# Patient Record
Sex: Female | Born: 1945 | Race: Black or African American | Hispanic: No | Marital: Married | Smoking: Never smoker
Health system: Southern US, Community
[De-identification: ages and names within clinical notes are randomized; demographics above are authoritative.]

## PROBLEM LIST (undated history)

## (undated) DIAGNOSIS — I10 Essential (primary) hypertension: Secondary | ICD-10-CM

## (undated) DIAGNOSIS — D75839 Thrombocytosis, unspecified: Secondary | ICD-10-CM

## (undated) DIAGNOSIS — I4819 Other persistent atrial fibrillation: Secondary | ICD-10-CM

## (undated) DIAGNOSIS — D473 Essential (hemorrhagic) thrombocythemia: Secondary | ICD-10-CM

## (undated) DIAGNOSIS — E785 Hyperlipidemia, unspecified: Secondary | ICD-10-CM

## (undated) DIAGNOSIS — I639 Cerebral infarction, unspecified: Secondary | ICD-10-CM

## (undated) HISTORY — DX: Essential (primary) hypertension: I10

## (undated) HISTORY — DX: Hyperlipidemia, unspecified: E78.5

## (undated) HISTORY — DX: Thrombocytosis, unspecified: D75.839

## (undated) HISTORY — DX: Other persistent atrial fibrillation: I48.19

## (undated) HISTORY — DX: Cerebral infarction, unspecified: I63.9

---

## 2005-10-04 ENCOUNTER — Emergency Department: Payer: Self-pay | Admitting: Emergency Medicine

## 2019-09-30 ENCOUNTER — Observation Stay (HOSPITAL_BASED_OUTPATIENT_CLINIC_OR_DEPARTMENT_OTHER)
Admit: 2019-09-30 | Discharge: 2019-09-30 | Disposition: A | Discharge status: Home / Self Care | Payer: Medicare Other | Attending: Nurse Practitioner | Admitting: Nurse Practitioner

## 2019-09-30 ENCOUNTER — Observation Stay: Payer: Medicare Other

## 2019-09-30 ENCOUNTER — Observation Stay
Admission: EM | Admit: 2019-09-30 | Discharge: 2019-10-01 | Disposition: A | Discharge status: Home / Self Care | Payer: Medicare Other | Attending: Internal Medicine | Admitting: Internal Medicine

## 2019-09-30 ENCOUNTER — Emergency Department: Payer: Medicare Other

## 2019-09-30 DIAGNOSIS — R55 Syncope and collapse: Secondary | ICD-10-CM

## 2019-09-30 DIAGNOSIS — D696 Thrombocytopenia, unspecified: Secondary | ICD-10-CM | POA: Insufficient documentation

## 2019-09-30 DIAGNOSIS — R778 Other specified abnormalities of plasma proteins: Secondary | ICD-10-CM

## 2019-09-30 DIAGNOSIS — D473 Essential (hemorrhagic) thrombocythemia: Secondary | ICD-10-CM | POA: Diagnosis not present

## 2019-09-30 DIAGNOSIS — R748 Abnormal levels of other serum enzymes: Secondary | ICD-10-CM | POA: Diagnosis not present

## 2019-09-30 DIAGNOSIS — G459 Transient cerebral ischemic attack, unspecified: Secondary | ICD-10-CM

## 2019-09-30 DIAGNOSIS — R531 Weakness: Secondary | ICD-10-CM | POA: Diagnosis not present

## 2019-09-30 DIAGNOSIS — I4891 Unspecified atrial fibrillation: Secondary | ICD-10-CM | POA: Insufficient documentation

## 2019-09-30 DIAGNOSIS — I10 Essential (primary) hypertension: Secondary | ICD-10-CM | POA: Diagnosis not present

## 2019-09-30 DIAGNOSIS — Z79899 Other long term (current) drug therapy: Secondary | ICD-10-CM | POA: Diagnosis not present

## 2019-09-30 DIAGNOSIS — I639 Cerebral infarction, unspecified: Principal | ICD-10-CM | POA: Insufficient documentation

## 2019-09-30 DIAGNOSIS — I340 Nonrheumatic mitral (valve) insufficiency: Secondary | ICD-10-CM

## 2019-09-30 DIAGNOSIS — R42 Dizziness and giddiness: Secondary | ICD-10-CM | POA: Diagnosis present

## 2019-09-30 DIAGNOSIS — Z20822 Contact with and (suspected) exposure to covid-19: Secondary | ICD-10-CM | POA: Insufficient documentation

## 2019-09-30 DIAGNOSIS — D75839 Thrombocytosis, unspecified: Secondary | ICD-10-CM

## 2019-09-30 LAB — ECHOCARDIOGRAM COMPLETE
AR max vel: 2.7 cm2
AV Area VTI: 2.6 cm2
AV Area mean vel: 2.48 cm2
AV Mean grad: 2 mmHg
AV Peak grad: 3.9 mmHg
Ao pk vel: 0.98 m/s
Area-P 1/2: 4.8 cm2
Height: 63 in
S' Lateral: 2.87 cm
Weight: 2672 oz

## 2019-09-30 LAB — CBC WITH DIFFERENTIAL/PLATELET
Abs Immature Granulocytes: 0.05 10*3/uL (ref 0.00–0.07)
Basophils Absolute: 0 10*3/uL (ref 0.0–0.1)
Basophils Relative: 0 %
Eosinophils Absolute: 0.2 10*3/uL (ref 0.0–0.5)
Eosinophils Relative: 3 %
HCT: 40.9 % (ref 36.0–46.0)
Hemoglobin: 13.6 g/dL (ref 12.0–15.0)
Immature Granulocytes: 1 %
Lymphocytes Relative: 15 %
Lymphs Abs: 1.3 10*3/uL (ref 0.7–4.0)
MCH: 29.8 pg (ref 26.0–34.0)
MCHC: 33.3 g/dL (ref 30.0–36.0)
MCV: 89.7 fL (ref 80.0–100.0)
Monocytes Absolute: 0.7 10*3/uL (ref 0.1–1.0)
Monocytes Relative: 8 %
Neutro Abs: 6.7 10*3/uL (ref 1.7–7.7)
Neutrophils Relative %: 73 %
Platelets: 1057 10*3/uL (ref 150–400)
RBC: 4.56 MIL/uL (ref 3.87–5.11)
RDW: 15.6 % — ABNORMAL HIGH (ref 11.5–15.5)
Smear Review: INCREASED
WBC: 9.1 10*3/uL (ref 4.0–10.5)
nRBC: 0 % (ref 0.0–0.2)

## 2019-09-30 LAB — COMPREHENSIVE METABOLIC PANEL
ALT: 24 U/L (ref 0–44)
AST: 28 U/L (ref 15–41)
Albumin: 4 g/dL (ref 3.5–5.0)
Alkaline Phosphatase: 80 U/L (ref 38–126)
Anion gap: 8 (ref 5–15)
BUN: 17 mg/dL (ref 8–23)
CO2: 26 mmol/L (ref 22–32)
Calcium: 9.5 mg/dL (ref 8.9–10.3)
Chloride: 106 mmol/L (ref 98–111)
Creatinine, Ser: 0.87 mg/dL (ref 0.44–1.00)
GFR calc Af Amer: >60 mL/min (ref >60)
GFR calc non Af Amer: >60 mL/min (ref >60)
Glucose, Bld: 117 mg/dL — ABNORMAL HIGH (ref 70–99)
Potassium: 4.2 mmol/L (ref 3.5–5.1)
Sodium: 140 mmol/L (ref 135–145)
Total Bilirubin: 1.4 mg/dL — ABNORMAL HIGH (ref 0.3–1.2)
Total Protein: 7.1 g/dL (ref 6.5–8.1)

## 2019-09-30 LAB — LACTATE DEHYDROGENASE: LDH: 375 U/L — ABNORMAL HIGH (ref 98–192)

## 2019-09-30 LAB — TROPONIN I (HIGH SENSITIVITY)
Troponin I (High Sensitivity): 19 ng/L — ABNORMAL HIGH (ref <18)
Troponin I (High Sensitivity): 21 ng/L — ABNORMAL HIGH (ref <18)
Troponin I (High Sensitivity): 27 ng/L — ABNORMAL HIGH (ref <18)
Troponin I (High Sensitivity): 31 ng/L — ABNORMAL HIGH (ref <18)

## 2019-09-30 LAB — SARS CORONAVIRUS 2 BY RT PCR (HOSPITAL ORDER, PERFORMED IN ~~LOC~~ HOSPITAL LAB): SARS Coronavirus 2: NEGATIVE

## 2019-09-30 LAB — MAGNESIUM: Magnesium: 2 mg/dL (ref 1.7–2.4)

## 2019-09-30 LAB — PATHOLOGIST SMEAR REVIEW

## 2019-09-30 LAB — TSH: TSH: 2.712 u[IU]/mL (ref 0.350–4.500)

## 2019-09-30 IMAGING — CT CT HEAD W/O CM
3 series · 16 of 46 positions shown, 19 images · non-contrast
Comparison: None.

CLINICAL DATA: TIA.  Syncopal episode today.

EXAM:
CT HEAD WITHOUT CONTRAST
TECHNIQUE: Contiguous axial images were obtained from the base of the skull
through the vertex without intravenous contrast.

[Series 3: head wo · axial · 0.43mm/px · z∈[-108,+12]mm · 10 of 29 slices shown, 13 images]
[im 3/29  brain]
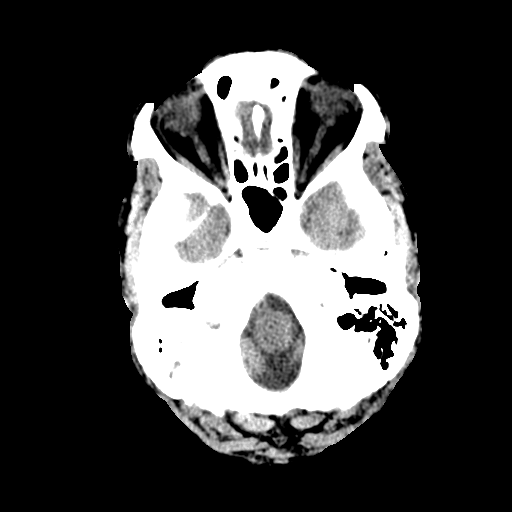
[im 3/29  bone]
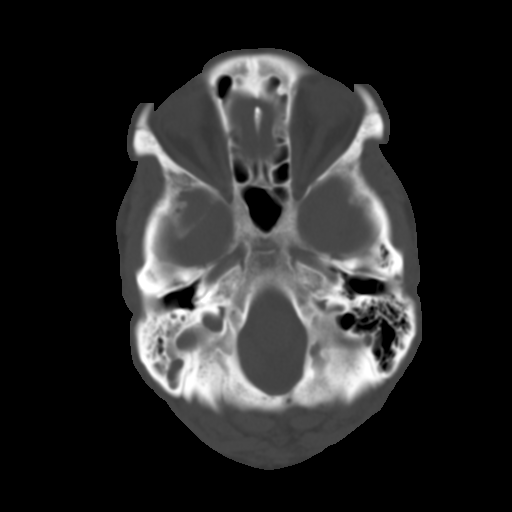
[im 6/29  brain]
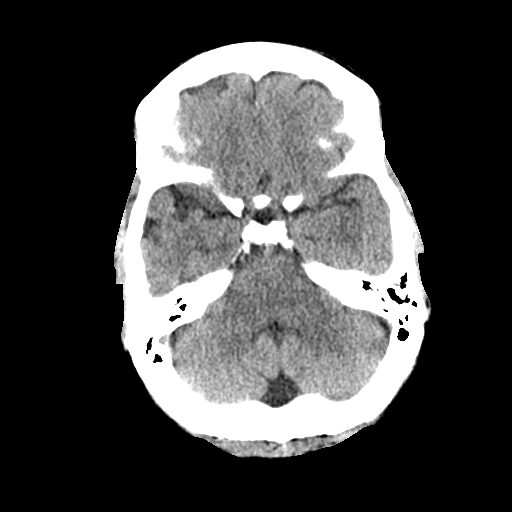
[im 8/29  brain]
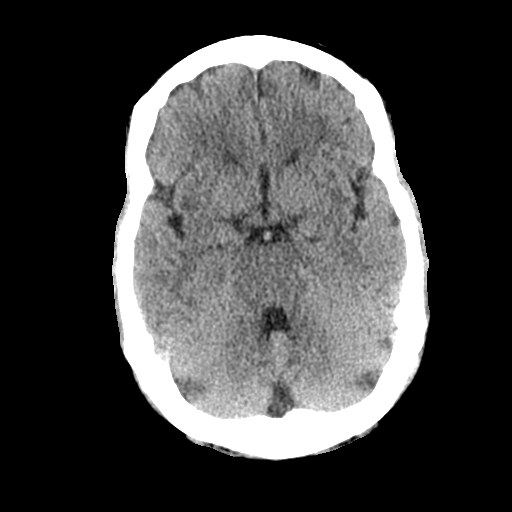
[im 11/29  brain]
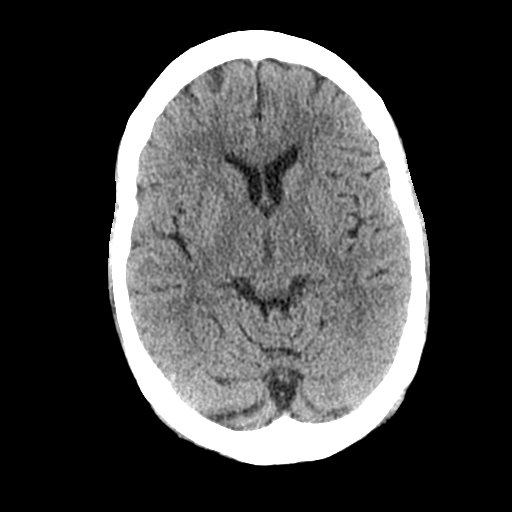
[im 14/29  brain]
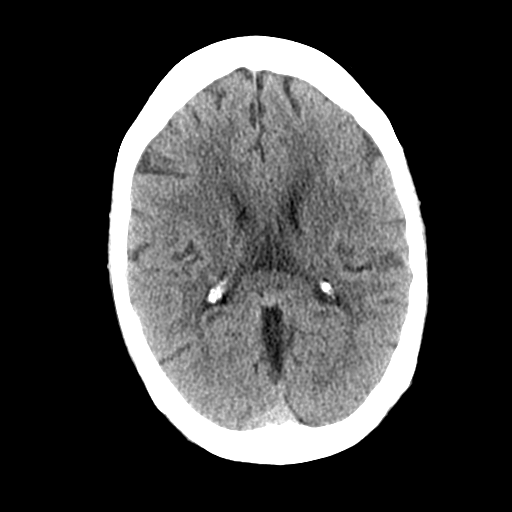
[im 14/29  bone]
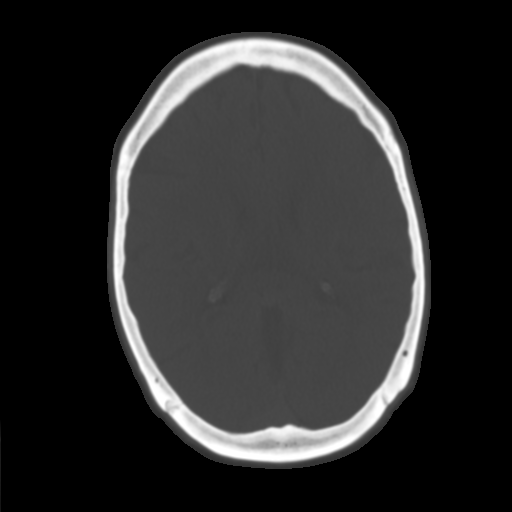
[im 16/29  brain]
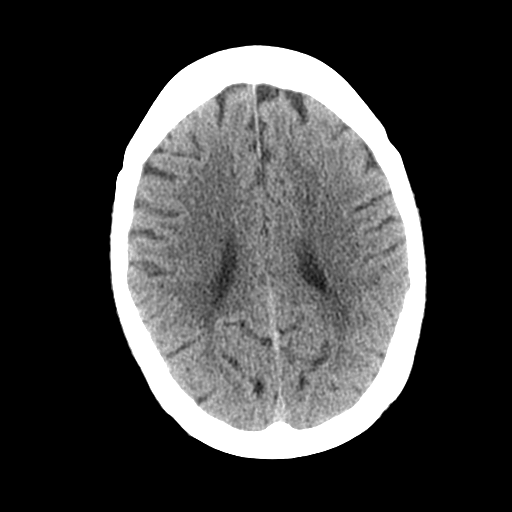
[im 19/29  brain]
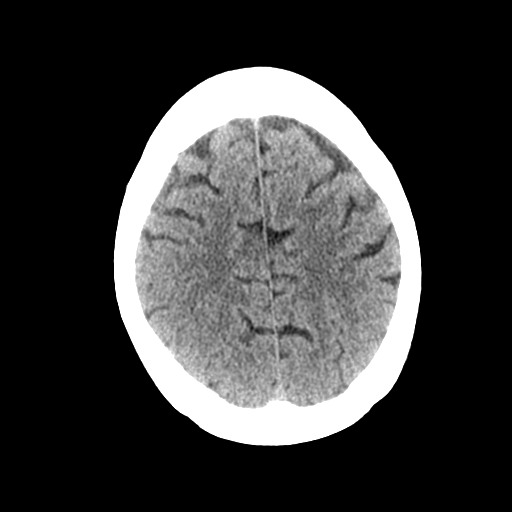
[im 22/29  brain]
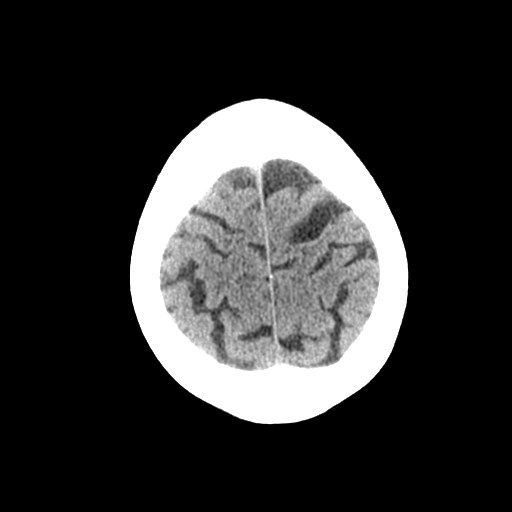
[im 24/29  brain]
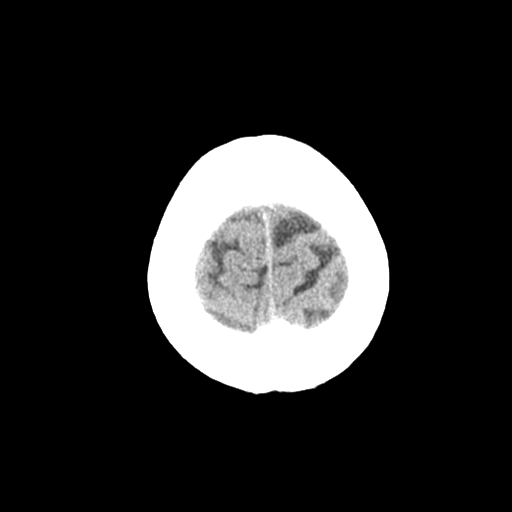
[im 24/29  bone]
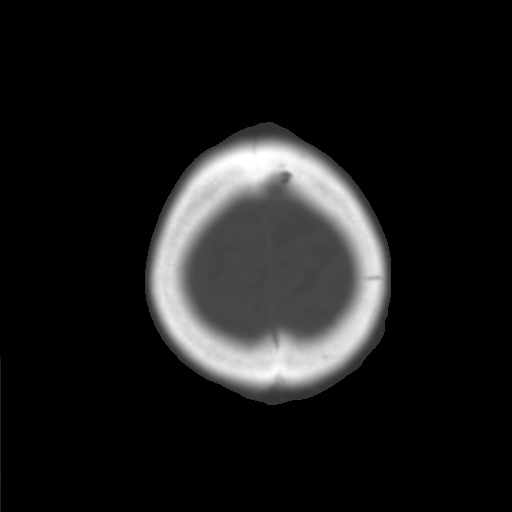
[im 27/29  brain]
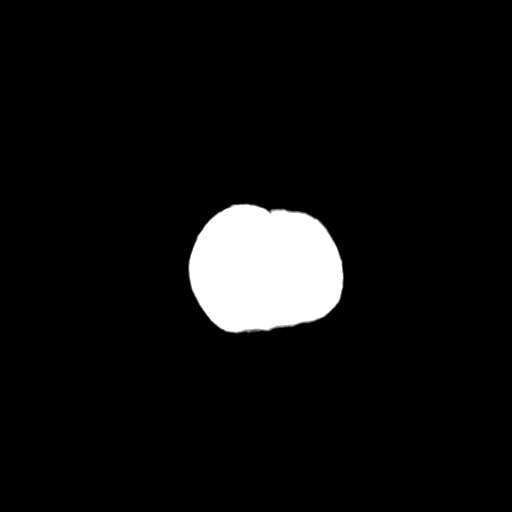

[Series 4: coronal soft tissue · coronal · 0.30mm/px · 3 of 65 slices shown]
[im 22/65  brain]
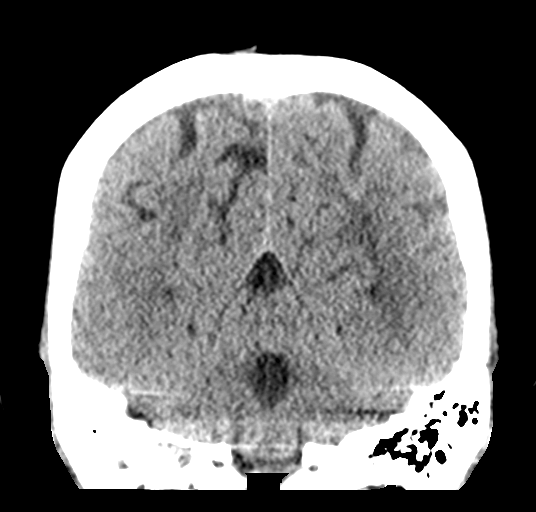
[im 29/65  brain]
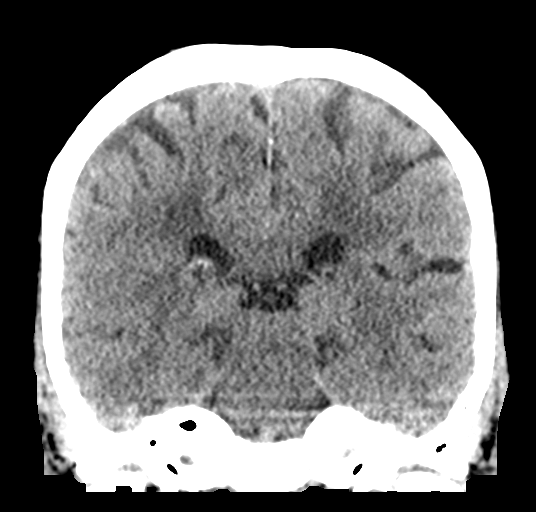
[im 36/65  brain]
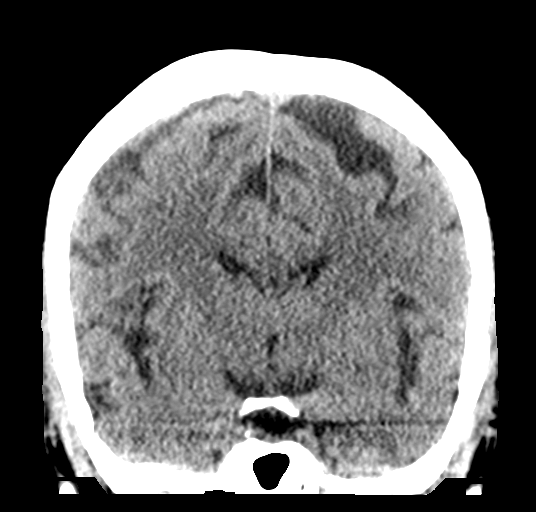

[Series 5: sagittal soft tissue · sagittal · 0.30mm/px · 3 of 51 slices shown]
[im 17/51  brain]
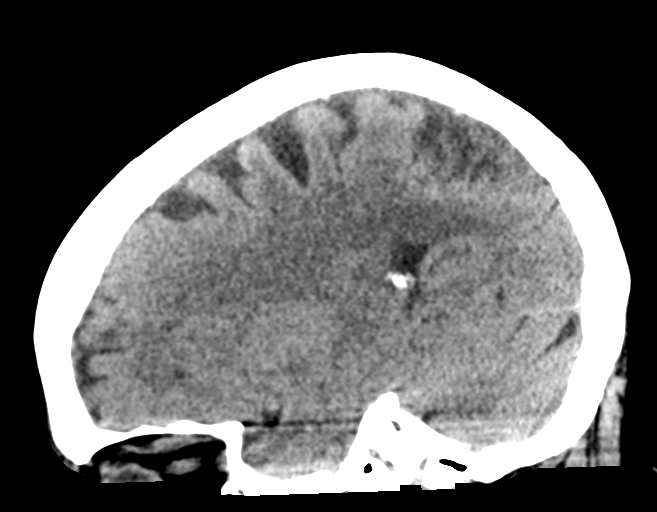
[im 26/51  brain]
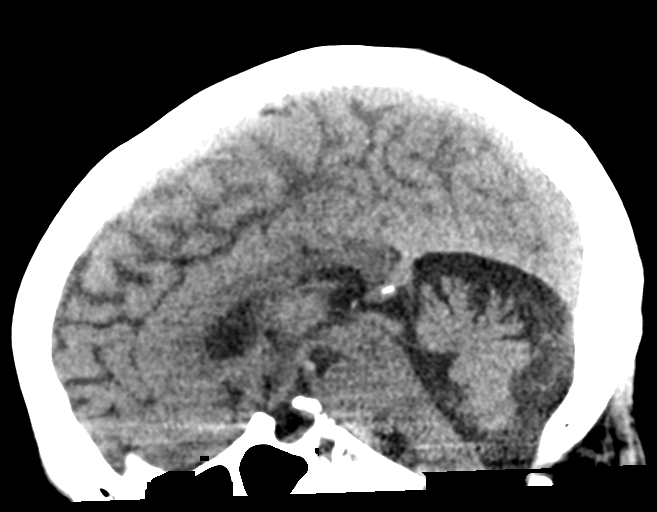
[im 34/51  brain]
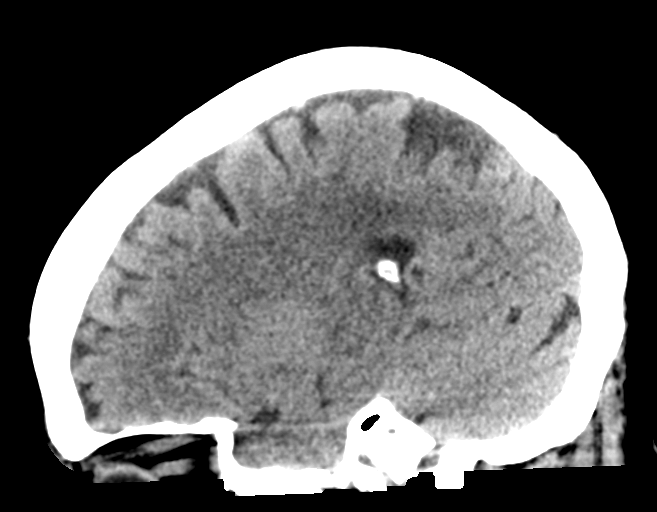

[16 of 46 positions shown; findings below may reference images not displayed]

FINDINGS: Brain: Mild generalized white matter hypoattenuation is likely
within normal limits for age. No acute infarct, hemorrhage, or mass
lesion is present. The ventricles are of normal size. No significant
extraaxial fluid collection is present. The brainstem and cerebellum
are within normal limits.

Vascular: No hyperdense vessel or unexpected calcification.

Skull: Calvarium is intact. No focal lytic or blastic lesions are
present. No significant extracranial soft tissue lesion is present.

Sinuses/Orbits: The paranasal sinuses and mastoid air cells are
clear. The globes and orbits are within normal limits.
IMPRESSION: Normal CT appearance of the brain for age.

## 2019-09-30 MED ORDER — ENOXAPARIN SODIUM 40 MG/0.4ML ~~LOC~~ SOLN: 40.0000 mg | SUBCUTANEOUS | Status: DC

## 2019-09-30 MED ORDER — ASPIRIN 81 MG PO CHEW
324.0000 mg | CHEWABLE_TABLET | Freq: Once | ORAL | Status: AC
  Administered 2019-09-30: 324 mg via ORAL
  Filled 2019-09-30: qty 4

## 2019-09-30 MED ORDER — ONDANSETRON HCL 4 MG/2ML IJ SOLN: 4.0000 mg | Freq: Three times a day (TID) | INTRAMUSCULAR | Status: DC | PRN

## 2019-09-30 MED ORDER — METOPROLOL TARTRATE 25 MG PO TABS
25.0000 mg | ORAL_TABLET | Freq: Two times a day (BID) | ORAL | Status: DC
  Administered 2019-09-30 (×2): 25 mg via ORAL
  Filled 2019-09-30: qty 1

## 2019-09-30 MED ORDER — ACETAMINOPHEN 325 MG PO TABS: 650.0000 mg | ORAL_TABLET | Freq: Four times a day (QID) | ORAL | Status: DC | PRN

## 2019-09-30 MED ORDER — HYDROXYUREA 500 MG PO CAPS
500.0000 mg | ORAL_CAPSULE | Freq: Two times a day (BID) | ORAL | Status: DC
  Administered 2019-10-01 (×2): 500 mg via ORAL
  Filled 2019-09-30 (×3): qty 1

## 2019-09-30 MED ORDER — STROKE: EARLY STAGES OF RECOVERY BOOK: Freq: Once | Status: AC

## 2019-09-30 MED ORDER — SODIUM CHLORIDE 0.9 % IV SOLN: INTRAVENOUS | Status: DC

## 2019-09-30 MED ORDER — APIXABAN 5 MG PO TABS
5.0000 mg | ORAL_TABLET | Freq: Two times a day (BID) | ORAL | Status: DC
  Administered 2019-09-30 – 2019-10-01 (×2): 5 mg via ORAL
  Filled 2019-09-30 (×2): qty 1

## 2019-09-30 MED ORDER — ATORVASTATIN CALCIUM 20 MG PO TABS
40.0000 mg | ORAL_TABLET | Freq: Every day | ORAL | Status: DC
  Administered 2019-09-30 – 2019-10-01 (×2): 40 mg via ORAL
  Filled 2019-09-30 (×2): qty 2

## 2019-09-30 MED ORDER — ASPIRIN EC 325 MG PO TBEC: 325.0000 mg | DELAYED_RELEASE_TABLET | Freq: Every day | ORAL | Status: DC

## 2019-09-30 MED ORDER — SENNOSIDES-DOCUSATE SODIUM 8.6-50 MG PO TABS: 1.0000 | ORAL_TABLET | Freq: Every evening | ORAL | Status: DC | PRN

## 2019-09-30 NOTE — ED Provider Notes (Signed)
[facility] Emergency Department Provider Note  ____________________________________________   First MD Initiated Contact with Patient 09/30/19 [phone]     (approximate)  I have reviewed the triage vital signs and the nursing notes.   HISTORY  Chief Complaint Near Syncope    HPI Kristy Sullivan is a 74 y.o. female  Here with transient left sided weakness and near syncope. Pt states she woke up in her usual staet of health this AM. She went walking with a friend, as she does on most days. She had walked approx 1 mile and was returning home. She states when walking back inside, she experienced acute onset of left-sided leg and arm weakness, lightheadedness, and sensation she was going to fall. She fell down to the ground but did not hit her head and does not believe she hit her head. EMS was called and found pt in new onset AFib (no known history). She was given lopressor IV w/ improvement in rate. No chest pain. No other complaints. She now has no focal numbness or weakness. No vision changes.        History reviewed. No pertinent past medical history.  There are no problems to display for this patient.   History reviewed. No pertinent surgical history.  Prior to Admission medications   Not on File    Allergies Patient has no known allergies.  Family History  Family history unknown: Yes    Social History Social History   Tobacco Use  . Smoking status: Never Smoker  . Smokeless tobacco: Never Used  Substance Use Topics  . Alcohol use: Never  . Drug use: Never    Review of Systems  Review of Systems  Constitutional: Positive for fatigue. Negative for chills and fever.  HENT: Negative for sore throat.   Respiratory: Negative for shortness of breath.   Cardiovascular: Negative for chest pain.  Gastrointestinal: Negative for abdominal pain.  Genitourinary: Negative for flank pain.  Musculoskeletal: Negative for neck pain.  Skin: Negative  for rash and wound.  Allergic/Immunologic: Negative for immunocompromised state.  Neurological: Positive for syncope, weakness and light-headedness. Negative for numbness.  Hematological: Does not bruise/bleed easily.  All other systems reviewed and are negative.    ____________________________________________  PHYSICAL EXAM:      VITAL SIGNS: ED Triage Vitals  Enc Vitals Group     BP      Pulse      Resp      Temp      Temp src      SpO2      Weight      Height      Head Circumference      Peak Flow      Pain Score      Pain Loc      Pain Edu?      Excl. in [place]?      Physical Exam Vitals and nursing note reviewed.  Constitutional:      General: She is not in acute distress.    Appearance: She is well-developed.  HENT:     Head: Normocephalic and atraumatic.  Eyes:     Conjunctiva/sclera: Conjunctivae normal.  Cardiovascular:     Rate and Rhythm: Normal rate and regular rhythm.     Heart sounds: Normal heart sounds. No murmur heard.  No friction rub.  Pulmonary:     Effort: Pulmonary effort is normal. No respiratory distress.     Breath sounds: Normal breath sounds. No wheezing or rales.  Abdominal:     General: There is no distension.     Palpations: Abdomen is soft.     Tenderness: There is no abdominal tenderness.  Musculoskeletal:     Cervical back: Neck supple.  Skin:    General: Skin is warm.     Capillary Refill: Capillary refill takes less than 2 seconds.  Neurological:     Mental Status: She is alert and oriented to person, place, and time.     GCS: GCS eye subscore is 4. GCS verbal subscore is 5. GCS motor subscore is 6.     Cranial Nerves: Cranial nerves are intact.     Sensory: Sensation is intact.     Motor: Motor function is intact. No abnormal muscle tone.     Gait: Gait is intact.       ____________________________________________   LABS (all labs ordered are listed, but only abnormal results are displayed)  Labs Reviewed    COMPREHENSIVE METABOLIC PANEL - Abnormal; Notable for the following components:      Result Value   Glucose, Bld 117 (*)    Total Bilirubin 1.4 (*)    All other components within normal limits  TROPONIN I (HIGH SENSITIVITY) - Abnormal; Notable for the following components:   Troponin I (High Sensitivity) 19 (*)    All other components within normal limits  MAGNESIUM  TSH  CBC WITH DIFFERENTIAL/PLATELET    ____________________________________________  EKG: Atrial fibrillation, VR 98. QRS narrow, 85. QTc 430. Rate controlled, non-specific TWC but no ST elevations. ________________________________________  RADIOLOGY All imaging, including plain films, CT scans, and ultrasounds, independently reviewed by me, and interpretations confirmed via formal radiology reads.  ED MD interpretation:   CT Head: [place]  Official radiology report(s): CT Head Wo Contrast  Result Date: 09/30/2019 CLINICAL DATA:  TIA.  Syncopal episode today. EXAM: CT HEAD WITHOUT CONTRAST TECHNIQUE: Contiguous axial images were obtained from the base of the skull through the vertex without intravenous contrast. COMPARISON:  None. FINDINGS: Brain: Mild generalized white matter hypoattenuation is likely within normal limits for age. No acute infarct, hemorrhage, or mass lesion is present. The ventricles are of normal size. No significant extraaxial fluid collection is present. The brainstem and cerebellum are within normal limits. Vascular: No hyperdense vessel or unexpected calcification. Skull: Calvarium is intact. No focal lytic or blastic lesions are present. No significant extracranial soft tissue lesion is present. Sinuses/Orbits: The paranasal sinuses and mastoid air cells are clear. The globes and orbits are within normal limits. IMPRESSION: Normal CT appearance of the brain for age. Electronically Signed   By: San Morelle M.D.   On: 09/30/2019 09:11     ____________________________________________  PROCEDURES   Procedure(s) performed (including Critical Care):  Procedures  ____________________________________________  INITIAL IMPRESSION / MDM / [place] / ED COURSE  As part of my medical decision making, I reviewed the following data within the [place] notes reviewed and incorporated, Old chart reviewed, Notes from prior ED visits, and [place] Controlled Substance Database       *Kristy Sullivan was evaluated in Emergency Department on 09/30/2019 for the symptoms described in the history of present illness. She was evaluated in the context of the global COVID-19 pandemic, which necessitated consideration that the patient might be at risk for infection with the SARS-CoV-2 virus that causes COVID-19. Institutional protocols and algorithms that pertain to the evaluation of patients at risk for COVID-19 are in a state of rapid change based on information  released by regulatory bodies including the CDC and federal and state organizations. These policies and algorithms were followed during the patient's care in the ED.  Some ED evaluations and interventions may be delayed as a result of limited staffing during the pandemic.*     Medical Decision Making:  74 yo F here with near syncope and transient L sided weakness. Pt in new-onset AFib on arrival, unclear how long she has been in this as she is asymptomatic. DDx includes: transient AFib RVR causing near syncope, TIA, small CVA. CT Head shows NAICA. Labs overall unremarkable. Trop minimally elevated at 19 - suspect this is 2/2 her afib, no CP currently. No ST elevations on EKG. Will admit for syncope/TIA evaluation.  ____________________________________________  FINAL CLINICAL IMPRESSION(S) / ED DIAGNOSES  Final diagnoses:  Near syncope  TIA (transient ischemic attack)     MEDICATIONS GIVEN DURING THIS VISIT:  Medications - No data to  display   ED Discharge Orders    None       Note:  This document was prepared using Dragon voice recognition software and may include unintentional dictation errors.   Duffy Bruce, MD 09/30/19 [phone]

## 2019-09-30 NOTE — ED Triage Notes (Signed)
Patient to ED via [facility] c/o near syncope. Per EMS patient was walking this AM for exercise, and after about a mile reports feeling "weak and dizzy" and almost fell, but made it to her car to sit down. EMS reports patient had a new onset of afib (no previous history) with rates jumping between 110-201 bpm. EMS administered Metoprolol 2.5mg  IV. Rate converted to 80-90s. Patient arrived denying pain, denying feeling weak or dizzy. No acute distress at this time.

## 2019-09-30 NOTE — Consult Note (Signed)
Reason for Consult: syncope  Requesting Physician: Dr. Blaine Hamper   CC: Syncope   HPI: Kristy Sullivan is an 74 y.o. female with no past medical hx and not on any medications presents with with transient left sided weakness and near syncope. Pt states she woke up in her usual stateof health this AM. She went walking with a friend, as she does on most days. She had walked approx 1 mile and was returning home. She states when walking back inside, she experienced acute onset of left-sided leg and arm weakness, lightheadedness, and sensation she was going to fall. She fell down to the ground but did not hit her head and does not believe she hit her head. No reported LOC. She was found to be in A-fib with RVR.  Now back to baseline.   History reviewed. No pertinent past medical history.  History reviewed. No pertinent surgical history.  Family History  Family history unknown: Yes    Social History:  reports that she has never smoked. She has never used smokeless tobacco. She reports that she does not drink alcohol and does not use drugs.  No Known Allergies  Medications: I have reviewed the patient's current medications.  ROS: History obtained from the patient  General ROS: negative for - chills, fatigue, fever, night sweats, weight gain or weight loss Psychological ROS: negative for - behavioral disorder, hallucinations, memory difficulties, mood swings or suicidal ideation Ophthalmic ROS: negative for - blurry vision, double vision, eye pain or loss of vision ENT ROS: negative for - epistaxis, nasal discharge, oral lesions, sore throat, tinnitus or vertigo Allergy and Immunology ROS: negative for - hives or itchy/watery eyes Hematological and Lymphatic ROS: negative for - bleeding problems, bruising or swollen lymph nodes Endocrine ROS: negative for - galactorrhea, hair pattern changes, polydipsia/polyuria or temperature intolerance Respiratory ROS: negative for - cough, hemoptysis, shortness  of breath or wheezing Cardiovascular ROS: negative for - chest pain, dyspnea on exertion, edema or irregular heartbeat Gastrointestinal ROS: negative for - abdominal pain, diarrhea, hematemesis, nausea/vomiting or stool incontinence Genito-Urinary ROS: negative for - dysuria, hematuria, incontinence or urinary frequency/urgency Musculoskeletal ROS: negative for - joint swelling or muscular weakness Neurological ROS: as noted in HPI Dermatological ROS: negative for rash and skin lesion changes  Physical Examination: Blood pressure (!) 168/111, pulse 92, temperature 98.7 F (37.1 C), temperature source Oral, resp. rate 19, height 5\' 3"  (1.6 m), weight 75.8 kg, SpO2 99 %.  Neurological Examination   Mental Status: Alert, oriented, thought content appropriate.  Speech fluent without evidence of aphasia.  Able to follow 3 step commands without difficulty. Cranial Nerves: II: Discs flat bilaterally; Visual fields grossly normal, pupils equal, round, reactive to light and accommodation III,IV, VI: ptosis not present, extra-ocular motions intact bilaterally V,VII: smile symmetric, facial light touch sensation normal bilaterally VIII: hearing normal bilaterally IX,X: gag reflex present XI: bilateral shoulder shrug XII: midline tongue extension Motor: Right : Upper extremity   5/5    Left:     Upper extremity   5/5  Lower extremity   5/5     Lower extremity   5/5 Tone and bulk:normal tone throughout; no atrophy noted Sensory: Pinprick and light touch intact throughout, bilaterally Deep Tendon Reflexes: 1+ and symmetric throughout Plantars: Right: downgoing   Left: downgoing Cerebellar: normal finger-to-nose      Laboratory Studies:   Basic Metabolic Panel: Recent Labs  Lab 09/30/19 0922  NA 140  K 4.2  CL 106  CO2 26  GLUCOSE  117*  BUN 17  CREATININE 0.87  CALCIUM 9.5  MG 2.0    Liver Function Tests: Recent Labs  Lab 09/30/19 0922  AST 28  ALT 24  ALKPHOS 80  BILITOT  1.4*  PROT 7.1  ALBUMIN 4.0   No results for input(s): LIPASE, AMYLASE in the last 168 hours. No results for input(s): AMMONIA in the last 168 hours.  CBC: Recent Labs  Lab 09/30/19 0922  WBC 9.1  NEUTROABS 6.7  HGB 13.6  HCT 40.9  MCV 89.7  PLT 1,057*    Cardiac Enzymes: No results for input(s): CKTOTAL, CKMB, CKMBINDEX, TROPONINI in the last 168 hours.  BNP: Invalid input(s): POCBNP  CBG: No results for input(s): GLUCAP in the last 168 hours.  Microbiology: No results found for this or any previous visit.  Coagulation Studies: No results for input(s): LABPROT, INR in the last 72 hours.  Urinalysis: No results for input(s): COLORURINE, LABSPEC, PHURINE, GLUCOSEU, HGBUR, BILIRUBINUR, KETONESUR, PROTEINUR, UROBILINOGEN, NITRITE, LEUKOCYTESUR in the last 168 hours.  Invalid input(s): APPERANCEUR  Lipid Panel:  No results found for: CHOL, TRIG, HDL, CHOLHDL, VLDL, LDLCALC  HgbA1C: No results found for: HGBA1C  Urine Drug Screen:  No results found for: LABOPIA, COCAINSCRNUR, LABBENZ, AMPHETMU, THCU, LABBARB  Alcohol Level: No results for input(s): ETH in the last 168 hours.  Other results: EKG: a-fib with RVR.  Imaging: CT Head Wo Contrast  Result Date: 09/30/2019 CLINICAL DATA:  TIA.  Syncopal episode today. EXAM: CT HEAD WITHOUT CONTRAST TECHNIQUE: Contiguous axial images were obtained from the base of the skull through the vertex without intravenous contrast. COMPARISON:  None. FINDINGS: Brain: Mild generalized white matter hypoattenuation is likely within normal limits for age. No acute infarct, hemorrhage, or mass lesion is present. The ventricles are of normal size. No significant extraaxial fluid collection is present. The brainstem and cerebellum are within normal limits. Vascular: No hyperdense vessel or unexpected calcification. Skull: Calvarium is intact. No focal lytic or blastic lesions are present. No significant extracranial soft tissue lesion is  present. Sinuses/Orbits: The paranasal sinuses and mastoid air cells are clear. The globes and orbits are within normal limits. IMPRESSION: Normal CT appearance of the brain for age. Electronically Signed   By: San Morelle M.D.   On: 09/30/2019 09:11     Assessment/Plan:   74 y.o. female with no past medical hx and not on any medications presents with with transient left sided weakness and near syncope. Pt states she woke up in her usual stateof health this AM. She went walking with a friend, as she does on most days. She had walked approx 1 mile and was returning home. She states when walking back inside, she experienced acute onset of left-sided leg and arm weakness, lightheadedness, and sensation she was going to fall. She fell down to the ground but did not hit her head and does not believe she hit her head. No reported LOC. She was found to be in A-fib with RVR.  Now back to baseline.    - A-fib rate control as per primary team along with thrombocytosis for 1,057 - transient L sided weakness that resolved. Possibly TIA or small stroke in setting of thrombocytosis and A-fib - MRI brain.  Potentially starting anticoagulation after MRI is done - pt/ot - CTH no acute abnormalities - hydration    @LRSIGN @ 09/30/2019, 11:47 AM

## 2019-09-30 NOTE — Progress Notes (Signed)
*  PRELIMINARY RESULTS* Echocardiogram 2D Echocardiogram has been performed.  Sherrie Sport 09/30/2019, 2:13 PM

## 2019-09-30 NOTE — ED Notes (Signed)
PT to MRI

## 2019-09-30 NOTE — Consult Note (Signed)
[facility] CONSULT NOTE  Patient Care Team: Patient, No Pcp Per as PCP - General ([place]) Wellington Hampshire, MD as PCP - Cardiology (Cardiology)  CHIEF COMPLAINTS/PURPOSE OF CONSULTATION: Elevated platelets  HISTORY OF PRESENTING ILLNESS:  Kristy Sullivan 74 y.o.  female no significant past medical history [does not follow-up with doctors] is currently admitted to hospital for possible TIA.   Patient states that she woke up in usual state of health; and went for a walk.  Returning for walks noted acute onset of left-sided weakness and upper extremity weakness and a presyncopal episode.  She slouched to the ground but did not hit her head.  No loss of consciousness or seizures.  In the emergency room she is noted to be in A. fib with RVR;  and blood work shows elevated platelets of 1057; hemoglobin white count normal.  CT scan brain noncontrast negative oncology is been consulted for further evaluation recommendations.   Review of Systems  Constitutional: Positive for weight loss (intentional). Negative for chills, diaphoresis, fever and malaise/fatigue.  HENT: Negative for nosebleeds and sore throat.   Eyes: Negative for double vision.  Respiratory: Negative for cough, hemoptysis, sputum production, shortness of breath and wheezing.   Cardiovascular: Negative for chest pain, palpitations, orthopnea and leg swelling.  Gastrointestinal: Negative for abdominal pain, blood in stool, constipation, diarrhea, heartburn, melena, nausea and vomiting.  Genitourinary: Negative for dysuria, frequency and urgency.  Musculoskeletal: Negative for back pain and joint pain.  Skin: Negative.  Negative for itching and rash.  Neurological: Positive for focal weakness. Negative for dizziness, tingling, weakness and headaches.  Endo/Heme/Allergies: Does not bruise/bleed easily.  Psychiatric/Behavioral: Negative for depression. The patient is not nervous/anxious and does not have  insomnia.      MEDICAL HISTORY:  History reviewed. No pertinent past medical history.  SURGICAL HISTORY: History reviewed. No pertinent surgical history.  SOCIAL HISTORY: Social History   Socioeconomic History  . Marital status: Married    Spouse name: Not on file  . Number of children: Not on file  . Years of education: Not on file  . Highest education level: Not on file  Occupational History  . Not on file  Tobacco Use  . Smoking status: Never Smoker  . Smokeless tobacco: Never Used  Substance and Sexual Activity  . Alcohol use: Never  . Drug use: Never  . Sexual activity: Never  Other Topics Concern  . 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    Social Determinants of Health   Financial Resource Strain:   . Difficulty of Paying Living Expenses:   Food Insecurity:   . Worried About Charity fundraiser in the Last Year:   . Arboriculturist in the Last Year:   Transportation Needs:   . Film/video editor (Medical):   Marland Kitchen Lack of Transportation (Non-Medical):   Physical Activity:   . Days of Exercise per Week:   . Minutes of Exercise per Session:   Stress:   . Feeling of Stress :   Social Connections:   . Frequency of Communication with Friends and Family:   . Frequency of Social Gatherings with Friends and Family:   . Attends Religious Services:   . Active Member of Clubs or Organizations:   . Attends Archivist Meetings:   Marland Kitchen Marital Status:   Intimate Partner Violence:   . Fear of  Current or Ex-Partner:   . Emotionally Abused:   Marland Kitchen Physically Abused:   . Sexually Abused:     FAMILY HISTORY: Family History  Problem Relation Age of Onset  . High blood pressure Mother     ALLERGIES:  has No Known Allergies.  MEDICATIONS:  Current Facility-Administered Medications  Medication Dose Route Frequency Provider Last Rate Last Admin  .  stroke: mapping our early  stages of recovery book   Does not apply Once Ivor Costa, MD      . 0.9 %  sodium chloride infusion   Intravenous Continuous Ivor Costa, MD 75 mL/hr at 09/30/19 1206 New Bag at 09/30/19 1206  . acetaminophen (TYLENOL) tablet 650 mg  650 mg Oral Q6H PRN Ivor Costa, MD      . Derrill Memo ON 10/01/2019] aspirin EC tablet 325 mg  325 mg Oral Daily Ivor Costa, MD      . enoxaparin (LOVENOX) injection 40 mg  40 mg Subcutaneous Q24H Ivor Costa, MD      . hydroxyurea (HYDREA) capsule 500 mg  500 mg Oral BID Charlaine Dalton R, MD      . metoprolol tartrate (LOPRESSOR) tablet 25 mg  25 mg Oral BID Ivor Costa, MD   25 mg at 09/30/19 1325  . ondansetron (ZOFRAN) injection 4 mg  4 mg Intravenous Q8H PRN Ivor Costa, MD      . senna-docusate (Senokot-S) tablet 1 tablet  1 tablet Oral QHS PRN Ivor Costa, MD       No current outpatient medications on file.      Marland Kitchen  PHYSICAL EXAMINATION:  Vitals:   09/30/19 1030 09/30/19 1311  BP: (!) 168/111 (!) 162/81  Pulse: 92 91  Resp:  21  Temp:    SpO2: 99% 100%   Filed Weights   09/30/19 0853  Weight: 167 lb (75.8 kg)    Physical Exam Constitutional:      Comments: Patient appears in no acute distress.  Accompanied by husband.  HENT:     Head: Normocephalic and atraumatic.     Mouth/Throat:     Pharynx: No oropharyngeal exudate.  Eyes:     Pupils: Pupils are equal, round, and reactive to light.  Cardiovascular:     Rate and Rhythm: Normal rate and regular rhythm.  Pulmonary:     Effort: No respiratory distress.     Breath sounds: No wheezing.  Abdominal:     General: Bowel sounds are normal. There is no distension.     Palpations: Abdomen is soft. There is no mass.     Tenderness: There is no abdominal tenderness. There is no guarding or rebound.  Musculoskeletal:        General: No tenderness. Normal range of motion.     Cervical back: Normal range of motion and neck supple.  Skin:    General: Skin is warm.  Neurological:     General: No  focal deficit present.     Mental Status: She is alert and oriented to person, place, and time.  Psychiatric:        Mood and Affect: Affect normal.      LABORATORY DATA:  I have reviewed the data as listed Lab Results  Component Value Date   WBC 9.1 09/30/2019   HGB 13.6 09/30/2019   HCT 40.9 09/30/2019   MCV 89.7 09/30/2019   PLT 1,057 (HH) 09/30/2019   Recent Labs    09/30/19 0922  NA 140  K 4.2  CL 106  CO2 26  GLUCOSE 117*  BUN 17  CREATININE 0.87  CALCIUM 9.5  GFRNONAA >60  GFRAA >60  PROT 7.1  ALBUMIN 4.0  AST 28  ALT 24  ALKPHOS 80  BILITOT 1.4*    RADIOGRAPHIC STUDIES: I have personally reviewed the radiological images as listed and agreed with the findings in the report. CT Head Wo Contrast  Result Date: 09/30/2019 CLINICAL DATA:  TIA.  Syncopal episode today. EXAM: CT HEAD WITHOUT CONTRAST TECHNIQUE: Contiguous axial images were obtained from the base of the skull through the vertex without intravenous contrast. COMPARISON:  None. FINDINGS: Brain: Mild generalized white matter hypoattenuation is likely within normal limits for age. No acute infarct, hemorrhage, or mass lesion is present. The ventricles are of normal size. No significant extraaxial fluid collection is present. The brainstem and cerebellum are within normal limits. Vascular: No hyperdense vessel or unexpected calcification. Skull: Calvarium is intact. No focal lytic or blastic lesions are present. No significant extracranial soft tissue lesion is present. Sinuses/Orbits: The paranasal sinuses and mastoid air cells are clear. The globes and orbits are within normal limits. IMPRESSION: Normal CT appearance of the brain for age. Electronically Signed   By: San Morelle M.D.   On: 09/30/2019 09:11   MR BRAIN WO CONTRAST  Result Date: 09/30/2019 CLINICAL DATA:  Left-sided weakness and near syncope. EXAM: MRI HEAD WITHOUT CONTRAST TECHNIQUE: Multiplanar, multiecho pulse sequences of the brain  and surrounding structures were obtained without intravenous contrast. 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 No confluent or large vessel territory infarction. No mass lesion, hemorrhage, hydrocephalus or extra-axial collection. Background pattern of moderate chronic small-vessel ischemic changes throughout both cerebral hemispheres. Vascular: Major vessels at the base of the brain show flow. Skull and upper cervical spine: Negative Sinuses/Orbits: Clear/normal Other: Right mastoid effusion. 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 No confluent infarction. No swelling or hemorrhage. Moderate chronic small-vessel ischemic changes elsewhere throughout the cerebral hemispheric white matter. Electronically Signed   By: Nelson Chimes M.D.   On: 09/30/2019 16:02   ECHOCARDIOGRAM COMPLETE  Result Date: 09/30/2019    ECHOCARDIOGRAM REPORT   Patient Name:   CRETA DORAME Date of Exam: 09/30/2019 Medical Rec #:  [id number]        Height:       63.0 in Accession #:    [id number]       Weight:       167.0 lb Date of Birth:  [birth year]        BSA:          1.791 m Patient Age:    40 years         BP:           102/81 mmHg Patient Gender: F                HR:           91 bpm. Exam Location:  [facility] Procedure: 2D Echo, Color Doppler and Cardiac Doppler Indications:     Atrial fibrillation 427.31  History:         Patient has no prior history of Echocardiogram examinations. No  medical history on file.  Sonographer:     Sherrie Sport RDCS (AE) Referring Phys:  [id number] [place] Diagnosing Phys: Kathlyn Sacramento MD   Sonographer Comments: Suboptimal apical window. IMPRESSIONS  1. Left ventricular ejection fraction, by estimation, is 50 to 55%. The left ventricle has low normal function. The left ventricle has no regional wall motion abnormalities. There is mild left ventricular hypertrophy. Left ventricular diastolic parameters are indeterminate.  2. Right ventricular systolic function is normal. The right ventricular size is normal. There is mildly elevated pulmonary artery systolic pressure.  3. Left atrial size was mildly dilated.  4. Right atrial size was moderately dilated.  5. The mitral valve is normal in structure. Mild to moderate mitral valve regurgitation. No evidence of mitral stenosis.  6. The aortic valve is normal in structure. Aortic valve regurgitation is not visualized. No aortic stenosis is present. FINDINGS  Left Ventricle: Left ventricular ejection fraction, by estimation, is 50 to 55%. The left ventricle has low normal function. The left ventricle has no regional wall motion abnormalities. The left ventricular internal cavity size was normal in size. There is mild left ventricular hypertrophy. Left ventricular diastolic parameters are indeterminate. Right Ventricle: The right ventricular size is normal. No increase in right ventricular wall thickness. Right ventricular systolic function is normal. There is mildly elevated pulmonary artery systolic pressure. The tricuspid regurgitant velocity is 2.86  m/s, and with an assumed right atrial pressure of 10 mmHg, the estimated right ventricular systolic pressure is 68.3 mmHg. Left Atrium: Left atrial size was mildly dilated. Right Atrium: Right atrial size was moderately dilated. Pericardium: Trivial pericardial effusion is present. The pericardial effusion is posterior to the left ventricle. Mitral Valve: The mitral valve is normal in structure. Normal mobility of the mitral valve leaflets. Mild to moderate mitral valve regurgitation. No evidence of mitral  valve stenosis. Tricuspid Valve: The tricuspid valve is normal in structure. Tricuspid valve regurgitation is trivial. No evidence of tricuspid stenosis. Aortic Valve: The aortic valve is normal in structure. Aortic valve regurgitation is not visualized. No aortic stenosis is present. Aortic valve mean gradient measures 2.0 mmHg. Aortic valve peak gradient measures 3.9 mmHg. Aortic valve area, by VTI measures 2.60 cm. Pulmonic Valve: The pulmonic valve was normal in structure. Pulmonic valve regurgitation is mild. No evidence of pulmonic stenosis. Aorta: The aortic root is normal in size and structure. Venous: The inferior vena cava was not well visualized. IAS/Shunts: No atrial level shunt detected by color flow Doppler.  LEFT VENTRICLE PLAX 2D LVIDd:         3.96 cm LVIDs:         2.87 cm LV PW:         1.11 cm LV IVS:        1.18 cm LVOT diam:     2.00 cm LV SV:         45 LV SV Index:   25 LVOT Area:     3.14 cm  RIGHT VENTRICLE RV Basal diam:  4.48 cm RV S prime:     8.38 cm/s TAPSE (M-mode): 3.7 cm LEFT ATRIUM           Index       RIGHT ATRIUM           Index LA diam:      4.50 cm 2.51 cm/m  RA Area:     48.80 cm LA Vol (A2C): 36.2 ml 20.21 ml/m RA Volume:   226.00 ml 126.19  ml/m LA Vol (A4C): 75.1 ml 41.93 ml/m  AORTIC VALVE                   PULMONIC VALVE AV Area (Vmax):    2.70 cm    PV Vmax:        0.61 m/s AV Area (Vmean):   2.48 cm    PV Peak grad:   1.5 mmHg AV Area (VTI):     2.60 cm    RVOT Peak grad: 2 mmHg AV Vmax:           98.15 cm/s AV Vmean:          69.800 cm/s AV VTI:            0.174 m AV Peak Grad:      3.9 mmHg AV Mean Grad:      2.0 mmHg LVOT Vmax:         84.20 cm/s LVOT Vmean:        55.000 cm/s LVOT VTI:          0.144 m LVOT/AV VTI ratio: 0.83  AORTA Ao Root diam: 2.80 cm MITRAL VALVE                TRICUSPID VALVE MV Area (PHT): 4.80 cm     TR Peak grad:   32.7 mmHg MV Decel Time: 158 msec     TR Vmax:        286.00 cm/s MV E velocity: 108.00 cm/s                              SHUNTS                             Systemic VTI:  0.14 m                             Systemic Diam: 2.00 cm Kathlyn Sacramento MD Electronically signed by Kathlyn Sacramento MD Signature Date/Time: 09/30/2019/2:32:00 PM    Final     Thrombocytosis [facility]) #74 year old female patient with no significant past medical history is currently admitted to hospital for TIA; noted to have A. 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  Check JAK2; reflex.   #A. fib with RVR-rate currently controlled; follow cardiology.  #TIA-question embolic phenomenon-repeat MRI brain; neurology following  Recommendation:   #Given the risk of micro-thrombosis from elevated platelets/essential thrombocytosis [ given the absence of any other causes like iron deficiency]-I think it is reasonable to start the patient on Hydrea while awaiting above work-up.  Discussed the potential side effects including but not limited to nausea vomiting diarrhea; skin rash; drop in blood counts.  Overall patient be well-tolerated.  Start at 500 milligrams twice a day.  Discussed with the patient/husband detail.   Thank you Dr.Niu for allowing me to participate in the care of your pleasant patient. Please do not hesitate to contact me with questions or concerns in the interim.  # I reviewed the blood work- with the patient in detail; also reviewed the imaging independently [as summarized above]; and with the patient in detail.    All questions were answered. The patient knows to call the clinic with any problems, questions or concerns.    Cammie Sickle, MD 09/30/2019 5:08 PM

## 2019-09-30 NOTE — Consult Note (Signed)
Cardiology Consult    Patient ID: Kristy Sullivan MRN: [id number], DOB/AGE: [birth year]   Admit date: 09/30/2019 Date of Consult: 09/30/2019  Primary Physician: Patient, No Pcp Per Primary Cardiologist: Kathlyn Sacramento, MD Requesting Provider: Mora Bellman, MD  Patient Profile    Kristy Sullivan is a 74 y.o. female without significant PMH, who is being seen today for the evaluation of afib of uncertain duration at the request of Dr. Blaine Hamper.  Past Medical History   History reviewed. No pertinent past medical history.  History reviewed. No pertinent surgical history.   Allergies  No Known Allergies  History of Present Illness    74 y/o ? w/o significant prior past medical history.  She lives locally with her husband and walks just over a mile daily either outside or at the [facility].  She previously worked for [employer] and for the past few years has been working at [employer], doing odd jobs such as Education administrator, Medical sales representative, and assisting patients.  She has never smoked and denies alcohol or drug usage.  She notes that over the past several years, she has intermittently experienced palpitations, typically occurring after eating sugary foods, lasting several hours, and resolving spontaneously.  When palpitations and fluttering occur, she sometimes experiences lightheadedness but says that she does not think much of it and just "keeps on going."  She does not experience chest pain or dyspnea.  Today, she had just completed her mile plus walk with a friend, which in itself was uneventful.  Upon returning to the car and standing alongside it, she had sudden sensation of lightheadedness with a weakness of the left side of her body.  She felt she might pass out.  She fell straight to the ground but did not suffer any injury and did not lose consciousness.  Her friend came over to assist her.  She denies experiencing nausea, vomiting, diaphoresis, chest pain, palpitations, or dyspnea at the  time of this event.  She continued to feel somewhat lightheaded and weak and her friend called EMS.  Upon arrival, she was found to be in atrial fibrillation with a rate in the 90s.  She was given intravenous metoprolol with improvement in rate.  She was taken to the [facility] ED where she is hypertensive at 168/111.  Rates have been stable in the 80s to 90s.  She continues to report mild lightheadedness.  Left-sided weakness has resolved.  Lab work notable for thrombocytosis with platelets of 1057.  High-sensitivity troponin is mildly elevated at 19  21.  CT of the head is without acute findings.  She has been seen by neurology and MRI of the brain is pending.  Inpatient Medications    No current orders.  Home meds include: MVI Cod Liver Oil  Family History    Family History  Problem Relation Age of Onset  . High blood pressure Mother    She indicated that the status of her mother is unknown.   Social History    Social History   Socioeconomic History  . Marital status: Married    Spouse name: Not on file  . Number of children: Not on file  . Years of education: Not on file  . Highest education level: Not on file  Occupational History  . Not on file  Tobacco Use  . Smoking status: Never Smoker  . Smokeless tobacco: Never Used  Substance and Sexual Activity  . Alcohol use: Never  . Drug use: Never  . Sexual activity: Never  Other Topics Concern  . 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    Social Determinants of Health   Financial Resource Strain:   . Difficulty of Paying Living Expenses:   Food Insecurity:   . Worried About Charity fundraiser in the Last Year:   . Arboriculturist in the Last Year:   Transportation Needs:   . Film/video editor (Medical):   Marland Kitchen Lack of Transportation (Non-Medical):   Physical Activity:   . Days of Exercise per Week:   . Minutes of Exercise per Session:    Stress:   . Feeling of Stress :   Social Connections:   . Frequency of Communication with Friends and Family:   . Frequency of Social Gatherings with Friends and Family:   . Attends Religious Services:   . Active Member of Clubs or Organizations:   . Attends Archivist Meetings:   Marland Kitchen Marital Status:   Intimate Partner Violence:   . Fear of Current or Ex-Partner:   . Emotionally Abused:   Marland Kitchen Physically Abused:   . Sexually Abused:      Review of Systems    General:  No chills, fever, night sweats or weight changes.  Cardiovascular:  No chest pain, dyspnea on exertion, edema, orthopnea, +++ occasional fluttering-like palpitations but none today, +++ presyncope, no paroxysmal nocturnal dyspnea. Dermatological: No rash, lesions/masses Respiratory: No cough, dyspnea Urologic: No hematuria, dysuria Abdominal:   No nausea, vomiting, diarrhea, bright red blood per rectum, melena, or hematemesis Neurologic:  No visual changes, +++ presyncope and L sided wkns today.  No changes in mental status. All other systems reviewed and are otherwise negative except as noted above.  Physical Exam    Blood pressure (!) 168/111, pulse 92, temperature 98.7 F (37.1 C), temperature source Oral, resp. rate 19, height 5\' 3"  (1.6 m), weight 75.8 kg, SpO2 99 %.  General: Pleasant, NAD Psych: Normal affect. Neuro: Alert and oriented X 3. Moves all extremities spontaneously. HEENT: Normal  Neck: Supple without bruits or JVD. Lungs:  Resp regular and unlabored, CTA. Heart: RRR no s3, s4, or murmurs. Abdomen: Soft, non-tender, non-distended, BS + x 4.  Extremities: No clubbing, cyanosis or edema. DP/PT/Radials 2+ and equal bilaterally.  Labs    Cardiac Enzymes Recent Labs  Lab 09/30/19 0922 09/30/19 1108  TROPONINIHS 19* 21*      Lab Results  Component Value Date   WBC 9.1 09/30/2019   HGB 13.6 09/30/2019   HCT 40.9 09/30/2019   MCV 89.7 09/30/2019   PLT 1,057 (HH) 09/30/2019      Recent Labs  Lab 09/30/19 0922  NA 140  K 4.2  CL 106  CO2 26  BUN 17  CREATININE 0.87  CALCIUM 9.5  PROT 7.1  BILITOT 1.4*  ALKPHOS 80  ALT 24  AST 28  GLUCOSE 117*    Radiology Studies    CT Head Wo Contrast  Result Date: 09/30/2019 CLINICAL DATA:  TIA.  Syncopal episode today. EXAM: CT HEAD WITHOUT CONTRAST TECHNIQUE: Contiguous axial images were obtained from the base of the skull through the vertex without intravenous contrast. COMPARISON:  None. FINDINGS: Brain: Mild generalized white matter hypoattenuation is likely within normal limits for age. No acute infarct, hemorrhage, or mass lesion is present. The ventricles are of normal size. No significant extraaxial fluid collection is present. The brainstem and cerebellum are within  normal limits. Vascular: No hyperdense vessel or unexpected calcification. Skull: Calvarium is intact. No focal lytic or blastic lesions are present. No significant extracranial soft tissue lesion is present. Sinuses/Orbits: The paranasal sinuses and mastoid air cells are clear. The globes and orbits are within normal limits. IMPRESSION: Normal CT appearance of the brain for age. Electronically Signed   By: San Morelle M.D.   On: 09/30/2019 09:11    ECG & Cardiac Imaging    Atrial fibrillation, 98, left axis deviation, left anterior fascicular block, question old anteroseptal infarct - personally reviewed.  Assessment & Plan    1.  Persistent atrial fibrillation: Patient presented to the emergency department today after experiencing lightheadedness and left-sided weakness after walking with a friend.  Upon EMS arrival, she was found to be in atrial fibrillation and she was taken to the [facility] ED.  Here, she remains in atrial fibrillation with rates in the 80s to 90s.  She notes that she continues to feel little bit lightheaded but is not particularly symptomatic at this time.  She does have a history of fluttering-like palpitations dating back  several years, occurring frequently after eating sugary desserts, and lasting up to hours at a time.  Interestingly, she denies any palpitations today/currently.  She is hypertensive in the emergency department.  CHA2DS2-VASc likely 4 in the setting of age of 78, probable untreated hypertension, and female gender.  She is currently undergoing stroke work-up in the setting of now resolved left-sided weakness and MRI of the brain is pending.  She has been seen by neurology and provided that MRI is without acute findings, plan will be for initiation of oral anticoagulation (Eliquis 5 mg twice daily if okay with hematology).  Would add oral metoprolol therapy at 25 mg twice daily.  Follow-up TSH and echocardiogram.  Provided that echo is normal, she is able to be rate controlled, there are no significant events on telemetry, and she remains relatively asymptomatic, she could likely be discharged home over the weekend on oral beta-blocker and Eliquis with a plan for outpatient follow-up with cardioversion in 3 to 4 weeks.  2.  Left-sided weakness: Now resolved.  Head CT without acute findings.  MRI pending.  Neurology following.  3.  Hypertension: No prior history of hypertension blood pressures have been elevated in emergency department.  Adding oral beta-blocker therapy in the setting of atrial fibrillation.  4.  Thrombocytosis: Platelets of 1057.  Hematology has been consulted.  5.  Elevated troponin: Mild high-sensitivity troponin elevation with flat trend of 19  21.  She denies chest pain or dyspnea.  She is very active and walks at least a mile every day either outside or at the [facility].  Follow-up echocardiogram as above.  Will consider ischemic testing as an outpatient for risk stratification.  Signed, Murray Hodgkins, NP 09/30/2019, 12:53 PM  For questions or updates, please contact   Please consult [email or web address] for contact info under Cardiology/STEMI.

## 2019-09-30 NOTE — Progress Notes (Signed)
OT Cancellation Note  Patient Details Name: Jatia Musa MRN: [id number] DOB: [birth year]   Cancelled Treatment:    Reason Eval/Treat Not Completed: Patient at procedure or test/ unavailable. Order received, chart reviewed. Pt out of room for testing. Will re-attempt OT evaluation at later date/time as pt is available and medically appropriate.  Jeni Salles, MPH, MS, OTR/L ascom [phone] 09/30/19, 3:32 PM

## 2019-09-30 NOTE — Assessment & Plan Note (Addendum)
#  74 year old female patient with no significant past medical history is currently admitted to hospital for TIA; noted to have A. 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  Awaiting on JAK2; reflex.  Continue Hydrea 500 milligrams twice a day platelets slightly improved 954 today.  #A. fib with RVR-rate currently controlled; on Eliquis- follow cardiology.  #Stroke noted on MRI no residual deficits- on anticoagulation; consider adding Asprin 81 mg/day;  noted at this time-management as per neurology/primary service.

## 2019-09-30 NOTE — H&P (Signed)
History and Physical    Kristy Sullivan JKK:[id number] DOB: 1945-11-14 DOA: 09/30/2019  Referring MD/NP/PA:   PCP: Patient, No Pcp Per   Patient coming from:  The patient is coming from home.  At baseline, pt is independent for most of ADL.        Chief Complaint: Left-sided weakness and dizziness  HPI: Kristy Sullivan is a 74 y.o. female without a significant past medical history, who presents with left-sided weakness and dizziness.  Pt states she woke up in her usual staet of health this AM. She went walking for approx 1 mile and was returning home. Then she started feeling dizzy and lightheaded.  She also had left-sided weakness, which has resolved at arrival to the ED.  No visual change or hearing loss.  No facial droop or slurred speech.  Patient denies chest pain, cough, shortness of breath.  No fever or chills.  No nausea vomiting, diarrhea, abdominal pain, symptoms of UTI.  Pt was found to have new onset atrial fibrillation with heart rate 110-1 10-200 initially, currently heart rate is at 100s.   ED Course: pt was found to have negative Covid19 PCR, troponin 19, 21, WBC 9.0, thrombocytosis with a platelet pain 57, electrolytes renal function okay, temperature normal, blood pressure 168/111, heart rate 101, RR 19, oxygen saturation 94% on room air.  CT head negative.  Patient is placed on MedSurg bed for observation. Neurology, Dr. Irish Elders and oncology, Dr. Rogue Bussing were consulted.  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 No confluent infarction. No swelling or hemorrhage.  Moderate chronic small-vessel ischemic changes elsewhere throughout the cerebral hemispheric white matter.  Review of Systems:   General: no fevers, chills, no body weight gain, has fatigue HEENT: no blurry vision, hearing changes or  sore throat Respiratory: no dyspnea, coughing, wheezing CV: no chest pain, no palpitations GI: no nausea, vomiting, abdominal pain, diarrhea, constipation GU: no dysuria, burning on urination, increased urinary frequency, hematuria  Ext: no leg edema Neuro:  no vision change or hearing loss. Has left-sided weakness and dizziness Skin: no rash, no skin tear. MSK: No muscle spasm, no deformity, no limitation of range of movement in spin Heme: No easy bruising.  Travel history: No recent long distant travel.  Allergy: No Known Allergies  History reviewed. No pertinent past medical history.  History reviewed. No pertinent surgical history.  Social History:  reports that she has never smoked. She has never used smokeless tobacco. She reports that she does not drink alcohol and does not use drugs.  Family History:  Family History  Problem Relation Age of Onset  . High blood pressure Mother   . Hypertension Brother      Prior to Admission medications   Not on File    Physical Exam: Vitals:   09/30/19 0930 09/30/19 1000 09/30/19 1030 09/30/19 1311  BP: (!) 152/95 (!) 150/99 (!) 168/111 (!) 162/81  Pulse: 76 101 92 91  Resp:    21  Temp:      TempSrc:      SpO2: 96% 94% 99% 100%  Weight:      Height:       General: Not in acute distress HEENT:       Eyes: PERRL, EOMI, no scleral icterus.       ENT: No discharge from the ears and nose, no pharynx injection, no tonsillar  enlargement.        Neck: No JVD, no bruit, no mass felt. Heme: No neck lymph node enlargement. Cardiac: S1/S2, RRR, No murmurs, No gallops or rubs. Respiratory: No rales, wheezing, rhonchi or rubs. GI: Soft, nondistended, nontender, no rebound pain, no organomegaly, BS present. GU: No hematuria Ext: No pitting leg edema bilaterally. 2+DP/PT pulse bilaterally. Musculoskeletal: No joint deformities, No joint redness or warmth, no limitation of ROM in spin. Skin: No rashes.  Neuro: Alert, oriented X3,  cranial nerves II-XII grossly intact, moves all extremities normally. Muscle strength 5/5 in all extremities, sensation to light touch intact. Brachial reflex 2+ bilaterally. Psych: Patient is not psychotic, no suicidal or hemocidal ideation.  Labs on Admission: I have personally reviewed following labs and imaging studies  CBC: Recent Labs  Lab 09/30/19 0922  WBC 9.1  NEUTROABS 6.7  HGB 13.6  HCT 40.9  MCV 89.7  PLT 0,539*   Basic Metabolic Panel: Recent Labs  Lab 09/30/19 0922  NA 140  K 4.2  CL 106  CO2 26  GLUCOSE 117*  BUN 17  CREATININE 0.87  CALCIUM 9.5  MG 2.0   GFR: Estimated Creatinine Clearance: 53.7 mL/min (by C-G formula based on SCr of 0.87 mg/dL). Liver Function Tests: Recent Labs  Lab 09/30/19 0922  AST 28  ALT 24  ALKPHOS 80  BILITOT 1.4*  PROT 7.1  ALBUMIN 4.0   No results for input(s): LIPASE, AMYLASE in the last 168 hours. No results for input(s): AMMONIA in the last 168 hours. Coagulation Profile: No results for input(s): INR, PROTIME in the last 168 hours. Cardiac Enzymes: No results for input(s): CKTOTAL, CKMB, CKMBINDEX, TROPONINI in the last 168 hours. BNP (last 3 results) No results for input(s): PROBNP in the last 8760 hours. HbA1C: No results for input(s): HGBA1C in the last 72 hours. CBG: No results for input(s): GLUCAP in the last 168 hours. Lipid Profile: No results for input(s): CHOL, HDL, LDLCALC, TRIG, CHOLHDL, LDLDIRECT in the last 72 hours. Thyroid Function Tests: Recent Labs    09/30/19 0922  TSH 2.712   Anemia Panel: No results for input(s): VITAMINB12, FOLATE, FERRITIN, TIBC, IRON, RETICCTPCT in the last 72 hours. Urine analysis: No results found for: COLORURINE, APPEARANCEUR, LABSPEC, PHURINE, GLUCOSEU, HGBUR, BILIRUBINUR, KETONESUR, PROTEINUR, UROBILINOGEN, NITRITE, LEUKOCYTESUR Sepsis Labs: @LABRCNTIP (procalcitonin:4,lacticidven:4) ) Recent Results (from the past 240 hour(s))  SARS Coronavirus 2 by RT PCR  (hospital order, performed in [facility] hospital lab) Nasopharyngeal Nasopharyngeal Swab     Status: None   Collection Time: 09/30/19 10:10 AM   Specimen: Nasopharyngeal Swab  Result Value Ref Range Status   SARS Coronavirus 2 NEGATIVE NEGATIVE Final    Comment: (NOTE) SARS-CoV-2 target nucleic acids are NOT DETECTED.  The SARS-CoV-2 RNA is generally detectable in upper and lower respiratory specimens during the acute phase of infection. The lowest concentration of SARS-CoV-2 viral copies this assay can detect is 250 copies / mL. A negative result does not preclude SARS-CoV-2 infection and should not be used as the sole basis for treatment or other patient management decisions.  A negative result may occur with improper specimen collection / handling, submission of specimen other than nasopharyngeal swab, presence of viral mutation(s) within the areas targeted by this assay, and inadequate number of viral copies (<250 copies / mL). A negative result must be combined with clinical observations, patient history, and epidemiological information.  Fact Sheet for Patients:   StrictlyIdeas.no  Fact Sheet for Healthcare Providers: BankingDealers.co.za  This test is  not yet approved or  cleared by the [place] and has been authorized for detection and/or diagnosis of SARS-CoV-2 by FDA under an Emergency Use Authorization (EUA).  This EUA will remain in effect (meaning this test can be used) for the duration of the COVID-19 declaration under Section 564(b)(1) of the Act, 21 U.S.C. section 360bbb-3(b)(1), unless the authorization is terminated or revoked sooner.  Performed at [facility], [address], [place], [place] [address]      Radiological Exams on Admission: CT Head Wo Contrast  Result Date: 09/30/2019 CLINICAL DATA:  TIA.  Syncopal episode today. EXAM: CT HEAD WITHOUT CONTRAST TECHNIQUE: Contiguous axial  images were obtained from the base of the skull through the vertex without intravenous contrast. COMPARISON:  None. FINDINGS: Brain: Mild generalized white matter hypoattenuation is likely within normal limits for age. No acute infarct, hemorrhage, or mass lesion is present. The ventricles are of normal size. No significant extraaxial fluid collection is present. The brainstem and cerebellum are within normal limits. Vascular: No hyperdense vessel or unexpected calcification. Skull: Calvarium is intact. No focal lytic or blastic lesions are present. No significant extracranial soft tissue lesion is present. Sinuses/Orbits: The paranasal sinuses and mastoid air cells are clear. The globes and orbits are within normal limits. IMPRESSION: Normal CT appearance of the brain for age. Electronically Signed   By: San Morelle M.D.   On: 09/30/2019 09:11   MR BRAIN WO CONTRAST  Result Date: 09/30/2019 CLINICAL DATA:  Left-sided weakness and near syncope. EXAM: MRI HEAD WITHOUT CONTRAST TECHNIQUE: Multiplanar, multiecho pulse sequences of the brain and surrounding structures were obtained without intravenous contrast. 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 No confluent or large vessel territory infarction. No mass lesion, hemorrhage, hydrocephalus or extra-axial collection. Background pattern of moderate chronic small-vessel ischemic changes throughout both cerebral hemispheres. Vascular: Major vessels at the base of the brain show flow. Skull and upper cervical spine: Negative Sinuses/Orbits: Clear/normal Other: Right mastoid effusion. IMPRESSION: Several punctate acute infarctions in the right parietal lobe affecting the cortical and subcortical brain consistent with micro embolic  infarctions in a right MCA branch vessel distribution. No confluent infarction. No swelling or hemorrhage. Moderate chronic small-vessel ischemic changes elsewhere throughout the cerebral hemispheric white matter. Electronically Signed   By: Nelson Chimes M.D.   On: 09/30/2019 16:02   ECHOCARDIOGRAM COMPLETE  Result Date: 09/30/2019    ECHOCARDIOGRAM REPORT   Patient Name:   ALIZAH SILLS Date of Exam: 09/30/2019 Medical Rec #:  [id number]        Height:       63.0 in Accession #:    [id number]       Weight:       167.0 lb Date of Birth:  [birth year]        BSA:          1.791 m Patient Age:    75 years         BP:           102/81 mmHg Patient Gender: F                HR:           91 bpm. Exam Location:  [facility] Procedure: 2D Echo, Color Doppler and Cardiac Doppler Indications:  Atrial fibrillation 427.31  History:         Patient has no prior history of Echocardiogram examinations. No                  medical history on file.  Sonographer:     Sherrie Sport RDCS (AE) Referring Phys:  [id number] [place] Diagnosing Phys: Kathlyn Sacramento MD  Sonographer Comments: Suboptimal apical window. IMPRESSIONS  1. Left ventricular ejection fraction, by estimation, is 50 to 55%. The left ventricle has low normal function. The left ventricle has no regional wall motion abnormalities. There is mild left ventricular hypertrophy. Left ventricular diastolic parameters are indeterminate.  2. Right ventricular systolic function is normal. The right ventricular size is normal. There is mildly elevated pulmonary artery systolic pressure.  3. Left atrial size was mildly dilated.  4. Right atrial size was moderately dilated.  5. The mitral valve is normal in structure. Mild to moderate mitral valve regurgitation. No evidence of mitral stenosis.  6. The aortic valve is normal in structure. Aortic valve regurgitation is not visualized. No aortic stenosis is present. FINDINGS  Left Ventricle: Left ventricular ejection fraction, by  estimation, is 50 to 55%. The left ventricle has low normal function. The left ventricle has no regional wall motion abnormalities. The left ventricular internal cavity size was normal in size. There is mild left ventricular hypertrophy. Left ventricular diastolic parameters are indeterminate. Right Ventricle: The right ventricular size is normal. No increase in right ventricular wall thickness. Right ventricular systolic function is normal. There is mildly elevated pulmonary artery systolic pressure. The tricuspid regurgitant velocity is 2.86  m/s, and with an assumed right atrial pressure of 10 mmHg, the estimated right ventricular systolic pressure is 62.7 mmHg. Left Atrium: Left atrial size was mildly dilated. Right Atrium: Right atrial size was moderately dilated. Pericardium: Trivial pericardial effusion is present. The pericardial effusion is posterior to the left ventricle. Mitral Valve: The mitral valve is normal in structure. Normal mobility of the mitral valve leaflets. Mild to moderate mitral valve regurgitation. No evidence of mitral valve stenosis. Tricuspid Valve: The tricuspid valve is normal in structure. Tricuspid valve regurgitation is trivial. No evidence of tricuspid stenosis. Aortic Valve: The aortic valve is normal in structure. Aortic valve regurgitation is not visualized. No aortic stenosis is present. Aortic valve mean gradient measures 2.0 mmHg. Aortic valve peak gradient measures 3.9 mmHg. Aortic valve area, by VTI measures 2.60 cm. Pulmonic Valve: The pulmonic valve was normal in structure. Pulmonic valve regurgitation is mild. No evidence of pulmonic stenosis. Aorta: The aortic root is normal in size and structure. Venous: The inferior vena cava was not well visualized. IAS/Shunts: No atrial level shunt detected by color flow Doppler.  LEFT VENTRICLE PLAX 2D LVIDd:         3.96 cm LVIDs:         2.87 cm LV PW:         1.11 cm LV IVS:        1.18 cm LVOT diam:     2.00 cm LV SV:          45 LV SV Index:   25 LVOT Area:     3.14 cm  RIGHT VENTRICLE RV Basal diam:  4.48 cm RV S prime:     8.38 cm/s TAPSE (M-mode): 3.7 cm LEFT ATRIUM           Index       RIGHT ATRIUM  Index LA diam:      4.50 cm 2.51 cm/m  RA Area:     48.80 cm LA Vol (A2C): 36.2 ml 20.21 ml/m RA Volume:   226.00 ml 126.19 ml/m LA Vol (A4C): 75.1 ml 41.93 ml/m  AORTIC VALVE                   PULMONIC VALVE AV Area (Vmax):    2.70 cm    PV Vmax:        0.61 m/s AV Area (Vmean):   2.48 cm    PV Peak grad:   1.5 mmHg AV Area (VTI):     2.60 cm    RVOT Peak grad: 2 mmHg AV Vmax:           98.15 cm/s AV Vmean:          69.800 cm/s AV VTI:            0.174 m AV Peak Grad:      3.9 mmHg AV Mean Grad:      2.0 mmHg LVOT Vmax:         84.20 cm/s LVOT Vmean:        55.000 cm/s LVOT VTI:          0.144 m LVOT/AV VTI ratio: 0.83  AORTA Ao Root diam: 2.80 cm MITRAL VALVE                TRICUSPID VALVE MV Area (PHT): 4.80 cm     TR Peak grad:   32.7 mmHg MV Decel Time: 158 msec     TR Vmax:        286.00 cm/s MV E velocity: 108.00 cm/s                             SHUNTS                             Systemic VTI:  0.14 m                             Systemic Diam: 2.00 cm Kathlyn Sacramento MD Electronically signed by Kathlyn Sacramento MD Signature Date/Time: 09/30/2019/2:32:00 PM    Final      EKG: Independently reviewed.  Atrial fibrillation, LAD, poor R wave progression  Assessment/Plan Principal Problem:   Stroke [facility]) Active Problems:   Thrombocytosis (HCC)   Elevated troponin   HTN (hypertension)   Atrial fibrillation (HCC)   Stroke [facility]): MRI of brain showed embolic stroke.  Possibly related to thrombocytosis and atrial fibrillation.  Neurology was consulted, Dr. Irish Elders recommended MRI of brain which was done.  Patient received 32 4 mg of aspirin today  -place on med-surg bed for obs - will follow up Neurology's Recs.  - start Eliquis.  I explained the benefit and risk to the patient.  Patient agreed to take  Eliquis. - start lipitor 40 mg daily - fasting lipid panel and HbA1c  - 2D transthoracic echocardiography  - swallowing screen. If fails, will get SLP - Check UDS  - PT/OT consult  Thrombocytosis ([place]): Oncology is consulted -Check peripheral smear -Follow-up Dr. Rudean Haskell recommendation  Elevated troponin and new onset atrial fibrillation: Cardiology, Dr. Fletcher Anon is consulted.  Patient does not have chest pain. Elevated troponin is likely due to demand ischemia. -Metoprolol 25 mg twice daily per Dr.  Arida -Eliquis 5 mg twice daily -Trend troponin -Check A1c, FLP, UDS -Follow-up 2D echo -lipitor   HTN (hypertension) -Metoprolol which is for atrial fibrillation           DVT ppx: on Eliquis Code Status: Full code Family Communication:  Yes, patient's husband  at bed side Disposition Plan:  Anticipate discharge back to previous environment Consults called: Dr. Irish Elders for neurology, Dr. Rogue Bussing of oncology Admission status: Med-surg bed for obs    Status is: Observation  The patient remains OBS appropriate and will d/c before 2 midnights.  Dispo: The patient is from: Home              Anticipated d/c is to: Home              Anticipated d/c date is: 1 day              Patient currently is not medically stable to d/c.           Date of Service 09/30/2019    Ivor Costa [facility]   If 7PM-7AM, please contact night-coverage [email or web address] 09/30/2019, 7:01 PM

## 2019-09-30 NOTE — ED Notes (Addendum)
Report finished att and Caitlyn, NT called again to transport

## 2019-09-30 NOTE — ED Notes (Signed)
Caitlyn, NT called for transport

## 2019-09-30 NOTE — ED Notes (Signed)
Provider at bedside

## 2019-09-30 NOTE — ED Notes (Signed)
Pt given meal tray with extra food and fluids

## 2019-10-01 ENCOUNTER — Other Ambulatory Visit: Payer: Self-pay

## 2019-10-01 ENCOUNTER — Observation Stay: Payer: Medicare Other

## 2019-10-01 DIAGNOSIS — I10 Essential (primary) hypertension: Secondary | ICD-10-CM | POA: Diagnosis not present

## 2019-10-01 DIAGNOSIS — E785 Hyperlipidemia, unspecified: Secondary | ICD-10-CM | POA: Diagnosis not present

## 2019-10-01 DIAGNOSIS — D473 Essential (hemorrhagic) thrombocythemia: Secondary | ICD-10-CM | POA: Diagnosis not present

## 2019-10-01 DIAGNOSIS — I4819 Other persistent atrial fibrillation: Secondary | ICD-10-CM

## 2019-10-01 DIAGNOSIS — I63419 Cerebral infarction due to embolism of unspecified middle cerebral artery: Secondary | ICD-10-CM | POA: Diagnosis not present

## 2019-10-01 DIAGNOSIS — R778 Other specified abnormalities of plasma proteins: Secondary | ICD-10-CM | POA: Diagnosis not present

## 2019-10-01 LAB — CBC
HCT: 39.4 % (ref 36.0–46.0)
Hemoglobin: 12.8 g/dL (ref 12.0–15.0)
MCH: 30.1 pg (ref 26.0–34.0)
MCHC: 32.5 g/dL (ref 30.0–36.0)
MCV: 92.7 fL (ref 80.0–100.0)
Platelets: 954 10*3/uL (ref 150–400)
RBC: 4.25 MIL/uL (ref 3.87–5.11)
RDW: 15.9 % — ABNORMAL HIGH (ref 11.5–15.5)
WBC: 8.3 10*3/uL (ref 4.0–10.5)
nRBC: 0 % (ref 0.0–0.2)

## 2019-10-01 LAB — LIPID PANEL
Cholesterol: 223 mg/dL — ABNORMAL HIGH (ref 0–200)
HDL: 60 mg/dL (ref >40)
LDL Cholesterol: 146 mg/dL — ABNORMAL HIGH (ref 0–99)
Total CHOL/HDL Ratio: 3.7 RATIO
Triglycerides: 83 mg/dL (ref <150)
VLDL: 17 mg/dL (ref 0–40)

## 2019-10-01 LAB — BASIC METABOLIC PANEL
Anion gap: 10 (ref 5–15)
BUN: 15 mg/dL (ref 8–23)
CO2: 23 mmol/L (ref 22–32)
Calcium: 9.2 mg/dL (ref 8.9–10.3)
Chloride: 107 mmol/L (ref 98–111)
Creatinine, Ser: 0.84 mg/dL (ref 0.44–1.00)
GFR calc Af Amer: >60 mL/min (ref >60)
GFR calc non Af Amer: >60 mL/min (ref >60)
Glucose, Bld: 95 mg/dL (ref 70–99)
Potassium: 3.6 mmol/L (ref 3.5–5.1)
Sodium: 140 mmol/L (ref 135–145)

## 2019-10-01 LAB — HEMOGLOBIN A1C
Hgb A1c MFr Bld: 6.1 % — ABNORMAL HIGH (ref 4.8–5.6)
Mean Plasma Glucose: 128.37 mg/dL

## 2019-10-01 MED ORDER — HYDROXYUREA 500 MG PO CAPS: 500.0000 mg | ORAL_CAPSULE | Freq: Two times a day (BID) | ORAL | 0 refills | Status: DC

## 2019-10-01 MED ORDER — METOPROLOL TARTRATE 25 MG PO TABS: 12.5000 mg | ORAL_TABLET | Freq: Two times a day (BID) | ORAL | 0 refills | Status: DC

## 2019-10-01 MED ORDER — ATORVASTATIN CALCIUM 40 MG PO TABS: 40.0000 mg | ORAL_TABLET | Freq: Every day | ORAL | 0 refills | Status: DC

## 2019-10-01 MED ORDER — IOHEXOL 350 MG/ML SOLN
75.0000 mL | Freq: Once | INTRAVENOUS | Status: AC | PRN
  Administered 2019-10-01: 75 mL via INTRAVENOUS

## 2019-10-01 MED ORDER — METOPROLOL TARTRATE 25 MG PO TABS: 12.5000 mg | ORAL_TABLET | Freq: Two times a day (BID) | ORAL | Status: DC

## 2019-10-01 MED ORDER — APIXABAN 5 MG PO TABS: 5.0000 mg | ORAL_TABLET | Freq: Two times a day (BID) | ORAL | 0 refills | Status: DC

## 2019-10-01 NOTE — Plan of Care (Signed)
Patient given stroke education pamphlet.

## 2019-10-01 NOTE — Discharge Summary (Signed)
Physician Discharge Summary  Kristy Sullivan DPO:[id number] DOB: 01-09-1946 DOA: 09/30/2019  PCP: Patient, No Pcp Per  Admit date: 09/30/2019 Discharge date: 10/01/2019  Admitted From: home Disposition:  Home   Recommendations for Outpatient Follow-up:  1. Follow up with PCP in 1-2 weeks 2. F/u heme/onco, Dr. Rogue Bussing, in 3-4 days 3. F/u cardio, Dr. Fletcher Anon, in 1-2 weeks 4. F/u neuro in 1-2 weeks  Home Health: no Equipment/Devices:  Discharge Condition: stable CODE STATUS: full  Diet recommendation: Heart Healthy   Brief/Interim Summary: HPI was taken from Dr. Blaine Hamper: Kristy Sullivan is a 74 y.o. female without a significant past medical history, who presents with left-sided weakness and dizziness.  Pt states she woke up in her usual staet of health this AM. She went walking for approx 1 mile and was returning home. Then she started feeling dizzy and lightheaded.  She also had left-sided weakness, which has resolved at arrival to the ED.  No visual change or hearing loss.  No facial droop or slurred speech.  Patient denies chest pain, cough, shortness of breath.  No fever or chills.  No nausea vomiting, diarrhea, abdominal pain, symptoms of UTI.  Pt was found to have new onset atrial fibrillation with heart rate 110-1 10-200 initially, currently heart rate is at 100s.   ED Course: pt was found to have negative Covid19 PCR, troponin 19, 21, WBC 9.0, thrombocytosis with a platelet pain 57, electrolytes renal function okay, temperature normal, blood pressure 168/111, heart rate 101, RR 19, oxygen saturation 94% on room air.  CT head negative.  Patient is placed on MedSurg bed for observation. Neurology, Dr. Irish Elders and oncology, Dr. Rogue Bussing were consulted.  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 No confluent  infarction. No swelling or hemorrhage.  Moderate chronic small-vessel ischemic changes elsewhere throughout the cerebral hemispheric white matter.  Hospital Course from Dr. 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 fib. The a. fib is new onset and pt was started on metoprolol. Pt will start eliquis tomorrow as per neuro. 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 Pt will f/u w/ heme/onco in 3-4 days after d/c. Pt verbalized her understanding. Pt will also have to f/u w/ cardio for new onset a. fib, as well as neuro for recent CVA. Pt verbalized her understanding. Pt was also started on statin but no aspirin as pt was started on eliquis and hydroxyurea both of which can increase the pt's risk of bleeding. Please see previous progress notes for more information.  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  Possibly related to thrombocytosis and atrial fibrillation. Continue on aspirin, eliquis, statin. Echo shows EF 43-15%, diastolic function indeterminate, no wall motion abnormalities. PT/OT consulted. 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 JAK2 pending. Continue on hydroxyurea as per onco   A. fib: new onset. Elevated troponin is likely due to demand ischemia. Continue on metoprolol & eliquis. Echo shows EF 40-08%, diastolic function indeterminate, no wall motion abnormalities.   HTN: continue on metoprolol   Discharge  Instructions  Discharge Instructions    Diet - low sodium heart healthy   Complete by: As directed    Discharge instructions   Complete by: As directed     F/u onco/heme, Dr. Rogue Bussing, in 3-4 days. F/u w/ cardio, Dr. Fletcher Anon, in 1-2 weeks. F/u PCP in 1 week. F/u neuro in 1-2 weeks   Increase activity slowly   Complete by: As directed      Allergies as of 10/01/2019   No Known Allergies     Medication List    TAKE these medications   apixaban 5 MG Tabs tablet Commonly known as: ELIQUIS Take 1 tablet (5 mg total) by mouth 2 (two) times daily. Start taking on: October 02, 2019   atorvastatin 40 MG tablet Commonly known as: LIPITOR Take 1 tablet (40 mg total) by mouth daily. Start taking on: October 02, 2019   hydroxyurea 500 MG capsule Commonly known as: HYDREA Take 1 capsule (500 mg total) by mouth 2 (two) times daily. May take with food to minimize GI side effects.   metoprolol tartrate 25 MG tablet Commonly known as: LOPRESSOR Take 0.5 tablets (12.5 mg total) by mouth 2 (two) times daily.       No Known Allergies  Consultations:  Neuro  Cardio   Heme/onco   Procedures/Studies: CT ANGIO HEAD W OR WO CONTRAST  Result Date: 10/01/2019 CLINICAL DATA:  Left-sided weakness and near syncope. Punctate acute infarctions in the right parietal lobe. EXAM: CT ANGIOGRAPHY HEAD AND NECK TECHNIQUE: Multidetector CT imaging of the head and neck was performed using the standard protocol during bolus administration of intravenous contrast. Multiplanar CT image reconstructions and MIPs were obtained to evaluate the vascular anatomy. Carotid stenosis measurements (when applicable) are obtained utilizing NASCET criteria, using the distal internal carotid diameter as the denominator. CONTRAST:  3mL OMNIPAQUE IOHEXOL 350 MG/ML SOLN COMPARISON:  CT and MRI done yesterday. FINDINGS: CT HEAD FINDINGS Brain: Continued normal appearance for age without evidence of atrophy, old or acute infarction, mass lesion, hemorrhage, hydrocephalus or extra-axial collection. Tiny acute infarctions in the right parietal region shown by MRI are not appreciable by  CT. Vascular: There is atherosclerotic calcification of the major vessels at the base of the brain. Skull: Negative Sinuses: Clear Orbits: Normal Review of the MIP images confirms the above findings CTA NECK FINDINGS Aortic arch: Aortic atherosclerosis. No aneurysm or dissection. Branching pattern is normal without origin stenosis. Right carotid system: Common carotid artery widely patent to the bifurcation. Carotid bifurcation is normal without soft or calcified plaque. Cervical ICA is widely patent. Left carotid system: Common carotid artery widely patent to the bifurcation. Minimal calcified plaque at the proximal ICA bulb but no stenosis. Cervical ICA widely patent beyond that. Vertebral arteries: Both vertebral artery origins widely patent. Both vertebral arteries normal through the cervical region to the foramen magnum. Skeleton: Ordinary cervical spondylosis. Other neck: No mass or adenopathy. Upper chest: Normal Review of the MIP images confirms the above findings CTA HEAD FINDINGS Anterior circulation: Both internal carotid arteries widely patent through the skull base and siphon regions. No stenosis. The anterior and middle cerebral vessels are patent without proximal stenosis, aneurysm or vascular malformation. No identifiable missing large or medium size vessel. Posterior circulation: Both vertebral arteries are patent to the basilar. No basilar stenosis. Posterior circulation branch vessels are normal. Venous sinuses: Patent and normal. Anatomic variants: None significant. Review of the MIP images confirms the above findings IMPRESSION: No large or medium vessel occlusion. Aortic atherosclerosis.  No brachiocephalic vessel origin stenosis. No stenosis at either carotid bifurcation. Small infarctions in the right parietal region shown by MRI consistent with micro embolic infarctions. Electronically Signed   By: Nelson Chimes M.D.   On: 10/01/2019 13:41   CT Head Wo Contrast  Result Date:  09/30/2019 CLINICAL DATA:  TIA.  Syncopal episode today. EXAM: CT HEAD WITHOUT CONTRAST TECHNIQUE: Contiguous axial images were obtained from the base of the skull through the vertex without intravenous contrast. COMPARISON:  None. FINDINGS: Brain: Mild generalized white matter hypoattenuation is likely within normal limits for age. No acute infarct, hemorrhage, or mass lesion is present. The ventricles are of normal size. No significant extraaxial fluid collection is present. The brainstem and cerebellum are within normal limits. Vascular: No hyperdense vessel or unexpected calcification. Skull: Calvarium is intact. No focal lytic or blastic lesions are present. No significant extracranial soft tissue lesion is present. Sinuses/Orbits: The paranasal sinuses and mastoid air cells are clear. The globes and orbits are within normal limits. IMPRESSION: Normal CT appearance of the brain for age. Electronically Signed   By: San Morelle M.D.   On: 09/30/2019 09:11   CT ANGIO NECK W OR WO CONTRAST  Result Date: 10/01/2019 CLINICAL DATA:  Left-sided weakness and near syncope. Punctate acute infarctions in the right parietal lobe. EXAM: CT ANGIOGRAPHY HEAD AND NECK TECHNIQUE: Multidetector CT imaging of the head and neck was performed using the standard protocol during bolus administration of intravenous contrast. Multiplanar CT image reconstructions and MIPs were obtained to evaluate the vascular anatomy. Carotid stenosis measurements (when applicable) are obtained utilizing NASCET criteria, using the distal internal carotid diameter as the denominator. CONTRAST:  83mL OMNIPAQUE IOHEXOL 350 MG/ML SOLN COMPARISON:  CT and MRI done yesterday. FINDINGS: CT HEAD FINDINGS Brain: Continued normal appearance for age without evidence of atrophy, old or acute infarction, mass lesion, hemorrhage, hydrocephalus or extra-axial collection. Tiny acute infarctions in the right parietal region shown by MRI are not appreciable  by CT. Vascular: There is atherosclerotic calcification of the major vessels at the base of the brain. Skull: Negative Sinuses: Clear Orbits: Normal Review of the MIP images confirms the above findings CTA NECK FINDINGS Aortic arch: Aortic atherosclerosis. No aneurysm or dissection. Branching pattern is normal without origin stenosis. Right carotid system: Common carotid artery widely patent to the bifurcation. Carotid bifurcation is normal without soft or calcified plaque. Cervical ICA is widely patent. Left carotid system: Common carotid artery widely patent to the bifurcation. Minimal calcified plaque at the proximal ICA bulb but no stenosis. Cervical ICA widely patent beyond that. Vertebral arteries: Both vertebral artery origins widely patent. Both vertebral arteries normal through the cervical region to the foramen magnum. Skeleton: Ordinary cervical spondylosis. Other neck: No mass or adenopathy. Upper chest: Normal Review of the MIP images confirms the above findings CTA HEAD FINDINGS Anterior circulation: Both internal carotid arteries widely patent through the skull base and siphon regions. No stenosis. The anterior and middle cerebral vessels are patent without proximal stenosis, aneurysm or vascular malformation. No identifiable missing large or medium size vessel. Posterior circulation: Both vertebral arteries are patent to the basilar. No basilar stenosis. Posterior circulation branch vessels are normal. Venous sinuses: Patent and normal. Anatomic variants: None significant. Review of the MIP images confirms the above findings IMPRESSION: No large or medium vessel occlusion. Aortic atherosclerosis.  No brachiocephalic vessel origin stenosis. No stenosis at either carotid bifurcation. Small infarctions in the right parietal region shown by MRI consistent with micro embolic  infarctions. Electronically Signed   By: Nelson Chimes M.D.   On: 10/01/2019 13:41   MR BRAIN WO CONTRAST  Result Date:  09/30/2019 CLINICAL DATA:  Left-sided weakness and near syncope. EXAM: MRI HEAD WITHOUT CONTRAST TECHNIQUE: Multiplanar, multiecho pulse sequences of the brain and surrounding structures were obtained without intravenous contrast. 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 No confluent or large vessel territory infarction. No mass lesion, hemorrhage, hydrocephalus or extra-axial collection. Background pattern of moderate chronic small-vessel ischemic changes throughout both cerebral hemispheres. Vascular: Major vessels at the base of the brain show flow. Skull and upper cervical spine: Negative Sinuses/Orbits: Clear/normal Other: Right mastoid effusion. 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 No confluent infarction. No swelling or hemorrhage. Moderate chronic small-vessel ischemic changes elsewhere throughout the cerebral hemispheric white matter. Electronically Signed   By: Nelson Chimes M.D.   On: 09/30/2019 16:02   ECHOCARDIOGRAM COMPLETE  Result Date: 09/30/2019    ECHOCARDIOGRAM REPORT   Patient Name:   KARELI HOSSAIN Date of Exam: 09/30/2019 Medical Rec #:  [id number]        Height:       63.0 in Accession #:    [id number]       Weight:       167.0 lb Date of Birth:  Feb 26, 1946        BSA:          1.791 m Patient Age:    20 years         BP:           102/81 mmHg Patient Gender: F                HR:           91 bpm. Exam Location:  [facility] Procedure: 2D Echo, Color Doppler and Cardiac Doppler Indications:     Atrial fibrillation 427.31  History:         Patient has no prior history of Echocardiogram examinations. No                   medical history on file.  Sonographer:     Sherrie Sport RDCS (AE) Referring Phys:  [id number] [place] Diagnosing Phys: Kathlyn Sacramento MD  Sonographer Comments: Suboptimal apical window. IMPRESSIONS  1. Left ventricular ejection fraction, by estimation, is 50 to 55%. The left ventricle has low normal function. The left ventricle has no regional wall motion abnormalities. There is mild left ventricular hypertrophy. Left ventricular diastolic parameters are indeterminate.  2. Right ventricular systolic function is normal. The right ventricular size is normal. There is mildly elevated pulmonary artery systolic pressure.  3. Left atrial size was mildly dilated.  4. Right atrial size was moderately dilated.  5. The mitral valve is normal in structure. Mild to moderate mitral valve regurgitation. No evidence of mitral stenosis.  6. The aortic valve is normal in structure. Aortic valve regurgitation is not visualized. No aortic stenosis is present. FINDINGS  Left Ventricle: Left ventricular ejection fraction, by estimation, is 50 to 55%. The left ventricle has low normal function. The left ventricle has no regional wall motion abnormalities. The left ventricular internal cavity size  was normal in size. There is mild left ventricular hypertrophy. Left ventricular diastolic parameters are indeterminate. Right Ventricle: The right ventricular size is normal. No increase in right ventricular wall thickness. Right ventricular systolic function is normal. There is mildly elevated pulmonary artery systolic pressure. The tricuspid regurgitant velocity is 2.86  m/s, and with an assumed right atrial pressure of 10 mmHg, the estimated right ventricular systolic pressure is 83.4 mmHg. Left Atrium: Left atrial size was mildly dilated. Right Atrium: Right atrial size was moderately dilated. Pericardium: Trivial pericardial effusion is present. The pericardial effusion is posterior to the left ventricle. Mitral Valve: The  mitral valve is normal in structure. Normal mobility of the mitral valve leaflets. Mild to moderate mitral valve regurgitation. No evidence of mitral valve stenosis. Tricuspid Valve: The tricuspid valve is normal in structure. Tricuspid valve regurgitation is trivial. No evidence of tricuspid stenosis. Aortic Valve: The aortic valve is normal in structure. Aortic valve regurgitation is not visualized. No aortic stenosis is present. Aortic valve mean gradient measures 2.0 mmHg. Aortic valve peak gradient measures 3.9 mmHg. Aortic valve area, by VTI measures 2.60 cm. Pulmonic Valve: The pulmonic valve was normal in structure. Pulmonic valve regurgitation is mild. No evidence of pulmonic stenosis. Aorta: The aortic root is normal in size and structure. Venous: The inferior vena cava was not well visualized. IAS/Shunts: No atrial level shunt detected by color flow Doppler.  LEFT VENTRICLE PLAX 2D LVIDd:         3.96 cm LVIDs:         2.87 cm LV PW:         1.11 cm LV IVS:        1.18 cm LVOT diam:     2.00 cm LV SV:         45 LV SV Index:   25 LVOT Area:     3.14 cm  RIGHT VENTRICLE RV Basal diam:  4.48 cm RV S prime:     8.38 cm/s TAPSE (M-mode): 3.7 cm LEFT ATRIUM           Index       RIGHT ATRIUM           Index LA diam:      4.50 cm 2.51 cm/m  RA Area:     48.80 cm LA Vol (A2C): 36.2 ml 20.21 ml/m RA Volume:   226.00 ml 126.19 ml/m LA Vol (A4C): 75.1 ml 41.93 ml/m  AORTIC VALVE                   PULMONIC VALVE AV Area (Vmax):    2.70 cm    PV Vmax:        0.61 m/s AV Area (Vmean):   2.48 cm    PV Peak grad:   1.5 mmHg AV Area (VTI):     2.60 cm    RVOT Peak grad: 2 mmHg AV Vmax:           98.15 cm/s AV Vmean:          69.800 cm/s AV VTI:            0.174 m AV Peak Grad:      3.9 mmHg AV Mean Grad:      2.0 mmHg LVOT Vmax:         84.20 cm/s LVOT Vmean:        55.000 cm/s LVOT VTI:          0.144 m LVOT/AV VTI ratio:  0.83  AORTA Ao Root diam: 2.80 cm MITRAL VALVE                TRICUSPID VALVE MV Area  (PHT): 4.80 cm     TR Peak grad:   32.7 mmHg MV Decel Time: 158 msec     TR Vmax:        286.00 cm/s MV E velocity: 108.00 cm/s                             SHUNTS                             Systemic VTI:  0.14 m                             Systemic Diam: 2.00 cm Kathlyn Sacramento MD Electronically signed by Kathlyn Sacramento MD Signature Date/Time: 09/30/2019/2:32:00 PM    Final        Subjective: Pt c/o malaise    Discharge Exam: Vitals:   10/01/19 1214 10/01/19 1249  BP: (!) 176/85   Pulse: 64 85  Resp: 18   Temp: 97.7 F (36.5 C)   SpO2: 100% 98%   Vitals:   10/01/19 0750 10/01/19 1020 10/01/19 1214 10/01/19 1249  BP: (!) 165/104  (!) 176/85   Pulse: 59 76 64 85  Resp: 17  18   Temp: 98 F (36.7 C)  97.7 F (36.5 C)   TempSrc: Oral  Oral   SpO2: 100% 100% 100% 98%  Weight:      Height:        General: Pt is alert, awake, not in acute distress Cardiovascular:  S1/S2 +, no rubs, no gallops Respiratory: CTA bilaterally, no wheezing, no rhonchi Abdominal: Soft, NT, ND, bowel sounds + Extremities: no edema, no cyanosis    The results of significant diagnostics from this hospitalization (including imaging, microbiology, ancillary and laboratory) are listed below for reference.     Microbiology: Recent Results (from the past 240 hour(s))  SARS Coronavirus 2 by RT PCR (hospital order, performed in [facility] hospital lab) Nasopharyngeal Nasopharyngeal Swab     Status: None   Collection Time: 09/30/19 10:10 AM   Specimen: Nasopharyngeal Swab  Result Value Ref Range Status   SARS Coronavirus 2 NEGATIVE NEGATIVE Final    Comment: (NOTE) SARS-CoV-2 target nucleic acids are NOT DETECTED.  The SARS-CoV-2 RNA is generally detectable in upper and lower respiratory specimens during the acute phase of infection. The lowest concentration of SARS-CoV-2 viral copies this assay can detect is 250 copies / mL. A negative result does not preclude SARS-CoV-2 infection and should not be  used as the sole basis for treatment or other patient management decisions.  A negative result may occur with improper specimen collection / handling, submission of specimen other than nasopharyngeal swab, presence of viral mutation(s) within the areas targeted by this assay, and inadequate number of viral copies (<250 copies / mL). A negative result must be combined with clinical observations, patient history, and epidemiological information.  Fact Sheet for Patients:   StrictlyIdeas.no  Fact Sheet for Healthcare Providers: BankingDealers.co.za  This test is not yet approved or  cleared by the [place] FDA and has been authorized for detection and/or diagnosis of SARS-CoV-2 by FDA under an Emergency Use Authorization (EUA).  This EUA will remain in effect (  meaning this test can be used) for the duration of the COVID-19 declaration under Section 564(b)(1) of the Act, 21 U.S.C. section 360bbb-3(b)(1), unless the authorization is terminated or revoked sooner.  Performed at [facility], [address] [place], [place] [address]      Labs: BNP (last 3 results) No results for input(s): BNP in the last 8760 hours. Basic Metabolic Panel: Recent Labs  Lab 09/30/19 0922 10/01/19 0610  NA 140 140  K 4.2 3.6  CL 106 107  CO2 26 23  GLUCOSE 117* 95  BUN 17 15  CREATININE 0.87 0.84  CALCIUM 9.5 9.2  MG 2.0  --    Liver Function Tests: Recent Labs  Lab 09/30/19 0922  AST 28  ALT 24  ALKPHOS 80  BILITOT 1.4*  PROT 7.1  ALBUMIN 4.0   No results for input(s): LIPASE, AMYLASE in the last 168 hours. No results for input(s): AMMONIA in the last 168 hours. CBC: Recent Labs  Lab 09/30/19 0922 10/01/19 0610  WBC 9.1 8.3  NEUTROABS 6.7  --   HGB 13.6 12.8  HCT 40.9 39.4  MCV 89.7 92.7  PLT 1,057* 954*   Cardiac Enzymes: No results for input(s): CKTOTAL, CKMB, CKMBINDEX, TROPONINI in the last 168  hours. BNP: Invalid input(s): POCBNP CBG: No results for input(s): GLUCAP in the last 168 hours. D-Dimer No results for input(s): DDIMER in the last 72 hours. Hgb A1c Recent Labs    10/01/19 0610  HGBA1C 6.1*   Lipid Profile Recent Labs    10/01/19 0610  CHOL 223*  HDL 60  LDLCALC 146*  TRIG 83  CHOLHDL 3.7   Thyroid function studies Recent Labs    09/30/19 0922  TSH 2.712   Anemia work up No results for input(s): VITAMINB12, FOLATE, FERRITIN, TIBC, IRON, RETICCTPCT in the last 72 hours. Urinalysis No results found for: COLORURINE, APPEARANCEUR, [place], [place], [place], [place], [place], [place], PROTEINUR, UROBILINOGEN, NITRITE, LEUKOCYTESUR Sepsis Labs Invalid input(s): PROCALCITONIN,  WBC,  LACTICIDVEN Microbiology Recent Results (from the past 240 hour(s))  SARS Coronavirus 2 by RT PCR (hospital order, performed in [facility] hospital lab) Nasopharyngeal Nasopharyngeal Swab     Status: None   Collection Time: 09/30/19 10:10 AM   Specimen: Nasopharyngeal Swab  Result Value Ref Range Status   SARS Coronavirus 2 NEGATIVE NEGATIVE Final    Comment: (NOTE) SARS-CoV-2 target nucleic acids are NOT DETECTED.  The SARS-CoV-2 RNA is generally detectable in upper and lower respiratory specimens during the acute phase of infection. The lowest concentration of SARS-CoV-2 viral copies this assay can detect is 250 copies / mL. A negative result does not preclude SARS-CoV-2 infection and should not be used as the sole basis for treatment or other patient management decisions.  A negative result may occur with improper specimen collection / handling, submission of specimen other than nasopharyngeal swab, presence of viral mutation(s) within the areas targeted by this assay, and inadequate number of viral copies (<250 copies / mL). A negative result must be combined with clinical observations, patient history, and epidemiological information.  Fact Sheet for  Patients:   StrictlyIdeas.no  Fact Sheet for Healthcare Providers: BankingDealers.co.za  This test is not yet approved or  cleared by the [place] FDA and has been authorized for detection and/or diagnosis of SARS-CoV-2 by FDA under an Emergency Use Authorization (EUA).  This EUA will remain in effect (meaning this test can be used) for the duration of the COVID-19 declaration under Section 564(b)(1) of the  Act, 21 U.S.C. section 360bbb-3(b)(1), unless the authorization is terminated or revoked sooner.  Performed at [facility], [address], [place], [place] [address]      Time coordinating discharge: Over 30 minutes  SIGNED:   Wyvonnia Dusky, MD  [facility] 10/01/2019, 2:50 PM Pager   If 7PM-7AM, please contact night-coverage [email or web address]

## 2019-10-01 NOTE — Evaluation (Signed)
Occupational Therapy Evaluation Patient Details Name: Kristy Sullivan MRN: [id number] DOB: Nov 30, 1945 Today's Date: 10/01/2019    History of Present Illness 74 yo Female presents to ED with dizziness and left sided weakness. She had been walking around her neighborhood and started feeling dizzy. Upon presentation to the ED she was found to be in A-fib with RVR. 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 She does have elevated troponin likely related to demand ischemia. Patient has PMH significant for HTN, thrombocytosis; No recent falls;   Clinical Impression   Kristy Sullivan appears to be close to her baseline level of functioning.  She presented to ED with complaints of dizziness and L side weakness, but reports her symptoms have all resolved.  Prior to admission, pt was independent in all ADLs and IADLs including working part time, exercising, and driving.  Pt is currently independent in all BADLs including functional transfers, no AD needed.  Pt denies any numbness, tingling, or changes in vision.  She is grossly oriented and suspect no cognitive deficits.  Do not suspect any need for follow up skilled OT services in acute setting, will discharge pt from OT caseload.  Please re-consult if pt has change in functional status or additional OT-related needs arise.    Follow Up Recommendations  No OT follow up    Equipment Recommendations  None recommended by OT    Recommendations for Other Services       Precautions / Restrictions Precautions Precautions: None Restrictions Weight Bearing Restrictions: No      Mobility Bed Mobility Overal bed mobility: Independent                Transfers Overall transfer level: Independent Equipment used: None                  Balance Overall balance assessment: Independent                                         ADL either performed or assessed with clinical judgement   ADL Overall ADL's :  Independent;At baseline                                       General ADL Comments: Pt is generally at her baseline level of functioning.  Pt able to dress, bathe, feed, groom, and perform functional transfers with independence.     Vision Patient Visual Report: No change from baseline       Perception     Praxis      Pertinent Vitals/Pain Pain Assessment: No/denies pain     Hand Dominance Right   Extremity/Trunk Assessment Upper Extremity Assessment Upper Extremity Assessment: Overall WFL for tasks assessed   Lower Extremity Assessment Lower Extremity Assessment: Overall WFL for tasks assessed   Cervical / Trunk Assessment Cervical / Trunk Assessment: Normal   Communication Communication Communication: No difficulties   Cognition Arousal/Alertness: Awake/alert Behavior During Therapy: WFL for tasks assessed/performed Overall Cognitive Status: Within Functional Limits for tasks assessed                                 General Comments: grossly oriented, pleasant and engaged throughout evaluation   General Comments  pt denies numbness, tingling, any changes in  vision    Exercises Other Exercises Other Exercises: provided education re: OT role and plan of care   Shoulder Instructions      Home Living Family/patient expects to be discharged to:: Private residence Living Arrangements: Spouse/significant other Available Help at Discharge: Family;Available 24 hours/day Type of Home: House Home Access: Stairs to enter [employer] of Steps: 2-3 Entrance Stairs-Rails: None Home Layout: One level     Bathroom Shower/Tub: Teacher, early years/pre: Standard Bathroom Accessibility: No   Home Equipment: None          Prior Functioning/Environment Level of Independence: Independent        Comments: pt still driving; 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; Exercises daily;        OT  Problem List:        OT Treatment/Interventions:      OT Goals(Current goals can be found in the care plan section) Acute Rehab OT Goals Patient Stated Goal: to go home OT Goal Formulation: With patient  OT Frequency:     Barriers to D/C:            Co-evaluation              AM-PAC OT "6 Clicks" Daily Activity     Outcome Measure Help from another person eating meals?: None Help from another person taking care of personal grooming?: None Help from another person toileting, which includes using toliet, bedpan, or urinal?: None Help from another person bathing (including washing, rinsing, drying)?: None Help from another person to put on and taking off regular upper body clothing?: None Help from another person to put on and taking off regular lower body clothing?: None 6 Click Score: 24   End of Session    Activity Tolerance: Patient tolerated treatment well Patient left: in bed;with call bell/phone within reach  OT Visit Diagnosis: Other symptoms and signs involving the nervous system (A16.553)                Time: 7482-7078 OT Time Calculation (min): 9 min Charges:  OT General Charges $OT Visit: 1 Visit OT Evaluation $OT Eval Low Complexity: 1 Low  Myrtie Hawk Brantlee Hinde, OTR/L 10/01/19, 2:09 PM

## 2019-10-01 NOTE — Progress Notes (Signed)
Pt returned from CT. Noted IV infiltration to the R-upper arm. Pt. stated it happened in CT after contrast was injected. IV site about 2 inch length swollen. Pt. denied pain. Site warm, no erythema. IV removed. Pt made aware of the above, calm and understanding.  Ice pack offered, but pt. Declined.  Recommended to keep arm elevated. Dr. Jimmye Norman notified of the above. No new orders.  Per MD no need new IV cath due to pt being discharged.    Per MD no longer needs to collect urine drug screen.

## 2019-10-01 NOTE — Progress Notes (Signed)
SLP Cancellation Note  Patient Details Name: Kristy Sullivan MRN: [id number] DOB: 07-30-1945   Cancelled treatment:       Reason Eval/Treat Not Completed: SLP screened, no needs identified, will sign off (chart reviewed; consulted NSG then met w/ pt) Pt denied any difficulty swallowing and is currently on a regular diet; tolerates swallowing pills w/ water per NSG. Pt conversed at conversational level w/out deficits noted w/ both SLP and other staff members/NSG in room; pt and NSG denied any speech-language deficits.  No further skilled ST services indicated as pt appears at her baseline. Pt agreed. NSG to reconsult if any change in status while admitted.    Orinda Kenner, MS, CCC-SLP [facility] 10/01/2019, 11:00 AM

## 2019-10-01 NOTE — Progress Notes (Signed)
Subjective:  Back to baseline.    History reviewed. No pertinent past medical history.  History reviewed. No pertinent surgical history.  Family History  Problem Relation Age of Onset   High blood pressure Mother    Hypertension Brother     Social History:  reports that she has never smoked. She has never used smokeless tobacco. She reports that she does not drink alcohol and does not use drugs.  No Known Allergies  Medications: I have reviewed the patient's current medications.   Physical Examination: Blood pressure (!) 176/85, pulse 64, temperature 97.7 F (36.5 C), temperature source Oral, resp. rate 18, height 5\' 3"  (1.6 m), weight 75.4 kg, SpO2 100 %.  Neurological Examination   Mental Status: Alert, oriented, thought content appropriate.  Speech fluent without evidence of aphasia.  Able to follow 3 step commands without difficulty. Cranial Nerves: II: Discs flat bilaterally; Visual fields grossly normal, pupils equal, round, reactive to light and accommodation III,IV, VI: ptosis not present, extra-ocular motions intact bilaterally V,VII: smile symmetric, facial light touch sensation normal bilaterally VIII: hearing normal bilaterally IX,X: gag reflex present XI: bilateral shoulder shrug XII: midline tongue extension Motor: Right : Upper extremity   5/5    Left:     Upper extremity   5/5  Lower extremity   5/5     Lower extremity   5/5 Tone and bulk:normal tone throughout; no atrophy noted Sensory: Pinprick and light touch intact throughout, bilaterally Deep Tendon Reflexes: 1+ and symmetric throughout Plantars: Right: downgoing   Left: downgoing Cerebellar: normal finger-to-nose      Laboratory Studies:   Basic Metabolic Panel: Recent Labs  Lab 09/30/19 0922 10/01/19 0610  NA 140 140  K 4.2 3.6  CL 106 107  CO2 26 23  GLUCOSE 117* 95  BUN 17 15  CREATININE 0.87 0.84  CALCIUM 9.5 9.2  MG 2.0  --     Liver Function Tests: Recent Labs  Lab  09/30/19 0922  AST 28  ALT 24  ALKPHOS 80  BILITOT 1.4*  PROT 7.1  ALBUMIN 4.0   No results for input(s): LIPASE, AMYLASE in the last 168 hours. No results for input(s): AMMONIA in the last 168 hours.  CBC: Recent Labs  Lab 09/30/19 0922 10/01/19 0610  WBC 9.1 8.3  NEUTROABS 6.7  --   HGB 13.6 12.8  HCT 40.9 39.4  MCV 89.7 92.7  PLT 1,057* 954*    Cardiac Enzymes: No results for input(s): CKTOTAL, CKMB, CKMBINDEX, TROPONINI in the last 168 hours.  BNP: Invalid input(s): POCBNP  CBG: No results for input(s): GLUCAP in the last 168 hours.  Microbiology: Results for orders placed or performed during the hospital encounter of 09/30/19  SARS Coronavirus 2 by RT PCR (hospital order, performed in [facility] hospital lab) Nasopharyngeal Nasopharyngeal Swab     Status: None   Collection Time: 09/30/19 10:10 AM   Specimen: Nasopharyngeal Swab  Result Value Ref Range Status   SARS Coronavirus 2 NEGATIVE NEGATIVE Final    Comment: (NOTE) SARS-CoV-2 target nucleic acids are NOT DETECTED.  The SARS-CoV-2 RNA is generally detectable in upper and lower respiratory specimens during the acute phase of infection. The lowest concentration of SARS-CoV-2 viral copies this assay can detect is 250 copies / mL. A negative result does not preclude SARS-CoV-2 infection and should not be used as the sole basis for treatment or other patient management decisions.  A negative result may occur with improper specimen collection / handling, submission of  specimen other than nasopharyngeal swab, presence of viral mutation(s) within the areas targeted by this assay, and inadequate number of viral copies (<250 copies / mL). A negative result must be combined with clinical observations, patient history, and epidemiological information.  Fact Sheet for Patients:   StrictlyIdeas.no  Fact Sheet for Healthcare Providers: BankingDealers.co.za  This  test is not yet approved or  cleared by the [place] FDA and has been authorized for detection and/or diagnosis of SARS-CoV-2 by FDA under an Emergency Use Authorization (EUA).  This EUA will remain in effect (meaning this test can be used) for the duration of the COVID-19 declaration under Section 564(b)(1) of the Act, 21 U.S.C. section 360bbb-3(b)(1), unless the authorization is terminated or revoked sooner.  Performed at [facility], [address] [place], [place] [address]     Coagulation Studies: No results for input(s): LABPROT, INR in the last 72 hours.  Urinalysis: No results for input(s): COLORURINE, LABSPEC, PHURINE, GLUCOSEU, HGBUR, BILIRUBINUR, KETONESUR, PROTEINUR, UROBILINOGEN, NITRITE, LEUKOCYTESUR in the last 168 hours.  Invalid input(s): APPERANCEUR  Lipid Panel:     Component Value Date/Time   CHOL 223 (H) 10/01/2019 0610   TRIG 83 10/01/2019 0610   HDL 60 10/01/2019 0610   CHOLHDL 3.7 10/01/2019 0610   VLDL 17 10/01/2019 0610   LDLCALC 146 (H) 10/01/2019 0610    HgbA1C: No results found for: HGBA1C  Urine Drug Screen:  No results found for: LABOPIA, COCAINSCRNUR, LABBENZ, AMPHETMU, THCU, LABBARB  Alcohol Level: No results for input(s): ETH in the last 168 hours.  Other results: EKG: a-fib with RVR.  Imaging: CT Head Wo Contrast  Result Date: 09/30/2019 CLINICAL DATA:  TIA.  Syncopal episode today. EXAM: CT HEAD WITHOUT CONTRAST TECHNIQUE: Contiguous axial images were obtained from the base of the skull through the vertex without intravenous contrast. COMPARISON:  None. FINDINGS: Brain: Mild generalized white matter hypoattenuation is likely within normal limits for age. No acute infarct, hemorrhage, or mass lesion is present. The ventricles are of normal size. No significant extraaxial fluid collection is present. The brainstem and cerebellum are within normal limits. Vascular: No hyperdense vessel or unexpected calcification. Skull:  Calvarium is intact. No focal lytic or blastic lesions are present. No significant extracranial soft tissue lesion is present. Sinuses/Orbits: The paranasal sinuses and mastoid air cells are clear. The globes and orbits are within normal limits. IMPRESSION: Normal CT appearance of the brain for age. Electronically Signed   By: San Morelle M.D.   On: 09/30/2019 09:11   MR BRAIN WO CONTRAST  Result Date: 09/30/2019 CLINICAL DATA:  Left-sided weakness and near syncope. EXAM: MRI HEAD WITHOUT CONTRAST TECHNIQUE: Multiplanar, multiecho pulse sequences of the brain and surrounding structures were obtained without intravenous contrast. 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 No confluent or large vessel territory infarction. No mass lesion, hemorrhage, hydrocephalus or extra-axial collection. Background pattern of moderate chronic small-vessel ischemic changes throughout both cerebral hemispheres. Vascular: Major vessels at the base of the brain show flow. Skull and upper cervical spine: Negative Sinuses/Orbits: Clear/normal Other: Right mastoid effusion. 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 No confluent infarction. No swelling or hemorrhage. Moderate chronic small-vessel ischemic  changes elsewhere throughout the cerebral hemispheric white matter. Electronically Signed   By: Nelson Chimes M.D.   On: 09/30/2019 16:02   ECHOCARDIOGRAM COMPLETE  Result Date: 09/30/2019    ECHOCARDIOGRAM REPORT   Patient Name:   EMMILYN CROOKE Date of Exam: 09/30/2019 Medical Rec #:  [id number]        Height:       63.0 in Accession #:     [id number]       Weight:       167.0 lb Date of Birth:  [birth year]        BSA:          1.791 m Patient Age:    74 years         BP:           102/81 mmHg Patient Gender: F                HR:           91 bpm. Exam Location:  [facility] Procedure: 2D Echo, Color Doppler and Cardiac Doppler Indications:     Atrial fibrillation 427.31  History:         Patient has no prior history of Echocardiogram examinations. No                  medical history on file.  Sonographer:     Sherrie Sport RDCS (AE) Referring Phys:  [id number] [place] Diagnosing Phys: Kathlyn Sacramento MD  Sonographer Comments: Suboptimal apical window. IMPRESSIONS  1. Left ventricular ejection fraction, by estimation, is 50 to 55%. The left ventricle has low normal function. The left ventricle has no regional wall motion abnormalities. There is mild left ventricular hypertrophy. Left ventricular diastolic parameters are indeterminate.  2. Right ventricular systolic function is normal. The right ventricular size is normal. There is mildly elevated pulmonary artery systolic pressure.  3. Left atrial size was mildly dilated.  4. Right atrial size was moderately dilated.  5. The mitral valve is normal in structure. Mild to moderate mitral valve regurgitation. No evidence of mitral stenosis.  6. The aortic valve is normal in structure. Aortic valve regurgitation is not visualized. No aortic stenosis is present. FINDINGS  Left Ventricle: Left ventricular ejection fraction, by estimation, is 50 to 55%. The left ventricle has low normal function. The left ventricle has no regional wall motion abnormalities. The left ventricular internal cavity size was normal in size. There is mild left ventricular hypertrophy. Left ventricular diastolic parameters are indeterminate. Right Ventricle: The right ventricular size is normal. No increase in right ventricular wall thickness. Right ventricular systolic function is normal. There is mildly elevated pulmonary artery  systolic pressure. The tricuspid regurgitant velocity is 2.86  m/s, and with an assumed right atrial pressure of 10 mmHg, the estimated right ventricular systolic pressure is 82.9 mmHg. Left Atrium: Left atrial size was mildly dilated. Right Atrium: Right atrial size was moderately dilated. Pericardium: Trivial pericardial effusion is present. The pericardial effusion is posterior to the left ventricle. Mitral Valve: The mitral valve is normal in structure. Normal mobility of the mitral valve leaflets. Mild to moderate mitral valve regurgitation. No evidence of mitral valve stenosis. Tricuspid Valve: The tricuspid valve is normal in structure. Tricuspid valve regurgitation is trivial. No evidence of tricuspid stenosis. Aortic Valve: The aortic valve is normal in structure. Aortic valve regurgitation is not visualized. No aortic stenosis is present. Aortic valve mean gradient measures 2.0 mmHg. Aortic valve peak gradient measures 3.9  mmHg. Aortic valve area, by VTI measures 2.60 cm. Pulmonic Valve: The pulmonic valve was normal in structure. Pulmonic valve regurgitation is mild. No evidence of pulmonic stenosis. Aorta: The aortic root is normal in size and structure. Venous: The inferior vena cava was not well visualized. IAS/Shunts: No atrial level shunt detected by color flow Doppler.  LEFT VENTRICLE PLAX 2D LVIDd:         3.96 cm LVIDs:         2.87 cm LV PW:         1.11 cm LV IVS:        1.18 cm LVOT diam:     2.00 cm LV SV:         45 LV SV Index:   25 LVOT Area:     3.14 cm  RIGHT VENTRICLE RV Basal diam:  4.48 cm RV S prime:     8.38 cm/s TAPSE (M-mode): 3.7 cm LEFT ATRIUM           Index       RIGHT ATRIUM           Index LA diam:      4.50 cm 2.51 cm/m  RA Area:     48.80 cm LA Vol (A2C): 36.2 ml 20.21 ml/m RA Volume:   226.00 ml 126.19 ml/m LA Vol (A4C): 75.1 ml 41.93 ml/m  AORTIC VALVE                   PULMONIC VALVE AV Area (Vmax):    2.70 cm    PV Vmax:        0.61 m/s AV Area (Vmean):   2.48  cm    PV Peak grad:   1.5 mmHg AV Area (VTI):     2.60 cm    RVOT Peak grad: 2 mmHg AV Vmax:           98.15 cm/s AV Vmean:          69.800 cm/s AV VTI:            0.174 m AV Peak Grad:      3.9 mmHg AV Mean Grad:      2.0 mmHg LVOT Vmax:         84.20 cm/s LVOT Vmean:        55.000 cm/s LVOT VTI:          0.144 m LVOT/AV VTI ratio: 0.83  AORTA Ao Root diam: 2.80 cm MITRAL VALVE                TRICUSPID VALVE MV Area (PHT): 4.80 cm     TR Peak grad:   32.7 mmHg MV Decel Time: 158 msec     TR Vmax:        286.00 cm/s MV E velocity: 108.00 cm/s                             SHUNTS                             Systemic VTI:  0.14 m                             Systemic Diam: 2.00 cm Kathlyn Sacramento MD Electronically signed by Kathlyn Sacramento MD Signature Date/Time: 09/30/2019/2:32:00 PM    Final  Assessment/Plan:   74 y.o. female with no past medical hx and not on any medications presents with with transient left sided weakness and near syncope. Pt states she woke up in her usual stateof health this AM. She went walking with a friend, as she does on most days. She had walked approx 1 mile and was returning home. She states when walking back inside, she experienced acute onset of left-sided leg and arm weakness, lightheadedness, and sensation she was going to fall. She fell down to the ground but did not hit her head and does not believe she hit her head. No reported LOC. She was found to be in A-fib with RVR.  Now back to baseline.    - A-fib rate control as per primary team along with thrombocytosis for 1,057 - transient L sided weakness that resolved - CTA h/n ordered would like to look at R MCA territory - strokes in setting of likely A-fib - ASA daily and start anticoagulation day 3 which is tomorrow from Neurological stand point  - Possibly d/c planning later today after CTa   10/01/2019, 12:44 PM

## 2019-10-01 NOTE — Progress Notes (Signed)
Pt has been discharged home.  Discharge papers given and explained to pt. Pt verbalized understanding.  Med and f/u appointments reviewed.  Rx sent electronically to pharmacy.  Pt made aware.

## 2019-10-01 NOTE — Progress Notes (Signed)
MT called to notify that patient had 3.05pause on monitor. RN assessed patient and observed her sleeping soundly. Breathing is even and unlabored. Will notify provider, and continue conducting frequent rounds on patient.

## 2019-10-01 NOTE — Progress Notes (Signed)
Progress Note  Patient Name: Kristy Sullivan Date of Encounter: 10/01/2019  [facility] Cardiologist: Kathlyn Sacramento, MD   Subjective   Denies CP or dyspnea  Inpatient Medications    Scheduled Meds: . apixaban  5 mg Oral BID  . atorvastatin  40 mg Oral Daily  . hydroxyurea  500 mg Oral BID  . metoprolol tartrate  25 mg Oral BID   Continuous Infusions: . sodium chloride 75 mL/hr at 10/01/19 0459   PRN Meds: acetaminophen, ondansetron (ZOFRAN) IV, senna-docusate   Vital Signs    Vitals:   09/30/19 2037 10/01/19 0019 10/01/19 0444 10/01/19 0750  BP: (!) 151/93 (!) 150/98 (!) 154/108 (!) 165/104  Pulse: 64 66 (!) 37 59  Resp: 20  17 17   Temp: 98.4 F (36.9 C) 97.9 F (36.6 C) 98.1 F (36.7 C) 98 F (36.7 C)  TempSrc: Oral Oral Oral Oral  SpO2: 98% 99% 98% 100%  Weight:   75.4 kg   Height:        Intake/Output Summary (Last 24 hours) at 10/01/2019 1014 Last data filed at 10/01/2019 1012 Gross per 24 hour  Intake 518.98 ml  Output --  Net 518.98 ml   Last 3 Weights 10/01/2019 09/30/2019  Weight (lbs) 166 lb 3.2 oz 167 lb  Weight (kg) 75.388 kg 75.751 kg      Telemetry    Atrial fibrillation, rate slow while sleeping- Personally Reviewed  Physical Exam   GEN: No acute distress.   Neck: No JVD Cardiac: irregular Respiratory: Clear to auscultation bilaterally. GI: Soft, nontender, non-distended  MS: No edema Neuro:  Nonfocal  Psych: Normal affect   Labs    High Sensitivity Troponin:   Recent Labs  Lab 09/30/19 0922 09/30/19 1108 09/30/19 2021 09/30/19 2226  TROPONINIHS 19* 21* 27* 31*      Chemistry Recent Labs  Lab 09/30/19 0922 10/01/19 0610  NA 140 140  K 4.2 3.6  CL 106 107  CO2 26 23  GLUCOSE 117* 95  BUN 17 15  CREATININE 0.87 0.84  CALCIUM 9.5 9.2  PROT 7.1  --   ALBUMIN 4.0  --   AST 28  --   ALT 24  --   ALKPHOS 80  --   BILITOT 1.4*  --   GFRNONAA >60 >60  GFRAA >60 >60  ANIONGAP 8 10     Hematology Recent  Labs  Lab 09/30/19 0922 10/01/19 0610  WBC 9.1 8.3  RBC 4.56 4.25  HGB 13.6 12.8  HCT 40.9 39.4  MCV 89.7 92.7  MCH 29.8 30.1  MCHC 33.3 32.5  RDW 15.6* 15.9*  PLT 1,057* 954*    Radiology    CT Head Wo Contrast  Result Date: 09/30/2019 CLINICAL DATA:  TIA.  Syncopal episode today. EXAM: CT HEAD WITHOUT CONTRAST TECHNIQUE: Contiguous axial images were obtained from the base of the skull through the vertex without intravenous contrast. COMPARISON:  None. FINDINGS: Brain: Mild generalized white matter hypoattenuation is likely within normal limits for age. No acute infarct, hemorrhage, or mass lesion is present. The ventricles are of normal size. No significant extraaxial fluid collection is present. The brainstem and cerebellum are within normal limits. Vascular: No hyperdense vessel or unexpected calcification. Skull: Calvarium is intact. No focal lytic or blastic lesions are present. No significant extracranial soft tissue lesion is present. Sinuses/Orbits: The paranasal sinuses and mastoid air cells are clear. The globes and orbits are within normal limits. IMPRESSION: Normal CT appearance of the brain for  age. Electronically Signed   By: San Morelle M.D.   On: 09/30/2019 09:11   MR BRAIN WO CONTRAST  Result Date: 09/30/2019 CLINICAL DATA:  Left-sided weakness and near syncope. EXAM: MRI HEAD WITHOUT CONTRAST TECHNIQUE: Multiplanar, multiecho pulse sequences of the brain and surrounding structures were obtained without intravenous contrast. 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 No confluent or large vessel territory infarction. No mass lesion, hemorrhage, hydrocephalus or extra-axial collection. Background pattern of moderate chronic small-vessel ischemic  changes throughout both cerebral hemispheres. Vascular: Major vessels at the base of the brain show flow. Skull and upper cervical spine: Negative Sinuses/Orbits: Clear/normal Other: Right mastoid effusion. 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 No confluent infarction. No swelling or hemorrhage. Moderate chronic small-vessel ischemic changes elsewhere throughout the cerebral hemispheric white matter. Electronically Signed   By: Nelson Chimes M.D.   On: 09/30/2019 16:02   ECHOCARDIOGRAM COMPLETE  Result Date: 09/30/2019    ECHOCARDIOGRAM REPORT   Patient Name:   Kristy Sullivan Date of Exam: 09/30/2019 Medical Rec #:  [id number]        Height:       63.0 in Accession #:    [id number]       Weight:       167.0 lb Date of Birth:  1945/06/25        BSA:          1.791 m Patient Age:    74 years         BP:           102/81 mmHg Patient Gender: F                HR:           91 bpm. Exam Location:  [facility] Procedure: 2D Echo, Color Doppler and Cardiac Doppler Indications:     Atrial fibrillation 427.31  History:         Patient has no prior history of Echocardiogram examinations. No                  medical history on file.  Sonographer:     Sherrie Sport RDCS (AE) Referring Phys:  [id number] [place] Diagnosing Phys: Kathlyn Sacramento MD  Sonographer Comments: Suboptimal apical window. IMPRESSIONS  1. Left ventricular ejection fraction, by estimation, is 50 to 55%. The left ventricle has low normal function. The left ventricle has no regional wall motion abnormalities. There is mild left ventricular hypertrophy. Left ventricular diastolic parameters are indeterminate.  2. Right ventricular systolic function is normal. The right ventricular size is normal. There is mildly elevated pulmonary artery systolic pressure.  3. Left atrial size was mildly  dilated.  4. Right atrial size was moderately dilated.  5. The mitral valve is normal in structure. Mild to moderate mitral valve regurgitation. No evidence of mitral stenosis.  6. The aortic valve is normal in structure. Aortic valve regurgitation is not visualized. No aortic stenosis is present. FINDINGS  Left Ventricle: Left ventricular ejection fraction, by estimation, is 50 to 55%. The left ventricle has low normal function. The left ventricle has no regional wall motion abnormalities. The left ventricular internal cavity size  was normal in size. There is mild left ventricular hypertrophy. Left ventricular diastolic parameters are indeterminate. Right Ventricle: The right ventricular size is normal. No increase in right ventricular wall thickness. Right ventricular systolic function is normal. There is mildly elevated pulmonary artery systolic pressure. The tricuspid regurgitant velocity is 2.86  m/s, and with an assumed right atrial pressure of 10 mmHg, the estimated right ventricular systolic pressure is 62.6 mmHg. Left Atrium: Left atrial size was mildly dilated. Right Atrium: Right atrial size was moderately dilated. Pericardium: Trivial pericardial effusion is present. The pericardial effusion is posterior to the left ventricle. Mitral Valve: The mitral valve is normal in structure. Normal mobility of the mitral valve leaflets. Mild to moderate mitral valve regurgitation. No evidence of mitral valve stenosis. Tricuspid Valve: The tricuspid valve is normal in structure. Tricuspid valve regurgitation is trivial. No evidence of tricuspid stenosis. Aortic Valve: The aortic valve is normal in structure. Aortic valve regurgitation is not visualized. No aortic stenosis is present. Aortic valve mean gradient measures 2.0 mmHg. Aortic valve peak gradient measures 3.9 mmHg. Aortic valve area, by VTI measures 2.60 cm. Pulmonic Valve: The pulmonic valve was normal in structure. Pulmonic valve regurgitation is mild.  No evidence of pulmonic stenosis. Aorta: The aortic root is normal in size and structure. Venous: The inferior vena cava was not well visualized. IAS/Shunts: No atrial level shunt detected by color flow Doppler.  LEFT VENTRICLE PLAX 2D LVIDd:         3.96 cm LVIDs:         2.87 cm LV PW:         1.11 cm LV IVS:        1.18 cm LVOT diam:     2.00 cm LV SV:         45 LV SV Index:   25 LVOT Area:     3.14 cm  RIGHT VENTRICLE RV Basal diam:  4.48 cm RV S prime:     8.38 cm/s TAPSE (M-mode): 3.7 cm LEFT ATRIUM           Index       RIGHT ATRIUM           Index LA diam:      4.50 cm 2.51 cm/m  RA Area:     48.80 cm LA Vol (A2C): 36.2 ml 20.21 ml/m RA Volume:   226.00 ml 126.19 ml/m LA Vol (A4C): 75.1 ml 41.93 ml/m  AORTIC VALVE                   PULMONIC VALVE AV Area (Vmax):    2.70 cm    PV Vmax:        0.61 m/s AV Area (Vmean):   2.48 cm    PV Peak grad:   1.5 mmHg AV Area (VTI):     2.60 cm    RVOT Peak grad: 2 mmHg AV Vmax:           98.15 cm/s AV Vmean:          69.800 cm/s AV VTI:            0.174 m AV Peak Grad:      3.9 mmHg AV Mean Grad:      2.0 mmHg LVOT Vmax:         84.20 cm/s LVOT Vmean:        55.000 cm/s LVOT VTI:          0.144 m LVOT/AV VTI ratio:  0.83  AORTA Ao Root diam: 2.80 cm MITRAL VALVE                TRICUSPID VALVE MV Area (PHT): 4.80 cm     TR Peak grad:   32.7 mmHg MV Decel Time: 158 msec     TR Vmax:        286.00 cm/s MV E velocity: 108.00 cm/s                             SHUNTS                             Systemic VTI:  0.14 m                             Systemic Diam: 2.00 cm Kathlyn Sacramento MD Electronically signed by Kathlyn Sacramento MD Signature Date/Time: 09/30/2019/2:32:00 PM    Final    Patient Profile     74 y.o. female admitted with CVA and newly diagnosed atrial fibrillation.  Also with thrombocytosis.  Echocardiogram this admission shows ejection fraction 50 to 55%, mild left ventricular hypertrophy, biatrial enlargement, mild to moderate mitral  regurgitation.  Assessment & Plan    1 newly diagnosed atrial fibrillation-patient presented with CVA and newly diagnosed atrial fibrillation.  She was placed on metoprolol 25 mg twice daily.  Review of telemetry shows bradycardia while asleep.  We will decrease to 12.5 mg twice daily.  TSH is normal.  Echocardiogram shows normal LV function. CHADSvasc 6.  Would continue apixaban 5 mg twice daily.  She will need lifelong anticoagulation.  Given that she is essentially asymptomatic best option may be rate control and anticoagulation.  She will follow-up in the office for further discussion concerning this issue.  2 recent CVA-managed by neurology.  3 newly diagnosed hypertension-patient's blood pressure remains elevated.  We will continue low-dose metoprolol.  Permissive hypertension in the setting of acute CVA.  Would add additional medications when okay with neurology.  Would consider amlodipine 5 mg daily or losartan 50 mg daily and adjust as needed.  4 hyperlipidemia-continue statin.  Check lipids and liver 12 weeks following discharge.  5 minimally elevated troponin-no clear trend and no chest pain.  Would not pursue further ischemia evaluation.  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  Further management per hematology.  Patient can be discharged from a cardiac standpoint.  Needs follow-up in the office in 1 to 2 weeks.  For questions or updates, please contact [place] Please consult [email or web address] for contact info under        Signed, Kirk Ruths, MD  10/01/2019, 10:14 AM

## 2019-10-01 NOTE — Progress Notes (Deleted)
OT Cancellation Note  Patient Details Name: Kristy Sullivan MRN: [id number] DOB: 1945/12/16   Cancelled Treatment:    Reason Eval/Treat Not Completed: Patient not medically ready   OT consult received, chart reviewed.  Pt has a platelet count of 954, which is a contraindication for therapeutic activity.  Will continue to follow up and attempt evaluation as pt is medically appropriate.  Myrtie Hawk Kathlene Yano, OTR/L 10/01/19, 12:42 PM

## 2019-10-01 NOTE — Evaluation (Signed)
Physical Therapy Evaluation Patient Details Name: Kristy Sullivan MRN: [id number] DOB: 1946-01-22 Today's Date: 10/01/2019   History of Present Illness  74 yo Female presents to ED with dizziness and left sided weakness. She had been walking around her neighborhood and started feeling dizzy. Upon presentation to the ED she was found to be in A-fib with RVR. 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 She does have elevated troponin likely related to demand ischemia. Patient has PMH significant for HTN, thrombocytosis; No recent falls;  Clinical Impression  74 yo Female s/p CVA reports feeling back at baseline. Initially she was experiencing dizziness and left sided weakness. However at time of PT evaluation patient reports that has resolved. She is independent in bed mobility, transfers and gait. Patient able to complete 5 times sit<>Stand in 12.5 sec indicating low fall risk. She also ambulated 200 feet with reciprocal gait pattern independently. She does walk at slightly slower gait speed but states this is her baseline. Patient does not exhibit any additional skilled PT intervention needs at this time. She is agreeable.     Follow Up Recommendations No PT follow up    Equipment Recommendations  None recommended by PT    Recommendations for Other Services       Precautions / Restrictions Precautions Precautions: None Restrictions Weight Bearing Restrictions: No      Mobility  Bed Mobility Overal bed mobility: Independent                Transfers Overall transfer level: Independent Equipment used: None                Ambulation/Gait Ambulation/Gait assistance: Independent Gait Distance (Feet): 200 Feet Assistive device: None Gait Pattern/deviations: WFL(Within Functional Limits) Gait velocity: decreased but patient states she is at her baseline;   General Gait Details: does exhibit slightly slower gait speed, but reciprocal gait pattern with good foot  clearance; no sway and good balance/stability noted;  Stairs            Wheelchair Mobility    Modified Rankin (Stroke Patients Only)       Balance Overall balance assessment: Independent                                           Pertinent Vitals/Pain Pain Assessment: No/denies pain    Home Living Family/patient expects to be discharged to:: Private residence Living Arrangements: Spouse/significant other Available Help at Discharge: Family;Available 24 hours/day Type of Home: House Home Access: Stairs to enter Entrance Stairs-Rails: None Entrance Stairs-Number of Steps: 2-3 Home Layout: One level Home Equipment: None      Prior Function Level of Independence: Independent         Comments: pt still driving; 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; Exercises daily;     Hand Dominance   Dominant Hand: Right    Extremity/Trunk Assessment   Upper Extremity Assessment Upper Extremity Assessment: Overall WFL for tasks assessed    Lower Extremity Assessment Lower Extremity Assessment: Overall WFL for tasks assessed    Cervical / Trunk Assessment Cervical / Trunk Assessment: Normal  Communication   Communication: No difficulties  Cognition Arousal/Alertness: Awake/alert Behavior During Therapy: WFL for tasks assessed/performed Overall Cognitive Status: Within Functional Limits for tasks assessed  General Comments General comments (skin integrity, edema, etc.): no edema noted;    Exercises     Assessment/Plan    PT Assessment Patent does not need any further PT services  PT Problem List         PT Treatment Interventions      PT Goals (Current goals can be found in the Care Plan section)  Acute Rehab PT Goals Patient Stated Goal: to go home PT Goal Formulation: With patient Time For Goal Achievement: 10/01/19 Potential to Achieve Goals: Good     Frequency     Barriers to discharge        Co-evaluation               AM-PAC PT "6 Clicks" Mobility  Outcome Measure Help needed turning from your back to your side while in a flat bed without using bedrails?: None Help needed moving from lying on your back to sitting on the side of a flat bed without using bedrails?: None Help needed moving to and from a bed to a chair (including a wheelchair)?: None Help needed standing up from a chair using your arms (e.g., wheelchair or bedside chair)?: None Help needed to walk in hospital room?: None Help needed climbing 3-5 steps with a railing? : None 6 Click Score: 24    End of Session Equipment Utilized During Treatment: Gait belt Activity Tolerance: Patient tolerated treatment well Patient left: in chair;with call bell/phone within reach;with chair alarm set Nurse Communication: Mobility status      Time: 1610-9604 PT Time Calculation (min) (ACUTE ONLY): 27 min   Charges:   PT Evaluation $PT Eval Low Complexity: 1 Low           Chancey Ringel PT, DPT 10/01/2019, 12:54 PM

## 2019-10-01 NOTE — Progress Notes (Signed)
Kristy Sullivan   DOB:12/24/1945   [id number]    Subjective: Patient walking in the hallways.  Denies any focal weakness.  Denies any headaches.  Denies any nausea vomiting.   Objective:  Vitals:   10/01/19 0444 10/01/19 0750  BP: (!) 154/108 (!) 165/104  Pulse: (!) 37 59  Resp: 17 17  Temp: 98.1 F (36.7 C) 98 F (36.7 C)  SpO2: 98% 100%     Intake/Output Summary (Last 24 hours) at 10/01/2019 1212 Last data filed at 10/01/2019 1012 Gross per 24 hour  Intake 518.98 ml  Output --  Net 518.98 ml    Physical Exam HENT:     Head: Normocephalic and atraumatic.     Mouth/Throat:     Pharynx: No oropharyngeal exudate.  Eyes:     Pupils: Pupils are equal, round, and reactive to light.  Cardiovascular:     Rate and Rhythm: Normal rate.     Comments: Irregular rhythm. Pulmonary:     Effort: Pulmonary effort is normal. No respiratory distress.     Breath sounds: Normal breath sounds. No wheezing.  Abdominal:     General: Bowel sounds are normal. There is no distension.     Palpations: Abdomen is soft. There is no mass.     Tenderness: There is no abdominal tenderness. There is no guarding or rebound.  Musculoskeletal:        General: No tenderness. Normal range of motion.     Cervical back: Normal range of motion and neck supple.  Skin:    General: Skin is warm.  Neurological:     Mental Status: She is alert and oriented to person, place, and time.  Psychiatric:        Mood and Affect: Affect normal.      Labs:  Lab Results  Component Value Date   WBC 8.3 10/01/2019   HGB 12.8 10/01/2019   HCT 39.4 10/01/2019   MCV 92.7 10/01/2019   PLT 954 (HH) 10/01/2019   NEUTROABS 6.7 09/30/2019    Lab Results  Component Value Date   NA 140 10/01/2019   K 3.6 10/01/2019   CL 107 10/01/2019   CO2 23 10/01/2019    Studies:  CT Head Wo Contrast  Result Date: 09/30/2019 CLINICAL DATA:  TIA.  Syncopal episode today. EXAM: CT HEAD WITHOUT CONTRAST TECHNIQUE: Contiguous  axial images were obtained from the base of the skull through the vertex without intravenous contrast. COMPARISON:  None. FINDINGS: Brain: Mild generalized white matter hypoattenuation is likely within normal limits for age. No acute infarct, hemorrhage, or mass lesion is present. The ventricles are of normal size. No significant extraaxial fluid collection is present. The brainstem and cerebellum are within normal limits. Vascular: No hyperdense vessel or unexpected calcification. Skull: Calvarium is intact. No focal lytic or blastic lesions are present. No significant extracranial soft tissue lesion is present. Sinuses/Orbits: The paranasal sinuses and mastoid air cells are clear. The globes and orbits are within normal limits. IMPRESSION: Normal CT appearance of the brain for age. Electronically Signed   By: San Morelle M.D.   On: 09/30/2019 09:11   MR BRAIN WO CONTRAST  Result Date: 09/30/2019 CLINICAL DATA:  Left-sided weakness and near syncope. EXAM: MRI HEAD WITHOUT CONTRAST TECHNIQUE: Multiplanar, multiecho pulse sequences of the brain and surrounding structures were obtained without intravenous contrast. COMPARISON:  Head CT same day FINDINGS: Brain: Diffusion imaging shows several punctate foci acute infarction in the right parietal lobe affecting the cortex and underlying white  matter consistent with micro embolic infarctions in a right MCA branch vessel territory. No confluent or large vessel territory infarction. No mass lesion, hemorrhage, hydrocephalus or extra-axial collection. Background pattern of moderate chronic small-vessel ischemic changes throughout both cerebral hemispheres. Vascular: Major vessels at the base of the brain show flow. Skull and upper cervical spine: Negative Sinuses/Orbits: Clear/normal Other: Right mastoid effusion. IMPRESSION: Several punctate acute infarctions in the right parietal lobe affecting the cortical and subcortical brain consistent with micro embolic  infarctions in a right MCA branch vessel distribution. No confluent infarction. No swelling or hemorrhage. Moderate chronic small-vessel ischemic changes elsewhere throughout the cerebral hemispheric white matter. Electronically Signed   By: Nelson Chimes M.D.   On: 09/30/2019 16:02   ECHOCARDIOGRAM COMPLETE  Result Date: 09/30/2019    ECHOCARDIOGRAM REPORT   Patient Name:   TAWNY RASPBERRY Date of Exam: 09/30/2019 Medical Rec #:  [id number]        Height:       63.0 in Accession #:    [id number]       Weight:       167.0 lb Date of Birth:  June 17, 1945        BSA:          1.791 m Patient Age:    74 years         BP:           102/81 mmHg Patient Gender: F                HR:           91 bpm. Exam Location:  [facility] Procedure: 2D Echo, Color Doppler and Cardiac Doppler Indications:     Atrial fibrillation 427.31  History:         Patient has no prior history of Echocardiogram examinations. No                  medical history on file.  Sonographer:     Sherrie Sport RDCS (AE) Referring Phys:  [id number] [place] Diagnosing Phys: Kathlyn Sacramento MD  Sonographer Comments: Suboptimal apical window. IMPRESSIONS  1. Left ventricular ejection fraction, by estimation, is 50 to 55%. The left ventricle has low normal function. The left ventricle has no regional wall motion abnormalities. There is mild left ventricular hypertrophy. Left ventricular diastolic parameters are indeterminate.  2. Right ventricular systolic function is normal. The right ventricular size is normal. There is mildly elevated pulmonary artery systolic pressure.  3. Left atrial size was mildly dilated.  4. Right atrial size was moderately dilated.  5. The mitral valve is normal in structure. Mild to moderate mitral valve regurgitation. No evidence of mitral stenosis.  6. The aortic valve is normal in structure. Aortic valve regurgitation is not visualized. No aortic stenosis is present. FINDINGS  Left Ventricle: Left ventricular ejection fraction, by  estimation, is 50 to 55%. The left ventricle has low normal function. The left ventricle has no regional wall motion abnormalities. The left ventricular internal cavity size was normal in size. There is mild left ventricular hypertrophy. Left ventricular diastolic parameters are indeterminate. Right Ventricle: The right ventricular size is normal. No increase in right ventricular wall thickness. Right ventricular systolic function is normal. There is mildly elevated pulmonary artery systolic pressure. The tricuspid regurgitant velocity is 2.86  m/s, and with an assumed right atrial pressure of 10 mmHg, the estimated right ventricular systolic pressure is 96.7 mmHg. Left Atrium: Left atrial size was mildly dilated. Right  Atrium: Right atrial size was moderately dilated. Pericardium: Trivial pericardial effusion is present. The pericardial effusion is posterior to the left ventricle. Mitral Valve: The mitral valve is normal in structure. Normal mobility of the mitral valve leaflets. Mild to moderate mitral valve regurgitation. No evidence of mitral valve stenosis. Tricuspid Valve: The tricuspid valve is normal in structure. Tricuspid valve regurgitation is trivial. No evidence of tricuspid stenosis. Aortic Valve: The aortic valve is normal in structure. Aortic valve regurgitation is not visualized. No aortic stenosis is present. Aortic valve mean gradient measures 2.0 mmHg. Aortic valve peak gradient measures 3.9 mmHg. Aortic valve area, by VTI measures 2.60 cm. Pulmonic Valve: The pulmonic valve was normal in structure. Pulmonic valve regurgitation is mild. No evidence of pulmonic stenosis. Aorta: The aortic root is normal in size and structure. Venous: The inferior vena cava was not well visualized. IAS/Shunts: No atrial level shunt detected by color flow Doppler.  LEFT VENTRICLE PLAX 2D LVIDd:         3.96 cm LVIDs:         2.87 cm LV PW:         1.11 cm LV IVS:        1.18 cm LVOT diam:     2.00 cm LV SV:          45 LV SV Index:   25 LVOT Area:     3.14 cm  RIGHT VENTRICLE RV Basal diam:  4.48 cm RV S prime:     8.38 cm/s TAPSE (M-mode): 3.7 cm LEFT ATRIUM           Index       RIGHT ATRIUM           Index LA diam:      4.50 cm 2.51 cm/m  RA Area:     48.80 cm LA Vol (A2C): 36.2 ml 20.21 ml/m RA Volume:   226.00 ml 126.19 ml/m LA Vol (A4C): 75.1 ml 41.93 ml/m  AORTIC VALVE                   PULMONIC VALVE AV Area (Vmax):    2.70 cm    PV Vmax:        0.61 m/s AV Area (Vmean):   2.48 cm    PV Peak grad:   1.5 mmHg AV Area (VTI):     2.60 cm    RVOT Peak grad: 2 mmHg AV Vmax:           98.15 cm/s AV Vmean:          69.800 cm/s AV VTI:            0.174 m AV Peak Grad:      3.9 mmHg AV Mean Grad:      2.0 mmHg LVOT Vmax:         84.20 cm/s LVOT Vmean:        55.000 cm/s LVOT VTI:          0.144 m LVOT/AV VTI ratio: 0.83  AORTA Ao Root diam: 2.80 cm MITRAL VALVE                TRICUSPID VALVE MV Area (PHT): 4.80 cm     TR Peak grad:   32.7 mmHg MV Decel Time: 158 msec     TR Vmax:        286.00 cm/s MV E velocity: 108.00 cm/s  SHUNTS                             Systemic VTI:  0.14 m                             Systemic Diam: 2.00 cm Kathlyn Sacramento MD Electronically signed by Kathlyn Sacramento MD Signature Date/Time: 09/30/2019/2:32:00 PM    Final     Thrombocytosis [facility]) #74 year old female patient with no significant past medical history is currently admitted to hospital for TIA; noted to have A. 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  Awaiting on JAK2; reflex.  Continue Hydrea 500 milligrams twice a day platelets slightly improved 954 today.  #A. fib with RVR-rate currently controlled; on Eliquis- follow cardiology.  #Stroke noted on MRI no residual deficits- on anticoagulation; consider adding Asprin 81 mg/day;  noted at this time-management as per  neurology/primary service.   Cammie Sickle, MD 10/01/2019  12:12 PM

## 2019-10-02 ENCOUNTER — Telehealth: Payer: Self-pay | Admitting: Internal Medicine

## 2019-10-02 DIAGNOSIS — D473 Essential (hemorrhagic) thrombocythemia: Secondary | ICD-10-CM

## 2019-10-02 DIAGNOSIS — D75839 Thrombocytosis, unspecified: Secondary | ICD-10-CM

## 2019-10-02 NOTE — Telephone Encounter (Signed)
Schedule Hospital follow up- 2:00 at 8/09-MD;lalbs- cbc/cmp- Dx; Essential thrombocytosis.

## 2019-10-03 LAB — JAK2 V617F, W REFLEX TO CALR/E12/MPL

## 2019-10-03 NOTE — Addendum Note (Signed)
Addended by: Gloris Ham on: 10/03/2019 09:26 AM   Modules accepted: Orders

## 2019-10-10 ENCOUNTER — Inpatient Hospital Stay: Payer: Medicare Other

## 2019-10-10 ENCOUNTER — Other Ambulatory Visit: Payer: Self-pay

## 2019-10-10 ENCOUNTER — Inpatient Hospital Stay: Payer: Medicare Other | Attending: Internal Medicine | Admitting: Internal Medicine

## 2019-10-10 ENCOUNTER — Telehealth: Payer: Self-pay | Admitting: Pharmacy Technician

## 2019-10-10 ENCOUNTER — Encounter: Payer: Self-pay | Admitting: Internal Medicine

## 2019-10-10 DIAGNOSIS — Z8673 Personal history of transient ischemic attack (TIA), and cerebral infarction without residual deficits: Secondary | ICD-10-CM | POA: Insufficient documentation

## 2019-10-10 DIAGNOSIS — I4891 Unspecified atrial fibrillation: Secondary | ICD-10-CM | POA: Diagnosis not present

## 2019-10-10 DIAGNOSIS — Z7982 Long term (current) use of aspirin: Secondary | ICD-10-CM | POA: Insufficient documentation

## 2019-10-10 DIAGNOSIS — D473 Essential (hemorrhagic) thrombocythemia: Secondary | ICD-10-CM | POA: Diagnosis not present

## 2019-10-10 DIAGNOSIS — Z7901 Long term (current) use of anticoagulants: Secondary | ICD-10-CM | POA: Diagnosis not present

## 2019-10-10 DIAGNOSIS — D75839 Thrombocytosis, unspecified: Secondary | ICD-10-CM

## 2019-10-10 LAB — CBC WITH DIFFERENTIAL/PLATELET
Abs Immature Granulocytes: 0.03 10*3/uL (ref 0.00–0.07)
Basophils Absolute: 0 10*3/uL (ref 0.0–0.1)
Basophils Relative: 0 %
Eosinophils Absolute: 0.1 10*3/uL (ref 0.0–0.5)
Eosinophils Relative: 1 %
HCT: 41.2 % (ref 36.0–46.0)
Hemoglobin: 13.7 g/dL (ref 12.0–15.0)
Immature Granulocytes: 0 %
Lymphocytes Relative: 15 %
Lymphs Abs: 1.4 10*3/uL (ref 0.7–4.0)
MCH: 30 pg (ref 26.0–34.0)
MCHC: 33.3 g/dL (ref 30.0–36.0)
MCV: 90.4 fL (ref 80.0–100.0)
Monocytes Absolute: 0.8 10*3/uL (ref 0.1–1.0)
Monocytes Relative: 9 %
Neutro Abs: 7.2 10*3/uL (ref 1.7–7.7)
Neutrophils Relative %: 75 %
Platelets: 1126 10*3/uL (ref 150–400)
RBC: 4.56 MIL/uL (ref 3.87–5.11)
RDW: 15.9 % — ABNORMAL HIGH (ref 11.5–15.5)
WBC: 9.6 10*3/uL (ref 4.0–10.5)
nRBC: 0 % (ref 0.0–0.2)

## 2019-10-10 LAB — COMPREHENSIVE METABOLIC PANEL
ALT: 29 U/L (ref 0–44)
AST: 31 U/L (ref 15–41)
Albumin: 4.3 g/dL (ref 3.5–5.0)
Alkaline Phosphatase: 81 U/L (ref 38–126)
Anion gap: 11 (ref 5–15)
BUN: 16 mg/dL (ref 8–23)
CO2: 26 mmol/L (ref 22–32)
Calcium: 9.6 mg/dL (ref 8.9–10.3)
Chloride: 104 mmol/L (ref 98–111)
Creatinine, Ser: 0.91 mg/dL (ref 0.44–1.00)
GFR calc Af Amer: >60 mL/min (ref >60)
GFR calc non Af Amer: >60 mL/min (ref >60)
Glucose, Bld: 105 mg/dL — ABNORMAL HIGH (ref 70–99)
Potassium: 3.9 mmol/L (ref 3.5–5.1)
Sodium: 141 mmol/L (ref 135–145)
Total Bilirubin: 2 mg/dL — ABNORMAL HIGH (ref 0.3–1.2)
Total Protein: 7.2 g/dL (ref 6.5–8.1)

## 2019-10-10 MED ORDER — HYDROXYUREA 500 MG PO CAPS: 1000.0000 mg | ORAL_CAPSULE | Freq: Two times a day (BID) | ORAL | 0 refills | Status: AC

## 2019-10-10 NOTE — Assessment & Plan Note (Addendum)
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  New prescription sent.  #A. fib with RVR-rate currently controlled; NOT on Eliquis because of cost/insurance.  Discussed with pharmacy social work; given a voucher for 30 days; can also apply for assistance.  Recommend follow up with cardiology.  Also recommend starting a baby aspirin.  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 fib -see above recommend close follow up with neurology  I spoke at length with the patient/husband regarding the patient's clinical status/plan of care.  Family agreement.    # DISPOSITION: # follow up in 3 weeks MD; labs- cbc/cmp;[facility]-Dr.B

## 2019-10-10 NOTE — Progress Notes (Signed)
214 pm plt count critical value 1126- called by Maudie Mercury R in lab.  Read back process performed. Dr. Rogue Bussing informed. 215pm read back process performed with MD  Patient did not pick up Eliquis due to high copay of $500. She has not started on any ASA 81 mg. Dr. B informed.

## 2019-10-10 NOTE — Telephone Encounter (Signed)
Oral Oncology Patient Advocate Encounter ° °Met patient in lobby to complete application for [employer] in an effort to reduce patient's out of pocket expense for Eliquis to $0.   ° °Application completed and faxed to [phone]  ° °[employer] patient assistance phone number for follow up is [phone].  ° °This encounter will be updated until final determination. ° °Judy Mcadams CPHT °Specialty Pharmacy Patient Advocate °[facility] °Phone [phone] °Fax [phone] °10/17/2019 2:21 PM ° °

## 2019-10-10 NOTE — Progress Notes (Signed)
[facility] CONSULT NOTE  Patient Care Team: Patient, No Pcp Per as PCP - General ([place]) Wellington Hampshire, MD as PCP - Cardiology (Cardiology)  CHIEF COMPLAINTS/PURPOSE OF CONSULTATION:  Essential thrombocytosis  #  Oncology History Overview Note   # July 2021-HIGH RISK Essential thrombocytosis-JAK-2 positive- / A.fib- TIA- currently on hydrea ; AUG 9th 1000 mg BID.   #A. fib with RVR-July 2021; August 9-Eliquis/baby aspirin   Essential thrombocythemia ([place])  09/30/2019 Initial Diagnosis   Essential thrombocythemia ([place])      HISTORY OF PRESENTING ILLNESS:  Kristy Sullivan 74 y.o.  female diagnosed A. Fib/thrombocytosis was recently admitted to hospital for TIA/strokes.  MRI showed concern for embolic stroke.  Incidentally patient noted to have elevated platelets of 1 million; start on Hydrea 500 mg twice a day.  Patient was evaluated by cardiology/neurology-recommend starting Eliquis outpatient at discharge; along with baby aspirin.  Patient unfortunately did not start Eliquis because of high cost.  No nausea no vomiting.  No headaches.  No blood in stools or black or stools.  Denies any worsening weakness.  Review of Systems  Constitutional: Negative for chills, diaphoresis, fever, malaise/fatigue and weight loss.  HENT: Negative for nosebleeds and sore throat.   Eyes: Negative for double vision.  Respiratory: Negative for cough, hemoptysis, sputum production, shortness of breath and wheezing.   Cardiovascular: Negative for chest pain, palpitations, orthopnea and leg swelling.  Gastrointestinal: Negative for abdominal pain, blood in stool, constipation, diarrhea, heartburn, melena, nausea and vomiting.  Genitourinary: Negative for dysuria, frequency and urgency.  Musculoskeletal: Positive for back pain and joint pain.  Skin: Negative.  Negative for itching and rash.  Neurological: Negative for dizziness, tingling, focal weakness, weakness and  headaches.  Endo/Heme/Allergies: Does not bruise/bleed easily.  Psychiatric/Behavioral: Negative for depression. The patient is not nervous/anxious and does not have insomnia.      MEDICAL HISTORY:  Past Medical History:  Diagnosis Date  . A-fib ([place])   . Hyperlipidemia   . Hypertension   . Thrombocytosis ([place])   . TIA (transient ischemic attack)     SURGICAL HISTORY: History reviewed. No pertinent surgical history.  SOCIAL HISTORY: Social History   Socioeconomic History  . Marital status: Married    Spouse name: Not on file  . Number of children: Not on file  . Years of education: Not on file  . Highest education level: Not on file  Occupational History  . Not on file  Tobacco Use  . Smoking status: Never Smoker  . Smokeless tobacco: Never Used  Substance and Sexual Activity  . Alcohol use: Never  . Drug use: Never  . Sexual activity: Never  Other Topics Concern  . 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    Social Determinants of Health   Financial Resource Strain:   . Difficulty of Paying Living Expenses:   Food Insecurity:   . Worried About Charity fundraiser in the Last Year:   . Arboriculturist in the Last Year:   Transportation Needs:   . Film/video editor (Medical):   Marland Kitchen Lack of Transportation (Non-Medical):   Physical Activity:   . Days of Exercise per Week:   . Minutes of Exercise per Session:   Stress:   . Feeling of Stress :   Social Connections:   . Frequency of Communication with Friends and Family:   .  Frequency of Social Gatherings with Friends and Family:   . Attends Religious Services:   . Active Member of Clubs or Organizations:   . Attends Archivist Meetings:   Marland Kitchen Marital Status:   Intimate Partner Violence:   . Fear of Current or Ex-Partner:   . Emotionally Abused:   Marland Kitchen Physically Abused:   . Sexually Abused:     FAMILY  HISTORY: Family History  Problem Relation Age of Onset  . High blood pressure Mother   . Hypertension Brother     ALLERGIES:  has No Known Allergies.  MEDICATIONS:  Current Outpatient Medications  Medication Sig Dispense Refill  . aspirin EC 81 MG tablet Take 81 mg by mouth daily. Swallow whole.    Marland Kitchen atorvastatin (LIPITOR) 40 MG tablet Take 1 tablet (40 mg total) by mouth daily. 30 tablet 0  . COD LIVER OIL PO Take 1 capsule by mouth daily.    . hydroxyurea (HYDREA) 500 MG capsule Take 2 capsules (1,000 mg total) by mouth 2 (two) times daily. May take with food to minimize GI side effects. 120 capsule 0  . metoprolol tartrate (LOPRESSOR) 25 MG tablet Take 0.5 tablets (12.5 mg total) by mouth 2 (two) times daily. 30 tablet 0  . Multiple Vitamin (MULTIVITAMIN) capsule Take 1 capsule by mouth daily.    Marland Kitchen apixaban (ELIQUIS) 5 MG TABS tablet Take 1 tablet (5 mg total) by mouth 2 (two) times daily. (Patient not taking: Reported on 10/10/2019) 60 tablet 0   No current facility-administered medications for this visit.      Marland Kitchen  PHYSICAL EXAMINATION: ECOG PERFORMANCE STATUS: 1 - Symptomatic but completely ambulatory  Vitals:   10/10/19 1356 10/10/19 1400  BP:  (!) 176/68  Pulse:  79  Resp: 18 20  Temp: (!) 97 F (36.1 C) 97.8 F (36.6 C)   Filed Weights   10/10/19 1356  Weight: 160 lb (72.6 kg)    Physical Exam Constitutional:      Comments: Ambulating independently.  Accompanied by husband.  HENT:     Head: Normocephalic and atraumatic.     Mouth/Throat:     Pharynx: No oropharyngeal exudate.  Eyes:     Pupils: Pupils are equal, round, and reactive to light.  Cardiovascular:     Rate and Rhythm: Normal rate. Rhythm irregular.  Pulmonary:     Effort: No respiratory distress.     Breath sounds: No wheezing.  Abdominal:     General: Bowel sounds are normal. There is no distension.     Palpations: Abdomen is soft. There is no mass.     Tenderness: There is no abdominal  tenderness. There is no guarding or rebound.  Musculoskeletal:        General: No tenderness. Normal range of motion.     Cervical back: Normal range of motion and neck supple.  Skin:    General: Skin is warm.  Neurological:     General: No focal deficit present.     Mental Status: She is alert and oriented to person, place, and time.  Psychiatric:        Mood and Affect: Affect normal.      LABORATORY DATA:  I have reviewed the data as listed Lab Results  Component Value Date   WBC 9.6 10/10/2019   HGB 13.7 10/10/2019   HCT 41.2 10/10/2019   MCV 90.4 10/10/2019   PLT PENDING 10/10/2019   Recent Labs    09/30/19 0922 10/01/19 0610 10/10/19 1344  NA 140 140 141  K 4.2 3.6 3.9  CL 106 107 104  CO2 26 23 26   GLUCOSE 117* 95 105*  BUN 17 15 16   CREATININE 0.87 0.84 0.91  CALCIUM 9.5 9.2 9.6  GFRNONAA >60 >60 >60  GFRAA >60 >60 >60  PROT 7.1  --  7.2  ALBUMIN 4.0  --  4.3  AST 28  --  31  ALT 24  --  29  ALKPHOS 80  --  81  BILITOT 1.4*  --  2.0*    RADIOGRAPHIC STUDIES: I have personally reviewed the radiological images as listed and agreed with the findings in the report. CT ANGIO HEAD W OR WO CONTRAST  Result Date: 10/01/2019 CLINICAL DATA:  Left-sided weakness and near syncope. Punctate acute infarctions in the right parietal lobe. EXAM: CT ANGIOGRAPHY HEAD AND NECK TECHNIQUE: Multidetector CT imaging of the head and neck was performed using the standard protocol during bolus administration of intravenous contrast. Multiplanar CT image reconstructions and MIPs were obtained to evaluate the vascular anatomy. Carotid stenosis measurements (when applicable) are obtained utilizing NASCET criteria, using the distal internal carotid diameter as the denominator. CONTRAST:  35mL OMNIPAQUE IOHEXOL 350 MG/ML SOLN COMPARISON:  CT and MRI done yesterday. FINDINGS: CT HEAD FINDINGS Brain: Continued normal appearance for age without evidence of atrophy, old or acute infarction,  mass lesion, hemorrhage, hydrocephalus or extra-axial collection. Tiny acute infarctions in the right parietal region shown by MRI are not appreciable by CT. Vascular: There is atherosclerotic calcification of the major vessels at the base of the brain. Skull: Negative Sinuses: Clear Orbits: Normal Review of the MIP images confirms the above findings CTA NECK FINDINGS Aortic arch: Aortic atherosclerosis. No aneurysm or dissection. Branching pattern is normal without origin stenosis. Right carotid system: Common carotid artery widely patent to the bifurcation. Carotid bifurcation is normal without soft or calcified plaque. Cervical ICA is widely patent. Left carotid system: Common carotid artery widely patent to the bifurcation. Minimal calcified plaque at the proximal ICA bulb but no stenosis. Cervical ICA widely patent beyond that. Vertebral arteries: Both vertebral artery origins widely patent. Both vertebral arteries normal through the cervical region to the foramen magnum. Skeleton: Ordinary cervical spondylosis. Other neck: No mass or adenopathy. Upper chest: Normal Review of the MIP images confirms the above findings CTA HEAD FINDINGS Anterior circulation: Both internal carotid arteries widely patent through the skull base and siphon regions. No stenosis. The anterior and middle cerebral vessels are patent without proximal stenosis, aneurysm or vascular malformation. No identifiable missing large or medium size vessel. Posterior circulation: Both vertebral arteries are patent to the basilar. No basilar stenosis. Posterior circulation branch vessels are normal. Venous sinuses: Patent and normal. Anatomic variants: None significant. Review of the MIP images confirms the above findings IMPRESSION: No large or medium vessel occlusion. Aortic atherosclerosis.  No brachiocephalic vessel origin stenosis. No stenosis at either carotid bifurcation. Small infarctions in the right parietal region shown by MRI consistent  with micro embolic infarctions. Electronically Signed   By: Nelson Chimes M.D.   On: 10/01/2019 13:41   CT Head Wo Contrast  Result Date: 09/30/2019 CLINICAL DATA:  TIA.  Syncopal episode today. EXAM: CT HEAD WITHOUT CONTRAST TECHNIQUE: Contiguous axial images were obtained from the base of the skull through the vertex without intravenous contrast. COMPARISON:  None. FINDINGS: Brain: Mild generalized white matter hypoattenuation is likely within normal limits for age. No acute infarct, hemorrhage, or mass lesion is present. The ventricles are  of normal size. No significant extraaxial fluid collection is present. The brainstem and cerebellum are within normal limits. Vascular: No hyperdense vessel or unexpected calcification. Skull: Calvarium is intact. No focal lytic or blastic lesions are present. No significant extracranial soft tissue lesion is present. Sinuses/Orbits: The paranasal sinuses and mastoid air cells are clear. The globes and orbits are within normal limits. IMPRESSION: Normal CT appearance of the brain for age. Electronically Signed   By: San Morelle M.D.   On: 09/30/2019 09:11   CT ANGIO NECK W OR WO CONTRAST  Result Date: 10/01/2019 CLINICAL DATA:  Left-sided weakness and near syncope. Punctate acute infarctions in the right parietal lobe. EXAM: CT ANGIOGRAPHY HEAD AND NECK TECHNIQUE: Multidetector CT imaging of the head and neck was performed using the standard protocol during bolus administration of intravenous contrast. Multiplanar CT image reconstructions and MIPs were obtained to evaluate the vascular anatomy. Carotid stenosis measurements (when applicable) are obtained utilizing NASCET criteria, using the distal internal carotid diameter as the denominator. CONTRAST:  6mL OMNIPAQUE IOHEXOL 350 MG/ML SOLN COMPARISON:  CT and MRI done yesterday. FINDINGS: CT HEAD FINDINGS Brain: Continued normal appearance for age without evidence of atrophy, old or acute infarction, mass  lesion, hemorrhage, hydrocephalus or extra-axial collection. Tiny acute infarctions in the right parietal region shown by MRI are not appreciable by CT. Vascular: There is atherosclerotic calcification of the major vessels at the base of the brain. Skull: Negative Sinuses: Clear Orbits: Normal Review of the MIP images confirms the above findings CTA NECK FINDINGS Aortic arch: Aortic atherosclerosis. No aneurysm or dissection. Branching pattern is normal without origin stenosis. Right carotid system: Common carotid artery widely patent to the bifurcation. Carotid bifurcation is normal without soft or calcified plaque. Cervical ICA is widely patent. Left carotid system: Common carotid artery widely patent to the bifurcation. Minimal calcified plaque at the proximal ICA bulb but no stenosis. Cervical ICA widely patent beyond that. Vertebral arteries: Both vertebral artery origins widely patent. Both vertebral arteries normal through the cervical region to the foramen magnum. Skeleton: Ordinary cervical spondylosis. Other neck: No mass or adenopathy. Upper chest: Normal Review of the MIP images confirms the above findings CTA HEAD FINDINGS Anterior circulation: Both internal carotid arteries widely patent through the skull base and siphon regions. No stenosis. The anterior and middle cerebral vessels are patent without proximal stenosis, aneurysm or vascular malformation. No identifiable missing large or medium size vessel. Posterior circulation: Both vertebral arteries are patent to the basilar. No basilar stenosis. Posterior circulation branch vessels are normal. Venous sinuses: Patent and normal. Anatomic variants: None significant. Review of the MIP images confirms the above findings IMPRESSION: No large or medium vessel occlusion. Aortic atherosclerosis.  No brachiocephalic vessel origin stenosis. No stenosis at either carotid bifurcation. Small infarctions in the right parietal region shown by MRI consistent with  micro embolic infarctions. Electronically Signed   By: Nelson Chimes M.D.   On: 10/01/2019 13:41   MR BRAIN WO CONTRAST  Result Date: 09/30/2019 CLINICAL DATA:  Left-sided weakness and near syncope. EXAM: MRI HEAD WITHOUT CONTRAST TECHNIQUE: Multiplanar, multiecho pulse sequences of the brain and surrounding structures were obtained without intravenous contrast. 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 No confluent or large vessel territory infarction. No mass lesion, hemorrhage, hydrocephalus or extra-axial collection. Background  pattern of moderate chronic small-vessel ischemic changes throughout both cerebral hemispheres. Vascular: Major vessels at the base of the brain show flow. Skull and upper cervical spine: Negative Sinuses/Orbits: Clear/normal Other: Right mastoid effusion. 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 No confluent infarction. No swelling or hemorrhage. Moderate chronic small-vessel ischemic changes elsewhere throughout the cerebral hemispheric white matter. Electronically Signed   By: Nelson Chimes M.D.   On: 09/30/2019 16:02   ECHOCARDIOGRAM COMPLETE  Result Date: 09/30/2019    ECHOCARDIOGRAM REPORT   Patient Name:   AMYRI FRENZ Date of Exam: 09/30/2019 Medical Rec #:  [id number]        Height:       63.0 in Accession #:    [id number]       Weight:       167.0 lb Date of Birth:  [birth year]        BSA:          1.791 m Patient Age:    80 years         BP:           102/81 mmHg Patient Gender: F                HR:           91 bpm. Exam Location:  [facility] Procedure: 2D Echo, Color Doppler and Cardiac  Doppler Indications:     Atrial fibrillation 427.31  History:         Patient has no prior history of Echocardiogram examinations. No                  medical history on file.  Sonographer:     Sherrie Sport RDCS (AE) Referring Phys:  [id number] [place] Diagnosing Phys: Kathlyn Sacramento MD  Sonographer Comments: Suboptimal apical window. IMPRESSIONS  1. Left ventricular ejection fraction, by estimation, is 50 to 55%. The left ventricle has low normal function. The left ventricle has no regional wall motion abnormalities. There is mild left ventricular hypertrophy. Left ventricular diastolic parameters are indeterminate.  2. Right ventricular systolic function is normal. The right ventricular size is normal. There is mildly elevated pulmonary artery systolic pressure.  3. Left atrial size was mildly dilated.  4. Right atrial size was moderately dilated.  5. The mitral valve is normal in structure. Mild to moderate mitral valve regurgitation. No evidence of mitral stenosis.  6. The aortic valve is normal in structure. Aortic valve regurgitation is not visualized. No aortic stenosis is present. FINDINGS  Left Ventricle: Left ventricular ejection fraction, by estimation, is 50 to 55%. The left ventricle has low normal function. The left ventricle has no regional wall motion abnormalities. The left ventricular internal cavity size was normal in size. There is mild left ventricular hypertrophy. Left ventricular diastolic parameters are indeterminate. Right Ventricle: The right ventricular size is normal. No increase in right ventricular wall thickness. Right ventricular systolic function is normal. There is mildly elevated pulmonary artery systolic pressure. The tricuspid regurgitant velocity is 2.86  m/s, and with an assumed right atrial pressure of 10 mmHg, the estimated right ventricular systolic pressure is 40.9 mmHg. Left Atrium: Left atrial size was mildly dilated. Right Atrium: Right atrial size was  moderately dilated. Pericardium: Trivial pericardial effusion is present. The pericardial effusion is posterior to the left ventricle. Mitral Valve: The mitral valve is normal  in structure. Normal mobility of the mitral valve leaflets. Mild to moderate mitral valve regurgitation. No evidence of mitral valve stenosis. Tricuspid Valve: The tricuspid valve is normal in structure. Tricuspid valve regurgitation is trivial. No evidence of tricuspid stenosis. Aortic Valve: The aortic valve is normal in structure. Aortic valve regurgitation is not visualized. No aortic stenosis is present. Aortic valve mean gradient measures 2.0 mmHg. Aortic valve peak gradient measures 3.9 mmHg. Aortic valve area, by VTI measures 2.60 cm. Pulmonic Valve: The pulmonic valve was normal in structure. Pulmonic valve regurgitation is mild. No evidence of pulmonic stenosis. Aorta: The aortic root is normal in size and structure. Venous: The inferior vena cava was not well visualized. IAS/Shunts: No atrial level shunt detected by color flow Doppler.  LEFT VENTRICLE PLAX 2D LVIDd:         3.96 cm LVIDs:         2.87 cm LV PW:         1.11 cm LV IVS:        1.18 cm LVOT diam:     2.00 cm LV SV:         45 LV SV Index:   25 LVOT Area:     3.14 cm  RIGHT VENTRICLE RV Basal diam:  4.48 cm RV S prime:     8.38 cm/s TAPSE (M-mode): 3.7 cm LEFT ATRIUM           Index       RIGHT ATRIUM           Index LA diam:      4.50 cm 2.51 cm/m  RA Area:     48.80 cm LA Vol (A2C): 36.2 ml 20.21 ml/m RA Volume:   226.00 ml 126.19 ml/m LA Vol (A4C): 75.1 ml 41.93 ml/m  AORTIC VALVE                   PULMONIC VALVE AV Area (Vmax):    2.70 cm    PV Vmax:        0.61 m/s AV Area (Vmean):   2.48 cm    PV Peak grad:   1.5 mmHg AV Area (VTI):     2.60 cm    RVOT Peak grad: 2 mmHg AV Vmax:           98.15 cm/s AV Vmean:          69.800 cm/s AV VTI:            0.174 m AV Peak Grad:      3.9 mmHg AV Mean Grad:      2.0 mmHg LVOT Vmax:         84.20 cm/s LVOT Vmean:         55.000 cm/s LVOT VTI:          0.144 m LVOT/AV VTI ratio: 0.83  AORTA Ao Root diam: 2.80 cm MITRAL VALVE                TRICUSPID VALVE MV Area (PHT): 4.80 cm     TR Peak grad:   32.7 mmHg MV Decel Time: 158 msec     TR Vmax:        286.00 cm/s MV E velocity: 108.00 cm/s                             SHUNTS  Systemic VTI:  0.14 m                             Systemic Diam: 2.00 cm Kathlyn Sacramento MD Electronically signed by Kathlyn Sacramento MD Signature Date/Time: 09/30/2019/2:32:00 PM    Final     ASSESSMENT & PLAN:   Essential thrombocythemia ([place]) #HIGH RISK Essential thrombocytosis-JAK-2 positive- / A.fib- TIA- currently on hydrea 500 mg BID; Today- platelets- 1126; increase hydrea to 100 mg BID.  New prescription sent.  #A. fib with RVR-rate currently controlled; NOT on Eliquis because of cost/insurance.  Discussed with pharmacy social work; given a voucher for 30 days; can also apply for assistance.  Recommend follow up with cardiology.  Also recommend starting a baby aspirin.  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 fib -see above recommend close follow up with neurology  I spoke at length with the patient/husband regarding the patient's clinical status/plan of care.  Family agreement.    # DISPOSITION: # follow up in 3 weeks MD; labs- cbc/cmp;[facility]-Dr.B  All questions were answered. The patient knows to call the clinic with any problems, questions or concerns.     Cammie Sickle, MD 10/10/2019 2:43 PM

## 2019-10-10 NOTE — Patient Instructions (Signed)
#   Start baby asprin 81 mg/day # start eliquis as recommended # increase the dose of hydrea as recommended.

## 2019-10-20 ENCOUNTER — Other Ambulatory Visit: Payer: Self-pay

## 2019-10-20 ENCOUNTER — Ambulatory Visit: Payer: Medicare Other | Admitting: Nurse Practitioner

## 2019-10-20 ENCOUNTER — Encounter: Payer: Self-pay | Admitting: Nurse Practitioner

## 2019-10-20 VITALS — BP 124/82 | HR 105 | Ht 63.0 in | Wt 157.0 lb

## 2019-10-20 DIAGNOSIS — I10 Essential (primary) hypertension: Secondary | ICD-10-CM

## 2019-10-20 DIAGNOSIS — D473 Essential (hemorrhagic) thrombocythemia: Secondary | ICD-10-CM

## 2019-10-20 DIAGNOSIS — I4819 Other persistent atrial fibrillation: Secondary | ICD-10-CM

## 2019-10-20 DIAGNOSIS — I6310 Cerebral infarction due to embolism of unspecified precerebral artery: Secondary | ICD-10-CM | POA: Diagnosis not present

## 2019-10-20 MED ORDER — METOPROLOL TARTRATE 25 MG PO TABS: 25.0000 mg | ORAL_TABLET | Freq: Two times a day (BID) | ORAL | 6 refills | Status: DC

## 2019-10-20 NOTE — Progress Notes (Signed)
Office Visit    Patient Name: Kristy Sullivan Date of Encounter: 10/20/2019  Primary Care Provider:  Patient, No Pcp Per Primary Cardiologist:  Kathlyn Sacramento, MD  Chief Complaint    74 year old female who presents for follow-up after recent hospitalization for for embolic stroke with discovery of persistent atrial fibrillation and thrombocytosis.  Past Medical History    Past Medical History:  Diagnosis Date  . Embolic stroke ([place])    a. 09/2019 MRI: several punctate acute infarcts in R parietal lobe affecting the cortical and subcortical brain consistent w/ micro embolic infarcts in R MCA branch vessel distribution.  . Hyperlipidemia   . Hypertension   . Persistent atrial fibrillation ([place])    a. 09/2019 Dx in setting of ED visit for weakness-->embolic stroke; b. [id number] = 6-->eliquis; c. 09/2019 Echo: EF 50-55%, no rwma, mild LVH. Nl RV size/fxn. Mild LAE, mod RAE. Mild to mod MR.  . Thrombocytosis ([place])    History reviewed. No pertinent surgical history.  Allergies  No Known Allergies  History of Present Illness    74 year old female with the above past medical history.  Prior to recent hospitalization, she did not have any significant past medical history.  On July 30 however, she presented to the [facility] emergency department after experiencing lightheadedness with left-sided weakness.  Upon EMS arrival, she was found to be in atrial fibrillation with rates in the 90s.  In the emergency department, she was hypertensive.  Labs were notable for thrombocytosis with platelets of 1 million.  She continued to complain of mild left-sided weakness and underwent MRI which showed several punctate acute infarcts in the right parietal lobe affecting the cortical and subcortical brain consistent with microembolic infarcts in the right MCA branch vessel distribution.  She was admitted and placed on metoprolol and Eliquis.  Echo showed an EF of 50-55% with mild left and  moderate right atrial enlargement.  She was seen by hematology in the setting of thrombocytosis and placed on hydroxyurea.  Following discharge, she was not initially able to obtain Eliquis secondary to cost but was provided a 30-day card and has since been taking this as prescribed.  She has also applied for the patient assistance program and is awaiting to hear feedback.  Since discharge, she notes doing exceptionally well.  She continues to walk every day without symptoms or limitations.  She occasionally notes palpitations but denies chest pain, dyspnea, PND, orthopnea, dizziness, syncope, edema, or early satiety.  She has not had any significant weakness or alteration in mental status.  Home Medications    Prior to Admission medications   Medication Sig Start Date End Date Taking? Authorizing Provider  apixaban (ELIQUIS) 5 MG TABS tablet Take 1 tablet (5 mg total) by mouth 2 (two) times daily. Patient not taking: Reported on 10/10/2019 10/02/19 11/01/19  Wyvonnia Dusky, MD  aspirin EC 81 MG tablet Take 81 mg by mouth daily. Swallow whole.    [provider]  atorvastatin (LIPITOR) 40 MG tablet Take 1 tablet (40 mg total) by mouth daily. 10/02/19 11/01/19  Wyvonnia Dusky, MD  COD LIVER OIL PO Take 1 capsule by mouth daily.    [provider]  hydroxyurea (HYDREA) 500 MG capsule Take 2 capsules (1,000 mg total) by mouth 2 (two) times daily. May take with food to minimize GI side effects. 10/10/19 11/09/19  Cammie Sickle, MD  metoprolol tartrate (LOPRESSOR) 25 MG tablet Take 0.5 tablets (12.5 mg total) by mouth 2 (two)  times daily. 10/01/19 10/31/19  Wyvonnia Dusky, MD  Multiple Vitamin (MULTIVITAMIN) capsule Take 1 capsule by mouth daily.    [provider]    Review of Systems    Occasional palpitations but otherwise doing well without chest pain, dyspnea, PND, orthopnea, dizziness, syncope, edema, or early satiety.  All other systems reviewed and are  otherwise negative except as noted above.  Physical Exam    VS:  BP 124/82   Pulse (!) 105   Ht 5\' 3"  (1.6 m)   Wt 157 lb (71.2 kg)   LMP  (LMP Unknown)   SpO2 98%   BMI 27.81 kg/m  , BMI Body mass index is 27.81 kg/m. GEN: Well nourished, well developed, in no acute distress. HEENT: normal. Neck: Supple, no JVD, carotid bruits, or masses. Cardiac: IR, IR, no murmurs, rubs, or gallops. No clubbing, cyanosis, edema.  Radials/PT 2+ and equal bilaterally.  Respiratory:  Respirations regular and unlabored, clear to auscultation bilaterally. GI: Soft, nontender, nondistended, BS + x 4. MS: no deformity or atrophy. Skin: warm and dry, no rash. Neuro:  Strength and sensation are intact. Psych: Normal affect.  Accessory Clinical Findings    ECG personally reviewed by me today -atrial fibrillation, 105, left axis deviation- no acute changes.  Lab Results  Component Value Date   WBC 9.6 10/10/2019   HGB 13.7 10/10/2019   HCT 41.2 10/10/2019   MCV 90.4 10/10/2019   PLT 1,126 (HH) 10/10/2019   Lab Results  Component Value Date   CREATININE 0.91 10/10/2019   BUN 16 10/10/2019   NA 141 10/10/2019   K 3.9 10/10/2019   CL 104 10/10/2019   CO2 26 10/10/2019   Lab Results  Component Value Date   ALT 29 10/10/2019   AST 31 10/10/2019   ALKPHOS 81 10/10/2019   BILITOT 2.0 (H) 10/10/2019   Lab Results  Component Value Date   CHOL 223 (H) 10/01/2019   HDL 60 10/01/2019   LDLCALC 146 (H) 10/01/2019   TRIG 83 10/01/2019   CHOLHDL 3.7 10/01/2019    Lab Results  Component Value Date   HGBA1C 6.1 (H) 10/01/2019    Assessment & Plan    1.  Persistent atrial fibrillation: Recently diagnosed at ER visit in late July.  She had no known prior history of atrial fibrillation but did report a several month history of intermittent palpitations.  At the time of her presentation, she also had suffered a microembolic stroke.  She has no residual deficits related to that.  She is now on  Eliquis therapy and we discussed the importance of ongoing anticoagulation in the setting of elevated CHA2DS2-VASc and persistent atrial fibrillation.  Plan is for rate control given relative asymptomatic nature.  Rate is elevated to 105 and I am going to increase metoprolol to 25 mg twice daily.  She has applied for Eliquis assistance and is awaiting feedback.  If for some reason she cannot obtain Eliquis in the long run, we will need to transition to warfarin.  2.  Essential hypertension: Stable on beta-blocker therapy.  3.  Hyperlipidemia: LDL was 146 in July.  She is now on Lipitor.  She will need follow-up lipids and LFTs at next appointment.  4.  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  5.  Embolic stroke: In the setting of #1.  No deficits.  Continue Eliquis.    6.  Disposition: Follow-up in clinic in 2 months or sooner if necessary.  She will  contact us if she is unable to qualify for the Eliquis assistance program, at which time, we will need to get her routed into our Coumadin clinic.  Will need follow-up lipids and LFTs at that time.  Murray Hodgkins, NP 10/20/2019, 12:50 PM

## 2019-10-20 NOTE — Patient Instructions (Signed)
Medication Instructions:  1- INCREASE Metoprolol Take 1 tablet (25 mg total) by mouth 2 (two) times daily *If you need a refill on your cardiac medications before your next appointment, please call your pharmacy*   Lab Work: None ordered If you have labs (blood work) drawn today and your tests are completely normal, you will receive your results only by: Marland Kitchen MyChart Message (if you have MyChart) OR . A paper copy in the mail If you have any lab test that is abnormal or we need to change your treatment, we will call you to review the results.   Testing/Procedures: None ordered   Follow-Up: At [facility], you and your health needs are our priority.  As part of our continuing mission to provide you with exceptional heart care, we have created designated Provider Care Teams.  These Care Teams include your primary Cardiologist (physician) and Advanced Practice Providers (APPs -  Physician Assistants and Nurse Practitioners) who all work together to provide you with the care you need, when you need it.  We recommend signing up for the patient portal called "MyChart".  Sign up information is provided on this After Visit Summary.  MyChart is used to connect with patients for Virtual Visits (Telemedicine).  Patients are able to view lab/test results, encounter notes, upcoming appointments, etc.  Non-urgent messages can be sent to your provider as well.   To learn more about what you can do with MyChart, go to [email or web address].    Your next appointment:   4-6  week(s)  The format for your next appointment:   In Person  Provider:    You may see Kathlyn Sacramento, MD or Murray Hodgkins

## 2019-10-25 NOTE — Telephone Encounter (Signed)
Faxed insurance info to [employer] per their fax request.  [place] Nancy [place] Patient [place] Phone [phone] Fax [phone] 10/25/2019 4:09 PM

## 2019-10-31 ENCOUNTER — Encounter: Payer: Self-pay | Admitting: Internal Medicine

## 2019-10-31 ENCOUNTER — Inpatient Hospital Stay: Payer: Medicare Other | Admitting: Internal Medicine

## 2019-10-31 ENCOUNTER — Inpatient Hospital Stay: Payer: Medicare Other

## 2019-10-31 ENCOUNTER — Other Ambulatory Visit: Payer: Self-pay

## 2019-10-31 DIAGNOSIS — D473 Essential (hemorrhagic) thrombocythemia: Secondary | ICD-10-CM

## 2019-10-31 DIAGNOSIS — D75839 Thrombocytosis, unspecified: Secondary | ICD-10-CM

## 2019-10-31 LAB — CBC WITH DIFFERENTIAL/PLATELET
Abs Immature Granulocytes: 0 10*3/uL (ref 0.00–0.07)
Basophils Absolute: 0 10*3/uL (ref 0.0–0.1)
Basophils Relative: 1 %
Eosinophils Absolute: 0 10*3/uL (ref 0.0–0.5)
Eosinophils Relative: 1 %
HCT: 37.1 % (ref 36.0–46.0)
Hemoglobin: 12.6 g/dL (ref 12.0–15.0)
Immature Granulocytes: 0 %
Lymphocytes Relative: 32 %
Lymphs Abs: 1.2 10*3/uL (ref 0.7–4.0)
MCH: 31.9 pg (ref 26.0–34.0)
MCHC: 34 g/dL (ref 30.0–36.0)
MCV: 93.9 fL (ref 80.0–100.0)
Monocytes Absolute: 0.3 10*3/uL (ref 0.1–1.0)
Monocytes Relative: 7 %
Neutro Abs: 2.2 10*3/uL (ref 1.7–7.7)
Neutrophils Relative %: 59 %
Platelets: 416 10*3/uL — ABNORMAL HIGH (ref 150–400)
RBC: 3.95 MIL/uL (ref 3.87–5.11)
RDW: 19 % — ABNORMAL HIGH (ref 11.5–15.5)
WBC: 3.6 10*3/uL — ABNORMAL LOW (ref 4.0–10.5)
nRBC: 0 % (ref 0.0–0.2)

## 2019-10-31 LAB — COMPREHENSIVE METABOLIC PANEL
ALT: 30 U/L (ref 0–44)
AST: 22 U/L (ref 15–41)
Albumin: 4 g/dL (ref 3.5–5.0)
Alkaline Phosphatase: 70 U/L (ref 38–126)
Anion gap: 7 (ref 5–15)
BUN: 19 mg/dL (ref 8–23)
CO2: 27 mmol/L (ref 22–32)
Calcium: 9.1 mg/dL (ref 8.9–10.3)
Chloride: 105 mmol/L (ref 98–111)
Creatinine, Ser: 0.85 mg/dL (ref 0.44–1.00)
GFR calc Af Amer: >60 mL/min (ref >60)
GFR calc non Af Amer: >60 mL/min (ref >60)
Glucose, Bld: 119 mg/dL — ABNORMAL HIGH (ref 70–99)
Potassium: 4.2 mmol/L (ref 3.5–5.1)
Sodium: 139 mmol/L (ref 135–145)
Total Bilirubin: 2.3 mg/dL — ABNORMAL HIGH (ref 0.3–1.2)
Total Protein: 6.9 g/dL (ref 6.5–8.1)

## 2019-10-31 LAB — LACTATE DEHYDROGENASE: LDH: 153 U/L (ref 98–192)

## 2019-10-31 NOTE — Assessment & Plan Note (Addendum)
#  HIGH RISK Essential thrombocytosis-JAK-2 positive- / A.fib- TIA-; Today- platelets- 416; continue hydrea to 1000 mg BID.   #A. fib with RVR-rate currently controlled; on Eliquis; continue baby aspirin.  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 Fib- STABLE/see above.   I spoke at length with the patient/neice regarding the patient's clinical status/plan of care/prognosis.  Family agreement.   # DISPOSITION: # follow up in 4 weeks MD; labs- cbc/cmp;LDH-Dr.B

## 2019-10-31 NOTE — Progress Notes (Signed)
[place] CONSULT NOTE  Patient Care Team: Patient, No Pcp Per as PCP - General ([place]) Wellington Hampshire, MD as PCP - Cardiology (Cardiology)  CHIEF COMPLAINTS/PURPOSE OF CONSULTATION:  Essential thrombocytosis  #  Oncology History Overview Note   # July 2021-HIGH RISK Essential thrombocytosis-JAK-2 positive- / A.fib- TIA- currently on hydrea ; AUG 9th 1000 mg BID.   #A. fib with RVR-July 2021; August 9-Eliquis/baby aspirin   Essential thrombocythemia ([place])  09/30/2019 Initial Diagnosis   Essential thrombocythemia ([place])      HISTORY OF PRESENTING ILLNESS:  Kristy Sullivan 74 y.o.  female  On A. Fib/Essential Thrombocytosis- on hydrea/eliquis-asprin is here for follow up.  Since the last visit 3 weeks ago patient has been on Eliquis.   In the interim also been evaluated by cardiology.  Denies any blood in stools or black or stools.  Denies any worsening weakness.  No nausea no vomiting.  No diarrhea.  No skin rash.  Review of Systems  Constitutional: Negative for chills, diaphoresis, fever, malaise/fatigue and weight loss.  HENT: Negative for nosebleeds and sore throat.   Eyes: Negative for double vision.  Respiratory: Negative for cough, hemoptysis, sputum production, shortness of breath and wheezing.   Cardiovascular: Negative for chest pain, palpitations, orthopnea and leg swelling.  Gastrointestinal: Negative for abdominal pain, blood in stool, constipation, diarrhea, heartburn, melena, nausea and vomiting.  Genitourinary: Negative for dysuria, frequency and urgency.  Musculoskeletal: Positive for back pain and joint pain.  Skin: Negative.  Negative for itching and rash.  Neurological: Negative for dizziness, tingling, focal weakness, weakness and headaches.  Endo/Heme/Allergies: Does not bruise/bleed easily.  Psychiatric/Behavioral: Negative for depression. The patient is not nervous/anxious and does not have insomnia.      MEDICAL  HISTORY:  Past Medical History:  Diagnosis Date  . Embolic stroke ([place])    a. 09/2019 MRI: several punctate acute infarcts in R parietal lobe affecting the cortical and subcortical brain consistent w/ micro embolic infarcts in R MCA branch vessel distribution.  . Hyperlipidemia   . Hypertension   . Persistent atrial fibrillation ([place])    a. 09/2019 Dx in setting of ED visit for weakness-->embolic stroke; b. [id number] = 6-->eliquis; c. 09/2019 Echo: EF 50-55%, no rwma, mild LVH. Nl RV size/fxn. Mild LAE, mod RAE. Mild to mod MR.  . Thrombocytosis ([place])     SURGICAL HISTORY: No past surgical history on file.  SOCIAL HISTORY: Social History   Socioeconomic History  . Marital status: Married    Spouse name: Not on file  . Number of children: Not on file  . Years of education: Not on file  . Highest education level: Not on file  Occupational History  . Not on file  Tobacco Use  . Smoking status: Never Smoker  . Smokeless tobacco: Never Used  Substance and Sexual Activity  . Alcohol use: Never  . Drug use: Never  . Sexual activity: Never  Other Topics Concern  . 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    Social Determinants of Health   Financial Resource Strain:   . Difficulty of Paying Living Expenses: Not on file  Food Insecurity:   . Worried About Charity fundraiser in the Last Year: Not on file  . Ran Out of Food in the Last Year: Not on file  Transportation Needs:   . Lack of Transportation (Medical): Not on  file  . Lack of Transportation (Non-Medical): Not on file  Physical Activity:   . Days of Exercise per Week: Not on file  . Minutes of Exercise per Session: Not on file  Stress:   . Feeling of Stress : Not on file  Social Connections:   . Frequency of Communication with Friends and Family: Not on file  . Frequency of Social Gatherings with Friends and Family: Not on file  .  Attends Religious Services: Not on file  . Active Member of Clubs or Organizations: Not on file  . Attends Archivist Meetings: Not on file  . Marital Status: Not on file  Intimate Partner Violence:   . Fear of Current or Ex-Partner: Not on file  . Emotionally Abused: Not on file  . Physically Abused: Not on file  . Sexually Abused: Not on file    FAMILY HISTORY: Family History  Problem Relation Age of Onset  . High blood pressure Mother   . Hypertension Brother     ALLERGIES:  has No Known Allergies.  MEDICATIONS:  Current Outpatient Medications  Medication Sig Dispense Refill  . apixaban (ELIQUIS) 5 MG TABS tablet Take 1 tablet (5 mg total) by mouth 2 (two) times daily. 60 tablet 0  . aspirin EC 81 MG tablet Take 81 mg by mouth daily. Swallow whole.    Marland Kitchen atorvastatin (LIPITOR) 40 MG tablet Take 1 tablet (40 mg total) by mouth daily. 30 tablet 0  . COD LIVER OIL PO Take 1 capsule by mouth daily.    . hydroxyurea (HYDREA) 500 MG capsule Take 2 capsules (1,000 mg total) by mouth 2 (two) times daily. May take with food to minimize GI side effects. 120 capsule 0  . metoprolol tartrate (LOPRESSOR) 25 MG tablet Take 1 tablet (25 mg total) by mouth 2 (two) times daily. 60 tablet 6  . Multiple Vitamin (MULTIVITAMIN) capsule Take 1 capsule by mouth daily.     No current facility-administered medications for this visit.      Marland Kitchen  PHYSICAL EXAMINATION: ECOG PERFORMANCE STATUS: 1 - Symptomatic but completely ambulatory  Vitals:   10/31/19 0931  BP: 136/83  Pulse: 87  Resp: 16  Temp: 98.1 F (36.7 C)  SpO2: 100%   Filed Weights   10/31/19 0931  Weight: 158 lb (71.7 kg)    Physical Exam Constitutional:      Comments: Ambulating independently.  Accompanied by her niece.  HENT:     Head: Normocephalic and atraumatic.     Mouth/Throat:     Pharynx: No oropharyngeal exudate.  Eyes:     Pupils: Pupils are equal, round, and reactive to light.  Cardiovascular:      Rate and Rhythm: Normal rate. Rhythm irregular.  Pulmonary:     Effort: No respiratory distress.     Breath sounds: No wheezing.  Abdominal:     General: Bowel sounds are normal. There is no distension.     Palpations: Abdomen is soft. There is no mass.     Tenderness: There is no abdominal tenderness. There is no guarding or rebound.  Musculoskeletal:        General: No tenderness. Normal range of motion.     Cervical back: Normal range of motion and neck supple.  Skin:    General: Skin is warm.  Neurological:     General: No focal deficit present.     Mental Status: She is alert and oriented to person, place, and time.  Psychiatric:  Mood and Affect: Affect normal.      LABORATORY DATA:  I have reviewed the data as listed Lab Results  Component Value Date   WBC 3.6 (L) 10/31/2019   HGB 12.6 10/31/2019   HCT 37.1 10/31/2019   MCV 93.9 10/31/2019   PLT 416 (H) 10/31/2019   Recent Labs    09/30/19 0922 09/30/19 0922 10/01/19 0610 10/10/19 1344 10/31/19 0920  NA 140   < > 140 141 139  K 4.2   < > 3.6 3.9 4.2  CL 106   < > 107 104 105  CO2 26   < > 23 26 27   GLUCOSE 117*   < > 95 105* 119*  BUN 17   < > 15 16 19   CREATININE 0.87   < > 0.84 0.91 0.85  CALCIUM 9.5   < > 9.2 9.6 9.1  GFRNONAA >60   < > >60 >60 >60  GFRAA >60   < > >60 >60 >60  PROT 7.1  --   --  7.2 6.9  ALBUMIN 4.0  --   --  4.3 4.0  AST 28  --   --  31 22  ALT 24  --   --  29 30  ALKPHOS 80  --   --  81 70  BILITOT 1.4*  --   --  2.0* 2.3*   < > = values in this interval not displayed.    RADIOGRAPHIC STUDIES: I have personally reviewed the radiological images as listed and agreed with the findings in the report. No results found.  ASSESSMENT & PLAN:   Essential thrombocythemia ([place]) #HIGH RISK Essential thrombocytosis-JAK-2 positive- / A.fib- TIA-; Today- platelets- 416; continue hydrea to 1000 mg BID.   #A. fib with RVR-rate currently controlled; on Eliquis; continue baby  aspirin.  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 Fib- STABLE/see above.   I spoke at length with the patient/neice regarding the patient's clinical status/plan of care/prognosis.  Family agreement.   # DISPOSITION: # follow up in 4 weeks MD; labs- cbc/cmp;LDH-Dr.B  All questions were answered. The patient knows to call the clinic with any problems, questions or concerns.     Cammie Sickle, MD 10/31/2019 2:58 PM

## 2019-11-01 NOTE — Telephone Encounter (Signed)
Called to check the status of patients application.  Rep stated that they were missing the insurance card and I told her I had faxed that in.  She found the fax and is sending the application for processing.    I will check back in a couple days to check on the status of the application.  [place] Patient [place] Phone [phone] Fax [phone] 11/01/2019 3:28 PM

## 2019-11-09 ENCOUNTER — Telehealth: Payer: Self-pay | Admitting: Pharmacy Technician

## 2019-11-09 NOTE — Telephone Encounter (Signed)
Oral Oncology Patient Advocate Encounter  Received notification from [place] Patient [facility] that patient has been successfully enrolled into their program to receive Eliquis from the manufacturer at $0 out of pocket from 11/02/19-10/31/20.    I called and spoke with patient.  She knows we will have to re-apply.   Specialty Pharmacy that will dispense medication is [facility].  Patient knows to call the office with questions or concerns.   Oral Oncology Clinic will continue to follow.  [place] Patient [address] Phone [phone] Fax [phone] 11/09/2019 9:54 AM

## 2019-11-10 ENCOUNTER — Telehealth: Payer: Self-pay | Admitting: *Deleted

## 2019-11-10 NOTE — Telephone Encounter (Signed)
Received a request from [facility] re the need to RF eliquis. Confirmed with patient that she will receive her shipment for free medication from the foundation. She confirmed that she will receive a shipment on Monday.

## 2019-11-11 NOTE — Telephone Encounter (Signed)
Oral Oncology Patient Advocate Encounter  Dispensed samples to patient: 11/09/19  Medication: Eliquis 5mg  Instructions: Take 1 tablet by mouth 2 times daily Quantity dispensed: 14 Days supply: 7 Manufacturer: [employer] Lot: [id number] Exp: 03/2021 Rph Verified: [place] Patient [place] Phone [phone] Fax [phone]

## 2019-11-17 ENCOUNTER — Telehealth: Payer: Self-pay | Admitting: *Deleted

## 2019-11-17 NOTE — Telephone Encounter (Signed)
Spoke with patient's husband. Confirmed that patient received her eliquis shipment in the mail. Husband thanked me for following up.

## 2019-11-25 ENCOUNTER — Ambulatory Visit: Payer: Medicare Other | Admitting: Nurse Practitioner

## 2019-11-29 ENCOUNTER — Other Ambulatory Visit: Payer: Self-pay | Admitting: *Deleted

## 2019-11-29 MED ORDER — APIXABAN 5 MG PO TABS: 5.0000 mg | ORAL_TABLET | Freq: Two times a day (BID) | ORAL | 0 refills | Status: DC

## 2019-12-05 ENCOUNTER — Inpatient Hospital Stay: Payer: Medicare Other | Attending: Internal Medicine

## 2019-12-05 ENCOUNTER — Inpatient Hospital Stay: Payer: Medicare Other | Admitting: Internal Medicine

## 2019-12-30 ENCOUNTER — Other Ambulatory Visit: Payer: Self-pay | Admitting: *Deleted

## 2019-12-30 MED ORDER — APIXABAN 5 MG PO TABS: 5.0000 mg | ORAL_TABLET | Freq: Two times a day (BID) | ORAL | 0 refills | Status: DC

## 2020-01-23 ENCOUNTER — Telehealth: Payer: Self-pay | Admitting: *Deleted

## 2020-01-23 DIAGNOSIS — D473 Essential (hemorrhagic) thrombocythemia: Secondary | ICD-10-CM

## 2020-01-23 DIAGNOSIS — Z7901 Long term (current) use of anticoagulants: Secondary | ICD-10-CM

## 2020-01-23 DIAGNOSIS — I4819 Other persistent atrial fibrillation: Secondary | ICD-10-CM

## 2020-01-23 MED ORDER — APIXABAN 5 MG PO TABS: 5.0000 mg | ORAL_TABLET | Freq: Two times a day (BID) | ORAL | 6 refills | Status: DC

## 2020-01-23 NOTE — Telephone Encounter (Signed)
Incoming fax rf request for Eliquis - RF submitted to her pharmacy.

## 2020-07-07 ENCOUNTER — Encounter: Payer: Self-pay | Admitting: Emergency Medicine

## 2020-07-07 ENCOUNTER — Emergency Department
Admission: EM | Admit: 2020-07-07 | Discharge: 2020-07-07 | Disposition: A | Discharge status: Home / Self Care | Payer: Medicare Other | Attending: Emergency Medicine | Admitting: Emergency Medicine

## 2020-07-07 ENCOUNTER — Emergency Department: Payer: Medicare Other

## 2020-07-07 ENCOUNTER — Other Ambulatory Visit: Payer: Self-pay

## 2020-07-07 DIAGNOSIS — M25511 Pain in right shoulder: Secondary | ICD-10-CM | POA: Insufficient documentation

## 2020-07-07 DIAGNOSIS — I10 Essential (primary) hypertension: Secondary | ICD-10-CM | POA: Diagnosis not present

## 2020-07-07 DIAGNOSIS — Z79899 Other long term (current) drug therapy: Secondary | ICD-10-CM | POA: Diagnosis not present

## 2020-07-07 DIAGNOSIS — Z7982 Long term (current) use of aspirin: Secondary | ICD-10-CM | POA: Diagnosis not present

## 2020-07-07 DIAGNOSIS — Z7901 Long term (current) use of anticoagulants: Secondary | ICD-10-CM | POA: Insufficient documentation

## 2020-07-07 MED ORDER — LIDOCAINE 5 % EX PTCH
1.0000 | MEDICATED_PATCH | CUTANEOUS | Status: DC
  Administered 2020-07-07: 1 via TRANSDERMAL
  Filled 2020-07-07: qty 1

## 2020-07-07 MED ORDER — DICLOFENAC SODIUM 1 % EX GEL
4.0000 g | Freq: Four times a day (QID) | CUTANEOUS | 1 refills | Status: DC
Start: 2020-07-07 — End: 2023-01-19

## 2020-07-07 NOTE — ED Triage Notes (Signed)
Pt reports has no pain right now. Pt reports the pain comes and goes and she wants it looked at. Pt able to raise right arm but when she gets halfway she reports pain.

## 2020-07-07 NOTE — ED Triage Notes (Signed)
Pt reports pain to her right shoulder for the past 2 months. Pt reports pain gets worse when she tries to raise her right arm. Pt reports after work last pm the pain was worse. Denies all other sx's.

## 2020-07-07 NOTE — ED Provider Notes (Signed)
[facility] Emergency Department Provider Note   ____________________________________________   Event Date/Time   First MD Initiated Contact with Patient 07/07/20 1040     (approximate)  I have reviewed the triage vital signs and the nursing notes.   HISTORY  Chief Complaint Shoulder Pain    HPI Kristy Sullivan is a 75 y.o. female patient complain intermitting and and  right shoulder pain for the past few months.  Patient points to the [facility] joint as a source of pain.  Pain worse with adduction and overhead reaching.  Patient pain increased dramatically last night with overhead reaching.  Patient denies loss of sensation.  Patient provocative incident for complaint.  Patient currently states no pain but wants an x-ray to determine cause of complaint.         Past Medical History:  Diagnosis Date  . Embolic stroke ([place])    a. 09/2019 MRI: several punctate acute infarcts in R parietal lobe affecting the cortical and subcortical brain consistent w/ micro embolic infarcts in R MCA branch vessel distribution.  . Hyperlipidemia   . Hypertension   . Persistent atrial fibrillation ([place])    a. 09/2019 Dx in setting of ED visit for weakness-->embolic stroke; b. [id number] = 6-->eliquis; c. 09/2019 Echo: EF 50-55%, no rwma, mild LVH. Nl RV size/fxn. Mild LAE, mod RAE. Mild to mod MR.  . Thrombocytosis     Patient Active Problem List   Diagnosis Date Noted  . Essential thrombocythemia ([place]) 09/30/2019  . Elevated troponin 09/30/2019  . Stroke ([place]) 09/30/2019  . HTN (hypertension) 09/30/2019  . Atrial fibrillation ([place])     History reviewed. No pertinent surgical history.  Prior to Admission medications   Medication Sig Start Date End Date Taking? Authorizing Provider  diclofenac Sodium (VOLTAREN) 1 % GEL Apply 4 g topically 4 (four) times daily. 07/07/20  Yes Sable Feil, PA-C  apixaban (ELIQUIS) 5 MG TABS tablet Take 1 tablet (5 mg total) by mouth 2  (two) times daily. 01/23/20 02/22/20  Cammie Sickle, MD  aspirin EC 81 MG tablet Take 81 mg by mouth daily. Swallow whole.    [provider]  atorvastatin (LIPITOR) 40 MG tablet Take 1 tablet (40 mg total) by mouth daily. 10/02/19 11/01/19  Wyvonnia Dusky, MD  COD LIVER OIL PO Take 1 capsule by mouth daily.    [provider]  metoprolol tartrate (LOPRESSOR) 25 MG tablet Take 1 tablet (25 mg total) by mouth 2 (two) times daily. 10/20/19 11/19/19  Theora Gianotti, NP  Multiple Vitamin (MULTIVITAMIN) capsule Take 1 capsule by mouth daily.    [provider]    Allergies Patient has no known allergies.  Family History  Problem Relation Age of Onset  . High blood pressure Mother   . Hypertension Brother     Social History Social History   Tobacco Use  . Smoking status: Never Smoker  . Smokeless tobacco: Never Used  Substance Use Topics  . Alcohol use: Never  . Drug use: Never    Review of Systems  Constitutional: No fever/chills Eyes: No visual changes. ENT: No sore throat. Cardiovascular: Denies chest pain. Respiratory: Denies shortness of breath. Gastrointestinal: No abdominal pain.  No nausea, no vomiting.  No diarrhea.  No constipation. Genitourinary: Negative for dysuria. Musculoskeletal: Right shoulder pain. Skin: Negative for rash. Neurological: Negative for headaches, focal weakness or numbness. Endocrine:  Hyperlipidemia and hypertension. ____________________________________________   PHYSICAL EXAM:  VITAL SIGNS: ED Triage Vitals [07/07/20  1012]  Enc Vitals Group     BP (!) 151/90     Pulse Rate 66     Resp 20     Temp 97.7 F (36.5 C)     Temp Source Oral     SpO2 96 %     Weight 168 lb (76.2 kg)     Height 5\' 3"  (1.6 m)     Head Circumference      Peak Flow      Pain Score 0     Pain Loc      Pain Edu?      Excl. in [place]?     Constitutional: Alert and oriented. Well appearing and in no acute  distress. Neck: No stridor.  No cervical spine tenderness to palpation. Hematological/Lymphatic/Immunilogical: No cervical lymphadenopathy. Cardiovascular: Normal rate, regular rhythm. Grossly normal heart sounds.  Good peripheral circulation.  Elevated blood pressure. Respiratory: Normal respiratory effort.  No retractions. Lungs CTAB. Gastrointestinal: Soft and nontender. No distention. No abdominal bruits. No CVA tenderness. Musculoskeletal: No obvious deformity to the right shoulder.  Decreased range of motion with adduction overhead reaching.  Decreased strength against resistance in comparison to the left upper extremity. Neurologic:  Normal speech and language. No gross focal neurologic deficits are appreciated. No gait instability. Skin:  Skin is warm, dry and intact. No rash noted. Psychiatric: Mood and affect are normal. Speech and behavior are normal.  ____________________________________________   LABS (all labs ordered are listed, but only abnormal results are displayed)  Labs Reviewed - No data to display ____________________________________________  EKG   ____________________________________________  RADIOLOGY I, Sable Feil, personally viewed and evaluated these images (plain radiographs) as part of my medical decision making, as well as reviewing the written report by the radiologist.  ED MD interpretation: No acute findings x-ray of the right shoulder.  Moderate degenerative changes at the [facility] joint.  Official radiology report(s): DG Shoulder Right  Result Date: 07/07/2020 CLINICAL DATA:  Right shoulder pain and decreased range of motion for the past 2 months. EXAM: RIGHT SHOULDER - 2+ VIEW COMPARISON:  None. FINDINGS: Mild to moderate inferior glenohumeral spur formation. Moderate inferior acromial spur formation. No fracture or dislocation. IMPRESSION: Degenerative changes, as described above. Electronically Signed   By: Claudie Revering M.D.   On: 07/07/2020 11:11     ____________________________________________   PROCEDURES  Procedure(s) performed (including Critical Care):  Procedures   ____________________________________________   INITIAL IMPRESSION / ASSESSMENT AND PLAN / ED COURSE  As part of my medical decision making, I reviewed the following data within the [place]         Patient presents with 2 months of increasing right shoulder pain and decreased range of motion.  Discussed x-ray findings with patient.  Patient complaining physical exam consistent with adhesive capsulitis.  Patient given discharge care instruction and prescription Voltaren cream.  Patient advised to wear arm sling while awake until evaluation by orthopedics.      ____________________________________________   FINAL CLINICAL IMPRESSION(S) / ED DIAGNOSES  Final diagnoses:  Acute pain of right shoulder     ED Discharge Orders         Ordered    diclofenac Sodium (VOLTAREN) 1 % GEL  4 times daily        07/07/20 1140          *Please note:  Kristy Sullivan was evaluated in Emergency Department on 07/07/2020 for the symptoms described in the history of present illness. She  was evaluated in the context of the global COVID-19 pandemic, which necessitated consideration that the patient might be at risk for infection with the SARS-CoV-2 virus that causes COVID-19. Institutional protocols and algorithms that pertain to the evaluation of patients at risk for COVID-19 are in a state of rapid change based on information released by regulatory bodies including the CDC and federal and state organizations. These policies and algorithms were followed during the patient's care in the ED.  Some ED evaluations and interventions may be delayed as a result of limited staffing during and the pandemic.*   Note:  This document was prepared using Dragon voice recognition software and may include unintentional dictation errors.    Sable Feil,  PA-C 07/07/20 1149    Delman Kitten, MD 07/07/20 [phone]

## 2020-07-07 NOTE — Discharge Instructions (Signed)
Wear sling and take medications as directed.  Follow-up orthopedic for definitive evaluation and treatment.

## 2020-08-08 ENCOUNTER — Other Ambulatory Visit: Payer: Self-pay

## 2020-08-08 DIAGNOSIS — Z7901 Long term (current) use of anticoagulants: Secondary | ICD-10-CM

## 2020-08-08 DIAGNOSIS — I4819 Other persistent atrial fibrillation: Secondary | ICD-10-CM

## 2020-08-08 DIAGNOSIS — D473 Essential (hemorrhagic) thrombocythemia: Secondary | ICD-10-CM

## 2020-08-08 MED ORDER — APIXABAN 5 MG PO TABS: 5.0000 mg | ORAL_TABLET | Freq: Two times a day (BID) | ORAL | 2 refills | Status: DC

## 2020-08-08 NOTE — Telephone Encounter (Signed)
Refilled eliquis.   Pt needs follow up in last week of June 2022; MD; labs- cbc/cmp.Kristy Sullivan

## 2020-08-08 NOTE — Telephone Encounter (Signed)
Please advise on Eliquis refill.

## 2020-08-10 ENCOUNTER — Other Ambulatory Visit: Payer: Self-pay

## 2020-08-27 ENCOUNTER — Inpatient Hospital Stay: Payer: Medicare Other | Attending: Internal Medicine

## 2020-08-27 ENCOUNTER — Inpatient Hospital Stay: Payer: Medicare Other | Admitting: Internal Medicine

## 2023-01-19 ENCOUNTER — Emergency Department: Payer: Medicare PPO

## 2023-01-19 ENCOUNTER — Emergency Department
Admission: EM | Admit: 2023-01-19 | Discharge: 2023-01-19 | Disposition: A | Discharge status: Other Acute Inpatient Hospital | Payer: Medicare PPO | Attending: Emergency Medicine | Admitting: Emergency Medicine

## 2023-01-19 ENCOUNTER — Encounter: Payer: Self-pay | Admitting: Emergency Medicine

## 2023-01-19 ENCOUNTER — Inpatient Hospital Stay (HOSPITAL_COMMUNITY)
Admission: AD | Admit: 2023-01-19 | Discharge: 2023-01-27 | DRG: 299 | Disposition: A | Discharge status: Home Health | Payer: Medicare PPO | Source: Other Acute Inpatient Hospital | Attending: Family Medicine | Admitting: Family Medicine

## 2023-01-19 ENCOUNTER — Encounter (HOSPITAL_COMMUNITY): Payer: Self-pay

## 2023-01-19 ENCOUNTER — Other Ambulatory Visit: Payer: Self-pay

## 2023-01-19 ENCOUNTER — Encounter (HOSPITAL_COMMUNITY): Payer: Self-pay | Admitting: Critical Care Medicine

## 2023-01-19 DIAGNOSIS — R7303 Prediabetes: Secondary | ICD-10-CM | POA: Diagnosis present

## 2023-01-19 DIAGNOSIS — D75839 Thrombocytosis, unspecified: Secondary | ICD-10-CM | POA: Insufficient documentation

## 2023-01-19 DIAGNOSIS — R188 Other ascites: Secondary | ICD-10-CM | POA: Diagnosis present

## 2023-01-19 DIAGNOSIS — Z8673 Personal history of transient ischemic attack (TIA), and cerebral infarction without residual deficits: Secondary | ICD-10-CM

## 2023-01-19 DIAGNOSIS — I429 Cardiomyopathy, unspecified: Secondary | ICD-10-CM | POA: Diagnosis present

## 2023-01-19 DIAGNOSIS — R109 Unspecified abdominal pain: Secondary | ICD-10-CM | POA: Diagnosis present

## 2023-01-19 DIAGNOSIS — N280 Ischemia and infarction of kidney: Secondary | ICD-10-CM | POA: Insufficient documentation

## 2023-01-19 DIAGNOSIS — R079 Chest pain, unspecified: Secondary | ICD-10-CM | POA: Diagnosis not present

## 2023-01-19 DIAGNOSIS — E872 Acidosis, unspecified: Secondary | ICD-10-CM | POA: Diagnosis not present

## 2023-01-19 DIAGNOSIS — I5021 Acute systolic (congestive) heart failure: Secondary | ICD-10-CM | POA: Diagnosis not present

## 2023-01-19 DIAGNOSIS — Z91148 Patient's other noncompliance with medication regimen for other reason: Secondary | ICD-10-CM

## 2023-01-19 DIAGNOSIS — D696 Thrombocytopenia, unspecified: Secondary | ICD-10-CM | POA: Diagnosis not present

## 2023-01-19 DIAGNOSIS — R17 Unspecified jaundice: Secondary | ICD-10-CM | POA: Diagnosis not present

## 2023-01-19 DIAGNOSIS — I110 Hypertensive heart disease with heart failure: Secondary | ICD-10-CM | POA: Diagnosis present

## 2023-01-19 DIAGNOSIS — I71012 Dissection of descending thoracic aorta: Principal | ICD-10-CM

## 2023-01-19 DIAGNOSIS — I272 Pulmonary hypertension, unspecified: Secondary | ICD-10-CM | POA: Diagnosis present

## 2023-01-19 DIAGNOSIS — R600 Localized edema: Secondary | ICD-10-CM | POA: Insufficient documentation

## 2023-01-19 DIAGNOSIS — Z7189 Other specified counseling: Secondary | ICD-10-CM | POA: Diagnosis not present

## 2023-01-19 DIAGNOSIS — R531 Weakness: Secondary | ICD-10-CM | POA: Diagnosis not present

## 2023-01-19 DIAGNOSIS — I10 Essential (primary) hypertension: Secondary | ICD-10-CM | POA: Insufficient documentation

## 2023-01-19 DIAGNOSIS — J90 Pleural effusion, not elsewhere classified: Secondary | ICD-10-CM

## 2023-01-19 DIAGNOSIS — E785 Hyperlipidemia, unspecified: Secondary | ICD-10-CM | POA: Diagnosis present

## 2023-01-19 DIAGNOSIS — Z6824 Body mass index (BMI) 24.0-24.9, adult: Secondary | ICD-10-CM

## 2023-01-19 DIAGNOSIS — I4821 Permanent atrial fibrillation: Secondary | ICD-10-CM | POA: Diagnosis present

## 2023-01-19 DIAGNOSIS — I5020 Unspecified systolic (congestive) heart failure: Secondary | ICD-10-CM | POA: Diagnosis not present

## 2023-01-19 DIAGNOSIS — I7411 Embolism and thrombosis of thoracic aorta: Secondary | ICD-10-CM | POA: Diagnosis present

## 2023-01-19 DIAGNOSIS — Z8249 Family history of ischemic heart disease and other diseases of the circulatory system: Secondary | ICD-10-CM

## 2023-01-19 DIAGNOSIS — I161 Hypertensive emergency: Secondary | ICD-10-CM | POA: Diagnosis present

## 2023-01-19 DIAGNOSIS — J9601 Acute respiratory failure with hypoxia: Secondary | ICD-10-CM | POA: Diagnosis not present

## 2023-01-19 DIAGNOSIS — R7401 Elevation of levels of liver transaminase levels: Secondary | ICD-10-CM | POA: Diagnosis not present

## 2023-01-19 DIAGNOSIS — N179 Acute kidney failure, unspecified: Secondary | ICD-10-CM | POA: Diagnosis present

## 2023-01-19 DIAGNOSIS — R339 Retention of urine, unspecified: Secondary | ICD-10-CM | POA: Diagnosis not present

## 2023-01-19 DIAGNOSIS — M7989 Other specified soft tissue disorders: Secondary | ICD-10-CM | POA: Diagnosis not present

## 2023-01-19 DIAGNOSIS — Z7901 Long term (current) use of anticoagulants: Secondary | ICD-10-CM

## 2023-01-19 DIAGNOSIS — R54 Age-related physical debility: Secondary | ICD-10-CM | POA: Diagnosis present

## 2023-01-19 DIAGNOSIS — J918 Pleural effusion in other conditions classified elsewhere: Secondary | ICD-10-CM | POA: Diagnosis present

## 2023-01-19 DIAGNOSIS — R627 Adult failure to thrive: Secondary | ICD-10-CM | POA: Diagnosis present

## 2023-01-19 DIAGNOSIS — I959 Hypotension, unspecified: Secondary | ICD-10-CM | POA: Diagnosis not present

## 2023-01-19 DIAGNOSIS — D473 Essential (hemorrhagic) thrombocythemia: Principal | ICD-10-CM

## 2023-01-19 DIAGNOSIS — I5031 Acute diastolic (congestive) heart failure: Secondary | ICD-10-CM | POA: Diagnosis not present

## 2023-01-19 DIAGNOSIS — D649 Anemia, unspecified: Secondary | ICD-10-CM | POA: Diagnosis present

## 2023-01-19 DIAGNOSIS — I480 Paroxysmal atrial fibrillation: Secondary | ICD-10-CM | POA: Diagnosis not present

## 2023-01-19 DIAGNOSIS — D72829 Elevated white blood cell count, unspecified: Secondary | ICD-10-CM | POA: Diagnosis not present

## 2023-01-19 DIAGNOSIS — Z7982 Long term (current) use of aspirin: Secondary | ICD-10-CM

## 2023-01-19 DIAGNOSIS — J9811 Atelectasis: Secondary | ICD-10-CM | POA: Diagnosis not present

## 2023-01-19 DIAGNOSIS — I3139 Other pericardial effusion (noninflammatory): Secondary | ICD-10-CM | POA: Diagnosis present

## 2023-01-19 DIAGNOSIS — E440 Moderate protein-calorie malnutrition: Secondary | ICD-10-CM | POA: Diagnosis present

## 2023-01-19 DIAGNOSIS — I4891 Unspecified atrial fibrillation: Secondary | ICD-10-CM | POA: Diagnosis not present

## 2023-01-19 DIAGNOSIS — D72828 Other elevated white blood cell count: Secondary | ICD-10-CM | POA: Diagnosis present

## 2023-01-19 DIAGNOSIS — Z515 Encounter for palliative care: Secondary | ICD-10-CM | POA: Diagnosis not present

## 2023-01-19 DIAGNOSIS — R1084 Generalized abdominal pain: Secondary | ICD-10-CM

## 2023-01-19 DIAGNOSIS — Z79899 Other long term (current) drug therapy: Secondary | ICD-10-CM

## 2023-01-19 DIAGNOSIS — R195 Other fecal abnormalities: Secondary | ICD-10-CM | POA: Diagnosis not present

## 2023-01-19 HISTORY — DX: Essential (hemorrhagic) thrombocythemia: D47.3

## 2023-01-19 LAB — CBC
HCT: 40.7 % (ref 36.0–46.0)
HCT: 40.9 % (ref 36.0–46.0)
Hemoglobin: 13 g/dL (ref 12.0–15.0)
Hemoglobin: 13.1 g/dL (ref 12.0–15.0)
MCH: 31 pg (ref 26.0–34.0)
MCH: 31 pg (ref 26.0–34.0)
MCHC: 31.9 g/dL (ref 30.0–36.0)
MCHC: 32 g/dL (ref 30.0–36.0)
MCV: 96.9 fL (ref 80.0–100.0)
MCV: 96.9 fL (ref 80.0–100.0)
Platelets: 1296 10*3/uL (ref 150–400)
Platelets: 1315 10*3/uL (ref 150–400)
RBC: 4.2 MIL/uL (ref 3.87–5.11)
RBC: 4.22 MIL/uL (ref 3.87–5.11)
RDW: 16 % — ABNORMAL HIGH (ref 11.5–15.5)
RDW: 16.2 % — ABNORMAL HIGH (ref 11.5–15.5)
WBC: 50.3 10*3/uL (ref 4.0–10.5)
WBC: 50 10*3/uL — ABNORMAL HIGH (ref 4.0–10.5)
nRBC: 0.1 % (ref 0.0–0.2)
nRBC: 0.1 % (ref 0.0–0.2)

## 2023-01-19 LAB — URINALYSIS, W/ REFLEX TO CULTURE (INFECTION SUSPECTED)
Bacteria, UA: NONE SEEN
Bilirubin Urine: NEGATIVE
Glucose, UA: NEGATIVE mg/dL
Hgb urine dipstick: NEGATIVE
Ketones, ur: NEGATIVE mg/dL
Leukocytes,Ua: NEGATIVE
Nitrite: NEGATIVE
Protein, ur: 30 mg/dL — AB
Specific Gravity, Urine: 1.031 — ABNORMAL HIGH (ref 1.005–1.030)
pH: 6 (ref 5.0–8.0)

## 2023-01-19 LAB — DIFFERENTIAL
Abs Immature Granulocytes: 2.46 10*3/uL — ABNORMAL HIGH (ref 0.00–0.07)
Basophils Absolute: 0.2 10*3/uL — ABNORMAL HIGH (ref 0.0–0.1)
Basophils Relative: 1 %
Eosinophils Absolute: 0.1 10*3/uL (ref 0.0–0.5)
Eosinophils Relative: 0 %
Immature Granulocytes: 5 %
Lymphocytes Relative: 4 %
Lymphs Abs: 1.8 10*3/uL (ref 0.7–4.0)
Monocytes Absolute: 3 10*3/uL — ABNORMAL HIGH (ref 0.1–1.0)
Monocytes Relative: 6 %
Neutro Abs: 42.4 10*3/uL — ABNORMAL HIGH (ref 1.7–7.7)
Neutrophils Relative %: 84 %
Smear Review: INCREASED

## 2023-01-19 LAB — COMPREHENSIVE METABOLIC PANEL
ALT: 23 U/L (ref 0–44)
AST: 29 U/L (ref 15–41)
Albumin: 3.2 g/dL — ABNORMAL LOW (ref 3.5–5.0)
Alkaline Phosphatase: 94 U/L (ref 38–126)
Anion gap: 10 (ref 5–15)
BUN: 15 mg/dL (ref 8–23)
CO2: 21 mmol/L — ABNORMAL LOW (ref 22–32)
Calcium: 9.3 mg/dL (ref 8.9–10.3)
Chloride: 109 mmol/L (ref 98–111)
Creatinine, Ser: 1.41 mg/dL — ABNORMAL HIGH (ref 0.44–1.00)
GFR, Estimated: 38 mL/min — ABNORMAL LOW (ref >60)
Glucose, Bld: 120 mg/dL — ABNORMAL HIGH (ref 70–99)
Potassium: 3.9 mmol/L (ref 3.5–5.1)
Sodium: 140 mmol/L (ref 135–145)
Total Bilirubin: 2.4 mg/dL — ABNORMAL HIGH (ref <1.2)
Total Protein: 5.7 g/dL — ABNORMAL LOW (ref 6.5–8.1)

## 2023-01-19 LAB — PATHOLOGIST SMEAR REVIEW

## 2023-01-19 LAB — MAGNESIUM: Magnesium: 2.1 mg/dL (ref 1.7–2.4)

## 2023-01-19 LAB — CBC WITH DIFFERENTIAL/PLATELET
HCT: 37.5 % (ref 36.0–46.0)
Hemoglobin: 11.8 g/dL — ABNORMAL LOW (ref 12.0–15.0)
Lymphocytes Relative: 1 %
Lymphs Abs: 0.4 10*3/uL — ABNORMAL LOW (ref 0.7–4.0)
MCH: 29.9 pg (ref 26.0–34.0)
MCHC: 31.5 g/dL (ref 30.0–36.0)
MCV: 95.2 fL (ref 80.0–100.0)
Monocytes Absolute: 1.3 10*3/uL — ABNORMAL HIGH (ref 0.1–1.0)
Monocytes Relative: 3 %
Neutro Abs: 40.3 10*3/uL — ABNORMAL HIGH (ref 1.7–7.7)
Neutrophils Relative %: 96 %
Platelets: 1184 10*3/uL (ref 150–400)
RBC: 3.94 MIL/uL (ref 3.87–5.11)
RDW: 16 % — ABNORMAL HIGH (ref 11.5–15.5)
WBC: 42 10*3/uL — ABNORMAL HIGH (ref 4.0–10.5)
nRBC: 0.1 % (ref 0.0–0.2)

## 2023-01-19 LAB — TYPE AND SCREEN
ABO/RH(D): O POS
Antibody Screen: NEGATIVE

## 2023-01-19 LAB — BASIC METABOLIC PANEL
Anion gap: 10 (ref 5–15)
BUN: 15 mg/dL (ref 8–23)
CO2: 24 mmol/L (ref 22–32)
Calcium: 9.9 mg/dL (ref 8.9–10.3)
Chloride: 104 mmol/L (ref 98–111)
Creatinine, Ser: 1.04 mg/dL — ABNORMAL HIGH (ref 0.44–1.00)
GFR, Estimated: 55 mL/min — ABNORMAL LOW (ref >60)
Glucose, Bld: 172 mg/dL — ABNORMAL HIGH (ref 70–99)
Potassium: 3.3 mmol/L — ABNORMAL LOW (ref 3.5–5.1)
Sodium: 138 mmol/L (ref 135–145)

## 2023-01-19 LAB — PHOSPHORUS: Phosphorus: 3.7 mg/dL (ref 2.5–4.6)

## 2023-01-19 LAB — ABO/RH: ABO/RH(D): O POS

## 2023-01-19 LAB — MRSA NEXT GEN BY PCR, NASAL: MRSA by PCR Next Gen: NOT DETECTED

## 2023-01-19 LAB — TROPONIN I (HIGH SENSITIVITY)
Troponin I (High Sensitivity): 81 ng/L — ABNORMAL HIGH (ref <18)
Troponin I (High Sensitivity): 63 ng/L — ABNORMAL HIGH (ref <18)

## 2023-01-19 LAB — PROTIME-INR
INR: 1.4 — ABNORMAL HIGH (ref 0.8–1.2)
INR: 1.6 — ABNORMAL HIGH (ref 0.8–1.2)
Prothrombin Time: 17.7 s — ABNORMAL HIGH (ref 11.4–15.2)
Prothrombin Time: 18.9 s — ABNORMAL HIGH (ref 11.4–15.2)

## 2023-01-19 LAB — APTT
aPTT: 34 s (ref 24–36)
aPTT: 33 s (ref 24–36)

## 2023-01-19 LAB — BRAIN NATRIURETIC PEPTIDE: B Natriuretic Peptide: 597.5 pg/mL — ABNORMAL HIGH (ref 0.0–100.0)

## 2023-01-19 LAB — HEMOGLOBIN A1C
Hgb A1c MFr Bld: 6.1 % — ABNORMAL HIGH (ref 4.8–5.6)
Mean Plasma Glucose: 128.37 mg/dL

## 2023-01-19 LAB — LACTIC ACID, PLASMA: Lactic Acid, Venous: 1.6 mmol/L (ref 0.5–1.9)

## 2023-01-19 LAB — GLUCOSE, CAPILLARY: Glucose-Capillary: 128 mg/dL — ABNORMAL HIGH (ref 70–99)

## 2023-01-19 MED ORDER — VANCOMYCIN HCL 1250 MG/250ML IV SOLN
1250.0000 mg | Freq: Once | INTRAVENOUS | Status: AC
  Administered 2023-01-19: 1250 mg via INTRAVENOUS
  Filled 2023-01-19: qty 250

## 2023-01-19 MED ORDER — METOCLOPRAMIDE HCL 5 MG/ML IJ SOLN
10.0000 mg | Freq: Once | INTRAMUSCULAR | Status: AC
  Administered 2023-01-19: 10 mg via INTRAVENOUS
  Filled 2023-01-19: qty 2

## 2023-01-19 MED ORDER — CHLORHEXIDINE GLUCONATE CLOTH 2 % EX PADS
6.0000 | MEDICATED_PAD | Freq: Every day | CUTANEOUS | Status: DC
  Administered 2023-01-19 – 2023-01-27 (×9): 6 via TOPICAL

## 2023-01-19 MED ORDER — DOCUSATE SODIUM 100 MG PO CAPS: 100.0000 mg | ORAL_CAPSULE | Freq: Two times a day (BID) | ORAL | Status: DC | PRN

## 2023-01-19 MED ORDER — PIPERACILLIN-TAZOBACTAM 3.375 G IVPB 30 MIN
3.3750 g | Freq: Once | INTRAVENOUS | Status: AC
  Administered 2023-01-19: 3.375 g via INTRAVENOUS
  Filled 2023-01-19: qty 50

## 2023-01-19 MED ORDER — ESMOLOL HCL-SODIUM CHLORIDE 2000 MG/100ML IV SOLN
25.0000 ug/kg/min | INTRAVENOUS | Status: DC
Start: 2023-01-19 — End: 2023-01-23
  Administered 2023-01-19 (×3): 300 ug/kg/min via INTRAVENOUS
  Administered 2023-01-20: 200 ug/kg/min via INTRAVENOUS
  Administered 2023-01-20: 300 ug/kg/min via INTRAVENOUS
  Administered 2023-01-20: 250 ug/kg/min via INTRAVENOUS
  Administered 2023-01-20: 200 ug/kg/min via INTRAVENOUS
  Administered 2023-01-20 (×2): 300 ug/kg/min via INTRAVENOUS
  Administered 2023-01-20: 175 ug/kg/min via INTRAVENOUS
  Administered 2023-01-20: 225 ug/kg/min via INTRAVENOUS
  Administered 2023-01-20 (×3): 300 ug/kg/min via INTRAVENOUS
  Administered 2023-01-21 (×2): 275 ug/kg/min via INTRAVENOUS
  Administered 2023-01-21: 225 ug/kg/min via INTRAVENOUS
  Administered 2023-01-21: 275 ug/kg/min via INTRAVENOUS
  Administered 2023-01-21: 175 ug/kg/min via INTRAVENOUS
  Administered 2023-01-21: 275 ug/kg/min via INTRAVENOUS
  Administered 2023-01-21: 225 ug/kg/min via INTRAVENOUS
  Administered 2023-01-21: 200 ug/kg/min via INTRAVENOUS
  Administered 2023-01-21: 175 ug/kg/min via INTRAVENOUS
  Administered 2023-01-21: 200 ug/kg/min via INTRAVENOUS
  Administered 2023-01-21: 275.012 ug/kg/min via INTRAVENOUS
  Administered 2023-01-22: 215 ug/kg/min via INTRAVENOUS
  Administered 2023-01-22 (×3): 210 ug/kg/min via INTRAVENOUS
  Filled 2023-01-19 (×2): qty 200
  Filled 2023-01-19 (×9): qty 100
  Filled 2023-01-19: qty 200
  Filled 2023-01-19 (×5): qty 100
  Filled 2023-01-19: qty 200
  Filled 2023-01-19: qty 100
  Filled 2023-01-19: qty 200
  Filled 2023-01-19 (×2): qty 100

## 2023-01-19 MED ORDER — POLYETHYLENE GLYCOL 3350 17 G PO PACK
17.0000 g | PACK | Freq: Every day | ORAL | Status: DC | PRN
Start: 2023-01-19 — End: 2023-01-21

## 2023-01-19 MED ORDER — INSULIN ASPART 100 UNIT/ML IJ SOLN
0.0000 [IU] | INTRAMUSCULAR | Status: DC
  Administered 2023-01-20: 1 [IU] via SUBCUTANEOUS
  Administered 2023-01-20: 2 [IU] via SUBCUTANEOUS
  Administered 2023-01-20 – 2023-01-21 (×4): 1 [IU] via SUBCUTANEOUS
  Administered 2023-01-21: 2 [IU] via SUBCUTANEOUS
  Administered 2023-01-22: 1 [IU] via SUBCUTANEOUS
  Administered 2023-01-22 (×3): 2 [IU] via SUBCUTANEOUS
  Administered 2023-01-23: 1 [IU] via SUBCUTANEOUS
  Administered 2023-01-23: 2 [IU] via SUBCUTANEOUS
  Administered 2023-01-23: 1 [IU] via SUBCUTANEOUS

## 2023-01-19 MED ORDER — NICARDIPINE HCL IN NACL 20-0.86 MG/200ML-% IV SOLN
5.0000 mg/h | INTRAVENOUS | Status: DC
  Administered 2023-01-19: 5 mg/h via INTRAVENOUS
  Filled 2023-01-19 (×3): qty 200

## 2023-01-19 MED ORDER — ESMOLOL HCL-SODIUM CHLORIDE 2000 MG/100ML IV SOLN
25.0000 ug/kg/min | INTRAVENOUS | Status: DC
  Administered 2023-01-19: 300 ug/kg/min via INTRAVENOUS
  Administered 2023-01-19: 25 ug/kg/min via INTRAVENOUS
  Filled 2023-01-19: qty 100
  Filled 2023-01-19: qty 300
  Filled 2023-01-19: qty 100
  Filled 2023-01-19 (×2): qty 300

## 2023-01-19 MED ORDER — IOHEXOL 350 MG/ML SOLN
80.0000 mL | Freq: Once | INTRAVENOUS | Status: AC | PRN
  Administered 2023-01-19: 75 mL via INTRAVENOUS

## 2023-01-19 MED ORDER — ONDANSETRON HCL 4 MG/2ML IJ SOLN
4.0000 mg | Freq: Once | INTRAMUSCULAR | Status: AC
  Administered 2023-01-19: 4 mg via INTRAVENOUS
  Filled 2023-01-19: qty 2

## 2023-01-19 MED ORDER — IOHEXOL 300 MG/ML  SOLN
100.0000 mL | Freq: Once | INTRAMUSCULAR | Status: AC | PRN
  Administered 2023-01-19: 100 mL via INTRAVENOUS

## 2023-01-19 MED ORDER — CLEVIDIPINE BUTYRATE 0.5 MG/ML IV EMUL
0.0000 mg/h | INTRAVENOUS | Status: DC
Start: 2023-01-19 — End: 2023-01-22
  Administered 2023-01-19 – 2023-01-20 (×2): 2 mg/h via INTRAVENOUS
  Administered 2023-01-21: 4 mg/h via INTRAVENOUS
  Filled 2023-01-19 (×3): qty 100

## 2023-01-19 MED ORDER — ATORVASTATIN CALCIUM 40 MG PO TABS: 40.0000 mg | ORAL_TABLET | Freq: Every day | ORAL | Status: DC

## 2023-01-19 NOTE — ED Notes (Signed)
[facility] called. 

## 2023-01-19 NOTE — ED Provider Notes (Signed)
[facility] Provider Note    Event Date/Time   First MD Initiated Contact with Patient 01/19/23 1219     (approximate)   History   Chest Pain and Weakness   HPI Kristy Sullivan is a 77 y.o. female with history of HTN, A-fib, essential thrombocytopenia presenting today for multiple pain symptoms.  Patient reports yesterday she had an episode of weakness in both her legs.  She has also intermittently had chest pains, abdominal pain, and lower leg pain.  Currently states that she only has abdominal pain.  Denies headache, vision changes, numbness, unilateral weakness, or speech changes.  Patient reportedly had been on medication in the past for her essential thrombocytosis and A-fib.  She is not taking medications for either of those.  Not currently on blood thinners even though she has prescribed them.  Reviewed prior oncology notes.     Physical Exam   Triage Vital Signs: ED Triage Vitals  Encounter Vitals Group     BP 01/19/23 1122 (!) 140/81     Systolic BP Percentile --      Diastolic BP Percentile --      Pulse Rate 01/19/23 1122 (!) 136     Resp 01/19/23 1122 15     Temp 01/19/23 1122 98.2 F (36.8 C)     Temp Source 01/19/23 1122 Oral     SpO2 01/19/23 1122 94 %     Weight 01/19/23 1123 155 lb (70.3 kg)     Height 01/19/23 1123 5\' 3"  (1.6 m)     Head Circumference --      Peak Flow --      Pain Score 01/19/23 1123 0     Pain Loc --      Pain Education --      Exclude from Growth Chart --     Most recent vital signs: Vitals:   01/19/23 1230 01/19/23 1302  BP:  (!) 154/120  Pulse:  88  Resp:  (!) 25  Temp:    SpO2: 96% 94%   Physical Exam: I have reviewed the vital signs and nursing notes. General: Awake, alert, no acute distress.  Nontoxic appearing. Head:  Atraumatic, normocephalic.   ENT:  EOM intact, PERRL. Oral mucosa is pink and moist with no lesions. Neck: Neck is supple with full range of motion, No meningeal  signs. Cardiovascular:  RRR, No murmurs. Peripheral pulses palpable and equal bilaterally. Respiratory:  Symmetrical chest wall expansion.  No rhonchi, rales, or wheezes.  Good air movement throughout.  No use of accessory muscles.   Musculoskeletal:  No cyanosis.  1+ pitting edema to bilateral lower extremities.  Moving extremities with full ROM Abdomen:  Soft, nontender, nondistended. Neuro:  GCS 15, moving all four extremities, interacting appropriately. Speech clear. Psych:  Calm, appropriate.   Skin:  Warm, dry, no rash.    ED Results / Procedures / Treatments   Labs (all labs ordered are listed, but only abnormal results are displayed) Labs Reviewed  BASIC METABOLIC PANEL - Abnormal; Notable for the following components:      Result Value   Potassium 3.3 (*)    Glucose, Bld 172 (*)    Creatinine, Ser 1.04 (*)    GFR, Estimated 55 (*)    All other components within normal limits  CBC - Abnormal; Notable for the following components:   WBC 50.3 (*)    RDW 16.0 (*)    Platelets 1,296 (*)    All other components within  normal limits  DIFFERENTIAL - Abnormal; Notable for the following components:   Neutro Abs 42.4 (*)    Monocytes Absolute 3.0 (*)    Basophils Absolute 0.2 (*)    Abs Immature Granulocytes 2.46 (*)    All other components within normal limits  CBC - Abnormal; Notable for the following components:   WBC 50.0 (*)    RDW 16.2 (*)    Platelets 1,315 (*)    All other components within normal limits  PROTIME-INR - Abnormal; Notable for the following components:   Prothrombin Time 17.7 (*)    INR 1.4 (*)    All other components within normal limits  BRAIN NATRIURETIC PEPTIDE - Abnormal; Notable for the following components:   B Natriuretic Peptide 597.5 (*)    All other components within normal limits  TROPONIN I (HIGH SENSITIVITY) - Abnormal; Notable for the following components:   Troponin I (High Sensitivity) 81 (*)    All other components within normal  limits  TROPONIN I (HIGH SENSITIVITY) - Abnormal; Notable for the following components:   Troponin I (High Sensitivity) 63 (*)    All other components within normal limits  CULTURE, BLOOD (ROUTINE X 2)  CULTURE, BLOOD (ROUTINE X 2)  LACTIC ACID, PLASMA  APTT  PATHOLOGIST SMEAR REVIEW  LACTIC ACID, PLASMA  URINALYSIS, W/ REFLEX TO CULTURE (INFECTION SUSPECTED)     EKG My EKG interpretation: Rate of 105, A-fib with RVR.  Left axis deviation.  No acute ST elevations or depressions   RADIOLOGY Independently interpreted CT abdomen/pelvis.  Discussed with radiologist as well.  Concern for possible dissection although not very well-seen given venous study.  Will follow this up with CTA chest/abdomen/pelvis.   PROCEDURES:  Critical Care performed: Yes, see critical care procedure note(s)  .Critical Care  Performed by: Janith Lima, MD Authorized by: Janith Lima, MD   Critical care provider statement:    Critical care time (minutes):  30   Critical care was necessary to treat or prevent imminent or life-threatening deterioration of the following conditions: Aortic dissection, possible sepsis.   Critical care was time spent personally by me on the following activities:  Development of treatment plan with patient or surrogate, discussions with consultants, evaluation of patient's response to treatment, examination of patient, ordering and review of laboratory studies, ordering and review of radiographic studies, ordering and performing treatments and interventions, pulse oximetry, re-evaluation of patient's condition and review of old charts    MEDICATIONS ORDERED IN ED: Medications  esmolol (BREVIBLOC) 2000 mg / 100 mL (20 mg/mL) infusion (has no administration in time range)  iohexol (OMNIPAQUE) 300 MG/ML solution 100 mL (100 mLs Intravenous Contrast Given 01/19/23 1307)  ondansetron (ZOFRAN) injection 4 mg (4 mg Intravenous Given 01/19/23 1431)  iohexol (OMNIPAQUE) 350 MG/ML  injection 80 mL (75 mLs Intravenous Contrast Given 01/19/23 1507)     IMPRESSION / MDM / ASSESSMENT AND PLAN / ED COURSE  I reviewed the triage vital signs and the nursing notes.                              Differential diagnosis includes, but is not limited to, sepsis, pancreatitis, AAA, dissection, pneumonia, CHF exacerbation, leukemia  Patient's presentation is most consistent with acute presentation with potential threat to life or bodily function.  Patient is a 77 year old female presenting today for vague chest and abdominal pain associated with weakness.  On arrival, she is in A-fib  with RVR but rate naturally became controlled without any medical intervention.  No acute neurological deficits.  No hypoxia on room air.  Physical exam does show edema in her lower extremities and currently not on Eliquis like she is supposed to be prescribed, so we will get duplex of her bilateral lower extremities.  This shows no evidence of a DVT.  Patient noted to have profound leukocytosis and thrombocytosis concerning for possible leukemia given prior history of essential thrombocytosis.  However, once differential resulted it was predominantly neutrophils making this less likely and concerning more for sepsis although no obvious source on exam.  CT abdomen/pelvis was ordered.  No direct infectious causes seen but radiologist did call me regarding filling defect concerning for possible thoracic aorta dissection along with notable likely renal infarct on the left side.  Follow-up CTA chest/abdomen/pelvis was ordered at this time to evaluate for dissection.  Patient was also started on an esmolol drip while awaiting CT and results for further impulse control.  Patient was signed out to oncoming provider pending results of CTA chest/abdomen/pelvis suspect admission versus transfer for definitive care.  The patient is on the cardiac monitor to evaluate for evidence of arrhythmia and/or significant heart rate  changes. Clinical Course as of 01/19/23 1531  Mon Jan 19, 2023  1423 DG Chest [place] 1 View Findings favoring congestive heart failure.  Cannot fully rule out pneumonia so follow-up CTA chest will be ordered. [DW]  1450 CT ABDOMEN PELVIS W CONTRAST  There is crescentic filling defect along the left/posterior wall of the lower thoracic aorta extending inferiorly into the abdomen up to the level of origin of inferior mesenteric artery. There is probable dissection flap extending into the proximal left renal artery which exhibit hypoattenuating area in the lower pole, favored to represent renal infarction. 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 Further imaging with CT angiography chest, abdomen and pelvis as per dissection protocol is recommended. [DW]  1453 Radiology - also infarct in the left kidney. Suspect dissection with thrombosed flap causing symptoms. 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   Janith Lima, MD 01/19/23 [phone]

## 2023-01-19 NOTE — Progress Notes (Signed)
eLink Physician-Brief Progress Note Patient Name: Mishay Lucks DOB: February 23, 1946 MRN: [id number]   Date of Service  01/19/2023  HPI/Events of Note  77 year old female with a history of stroke, dyslipidemia, essential hypertension, and persistent atrial fibrillation on chronic anticoagulation with Eliquis with questionable adherence that presents with intermittent abdominal and chest pain found to have intramural hematoma at the level of the arch extending down to renal arteries.  Admitted for medical management.  On examination, vital signs are within normal limits on esmolol infusion and after broad-spectrum antibiotics.  Requiring 4 L of oxygen to maintain saturation 96%.  Laboratory studies consistent with elevated creatinine, elevated INR, and CT angiography consistent with previously noted aneurysmal EKG, pulmonary hypertension, trace pericardial effusion.  eICU Interventions  Anticoagulation is being held  Maintaining tight SBP and heart rate goals with clevidipine and esmolol as needed.  SCDs for DVT prophylaxis GI prophylaxis not indicated.  Maintain NPO.     Intervention Category Evaluation Type: New Patient Evaluation  Sewell Pitner 01/19/2023, 11:15 PM

## 2023-01-19 NOTE — ED Notes (Signed)
..  EMTALA: REQUIRED DOCUMENTATION COMPLETED AND REVIEWED BY WRITER PRIOR TO PT TRANSFER MD REASSESSMENT EMTALA RN SECTION TRANSFER E-SIGN VS WITHIN REQUIRED TIME

## 2023-01-19 NOTE — ED Provider Notes (Signed)
4:06 PM Assumed care for off going team.   Blood pressure (!) 165/102, pulse (!) 101, temperature 98.1 F (36.7 C), temperature source Oral, resp. rate (!) 23, height 5\' 3"  (1.6 m), weight 70.3 kg, SpO2 98%.  See their HPI for full report but in brief pending CT read-  4:06 PM I took over for Dr. Anner Crete.  He had paged radiology to try to get a stat read on this given this would significantly alter the further treatment for patient could require transfer  4:27 PM we have paged radiology multiple times to try to get a stat read given the concern for potential dissection based upon the original read.  4:31 PM call from rads that they are working on read but consulting with another radiologist on the read.   5:06 PM Call from rads confirming that there is an intramural hemorrhage in descending thoracic aorta. This is non operative. D/w Dr Gilda Crease who [facility] BP/HR control.  I reevaluated patient and I talk to the nurse and changed the heart rate parameters to be less than 60 = to increase the esmolol every 2 minutes rather then 20 minutes.  I also added on nicardipine for blood pressure control with a goal of  100-120.  I discussed with the nurse to try to get the started to soon as possible to aggressively treat both blood pressure and heart rate.  5:08 PM Dr hendricks ct surgery who recommends discussing with vascular at [facility].  6:31 PM unable to get a hold of vascular surgery from [facility] so I did discuss with Karie Fetch from the ICU at [facility] who did accept patient for transfer.  Updated family.  .Critical Care  Performed by: Concha Se, MD Authorized by: Concha Se, MD   Critical care provider statement:    Critical care time (minutes):  30   Critical care was necessary to treat or prevent imminent or life-threatening deterioration of the following conditions: intural hemorrhage.   Critical care was time spent personally by  me on the following activities:  Development of treatment plan with patient or surrogate, discussions with consultants, evaluation of patient's response to treatment, examination of patient, ordering and review of laboratory studies, ordering and review of radiographic studies, ordering and performing treatments and interventions, pulse oximetry, re-evaluation of patient's condition and review of old charts              Concha Se, MD 01/19/23 [phone]

## 2023-01-19 NOTE — Progress Notes (Signed)
PHARMACY -  BRIEF ANTIBIOTIC NOTE   Pharmacy has received consult(s) for vancomycin from an ED provider.  The patient's profile has been reviewed for ht/wt/allergies/indication/available labs.    One time order(s) placed for vancomycin 1250 mg IV x 1  Further antibiotics/pharmacy consults should be ordered by admitting physician if indicated.                       Thank you, Lowella Bandy 01/19/2023  6:21 PM

## 2023-01-19 NOTE — Progress Notes (Signed)
   PCCM transfer request    Sending physician: [facility] Plan- ED  Sending facility: [facility] ED  Reason for transfer: descending aortic intramural hematoma  Brief case summary: HTN, presented with CP, had descending thoracic aortic intramural hematoma. On esmolol and nicardipine. SBP now 90s, HR in 60s  Recommendations made prior to transfer: Con't nicardipine, esmolol. Goal SBP <120, HR <60. Agree with antibiotics; blood cultures previously collected.   I was able to reach vascular surgery at [facility], who is now aware of the patient. They will be formally consulted on arrival.  Transfer accepted: yes    Steffanie Dunn 01/19/23 6:30 PM [facility]  For contact information, see Amion. If no response to pager, please call PCCM consult pager. After hours, 7PM- 7AM, please call [place].

## 2023-01-19 NOTE — ED Notes (Signed)
Pt O2 sat was at 86%. placed pt on 2L nasal cannula

## 2023-01-19 NOTE — ED Triage Notes (Addendum)
Pt in via EMS, patient reports intermittent chest pain yesterday, but today while in shower, had an episode of generalized weakness.  During triage, heart rate ranging from 90's-130's; EKG looks like new onset Afibb.  Patient denies hx of same.  Patient denies any pain upon arrival, NAD noted at this time.

## 2023-01-19 NOTE — ED Triage Notes (Signed)
First nurse note: Pt here via [facility] from home with CP. Pt has no known HX of afib, EMS states EKG showed a-fib with multifocal PVCs.   324 ASA given by EMS

## 2023-01-19 NOTE — ED Notes (Signed)
CT came for pt, MD stated to start esmolol once pt returns

## 2023-01-19 NOTE — H&P (Signed)
NAME:  Kristy Sullivan, MRN:  [id number], DOB:  08-May-1945, LOS: 0 ADMISSION DATE:  01/19/2023 CONSULTATION DATE:  01/19/2023 REFERRING MD:  Fuller Plan [facility] EDP CHIEF COMPLAINT:  Chest pain  History of Present Illness:  77 year old woman who presented to [facility] ED 11/18 for chest pain, intermittent abdominal pain and generalized weakness (primarily BLE). PMHx significant for HTN, HLD, AFib (on Eliquis, reportedly not taking), CVA (embolic, MRI Brain 09/2019 with multiple punctate acute infarcts R parietal lobe, c/f R MCA distribution), thrombocytosis.  History is primarily obtained from chart review; it seems patient may be a poor historian as she denies history of AFib but this is well-documented in her chart. Additionally, per last Cardiology/Heme/Onc notes, patient was supposed to be on ASA, Eliquis, statin, hydroxyurea.  On ED arrival, patient was hypertensive to 140s and in Afib with rates 90s-130s. Labs were notable for WBC 50, Hgb 13.1, Plt 1.3 million. INR 1.4, APTT 34. Na 138, K 3.3, CO2 24, Cr 1.04. Trop 81 > 63. BNP 597.5. LA 1.6. UA unremarkable. CXR with findings c/f CHF/pulmonary edema; CTA Chest/A/P with aneurysmal disease of the thoracic aorta at the level of the distal arch/descending aorta with component of acute intramural hemorrhage (arch max caliber 3.9cm, descending aorta max caliber 3.7cm) with continuation into descending aorta likely ending before the origin of the renal arteries. LE Dopplers negative for DVT. TCTS consulted with recommendation for VVS consult, as condition likely nonoperative.  PCCM consulted for ICU admission/transfer to [facility] for further care.  Pertinent Medical History:   Past Medical History:  Diagnosis Date   Embolic stroke (HCC)    a. 09/2019 MRI: several punctate acute infarcts in R parietal lobe affecting the cortical and subcortical brain consistent w/ micro embolic infarcts in R MCA branch vessel distribution.   Hyperlipidemia    Hypertension     Persistent atrial fibrillation (HCC)    a. 09/2019 Dx in setting of ED visit for weakness-->embolic stroke; b. CHA2DS2VASc = 6-->eliquis; c. 09/2019 Echo: EF 50-55%, no rwma, mild LVH. Nl RV size/fxn. Mild LAE, mod RAE. Mild to mod MR.   Thrombocytosis    Significant Hospital Events: Including procedures, antibiotic start and stop dates in addition to other pertinent events   11/18 - Presented to [facility] ED for CP/abdominal. Found to have descending aortic intramural hematoma. PCCM consulted for transfer to [facility] for higher level of care. VVS aware.  Interim History / Subjective:  PCCM consulted for ICU admission Transferred from [facility] No CP/abdominal pain at present  Objective:  There were no vitals taken for this visit.       No intake or output data in the 24 hours ending 01/19/23 2151 There were no vitals filed for this visit.  Physical Examination: General: Chronically ill-appearing elderly woman in NAD. HEENT: [place]/AT, anicteric sclera, PERRL, dry mucous membranes. Neuro: Awake, oriented x 4. Responds to verbal stimuli. Following commands consistently. Moves all 4 extremities spontaneously. Generalized weakness.  CV: RRR rate 60s. PULM: Breathing even and unlabored on RA. GI: Soft, nontender, nondistended. Normoactive bowel sounds. Extremities: Trace bilateral symmetric LE edema noted. Skin: Warm/dry, no rashes.  Resolved Hospital Problem List:    Assessment & Plan:  Descending aortic intramural hematoma CTA Chest/A/P with aneurysmal disease of the thoracic aorta at the level of the distal arch/descending aorta with component of acute intramural hemorrhage (arch max caliber 3.9cm, descending aorta max caliber 3.7cm) with continuation into descending aorta likely ending before the origin of the renal arteries. - Admit to ICU  at [facility] for further care - Aggressive BP control with goal SBP < 120, goal HR < 60 - Esmolol gtt/Cleviprex gtt titrated to goal HR/SBP, d/c Cardene - VVS aware  of patient, appreciate evaluation/recs - Likely conservative/nonoperative management  Leukocytosis Unclear if hematologic/oncologic or acute in the setting of developing infectious etiology. - Trend WBC, fever curve - F/u Cx data - Continue broad-spectrum antibiotics (vanc/Zosyn)  Afib CHA?DS?-VASc score 6. Per Cardiology notes from 09/2019, prescribled Lopressor for rate control and Eliquis for [facility]. Patient reports she is not taking these medications, initially never started due to cost concerns but was then provided patient assistance and did eventually start Eliquis, but is not on it currently. Of note, patient also is JAK2+ and high risk. - Cardiac monitoring - Optimize electrolytes for K > 4, Mg > 2 - Hold AC for now given intramural hematoma, will eventually require AC resumption in the setting of Afib/thrombocytosis/JAK2 positivity - LE Dopplers fortunately negative for DVT - F/u Echo  HTN HLD - Esmolol/Cleviprex gtts as above - Goal SBP < 120 - Hold home metoprolol for now - ASA/statin  Prior CVA, embolic CTA Head/Neck 09/2019 without LVO, +small infarctions of R parietal region c/w microembolic infarctions. MRI Brain 09/2019 with several punctate acute infarctions R parietal lobe (cortical/subcortical) c/w microembolic infarctions in R MCA branch vessel distribution. - Multiple stroke risk factors - Aggressive secondary stroke prevention as indicated - Obtain lipid panel, check A1c, Echo as above  Essential thrombocytosis with splenomegaly JAK-2 positive Seen by Heme/Onc 10/2019, placed on hydroxyurea.  - Trend Plt - Heme/Onc consult as indicated - Consider hydroxyurea resumption  Meds 07/2020: Voltaren gel - R shoulder Eliquis 5mg  BID ASA 81mg  daily Lipitor 40mg  daily Cod liver il Lopressor 25mg  BID MV Hydroxyurea 1000mg  BID?  Best Practice: (right click and "Reselect all SmartList Selections" daily)   Diet/type: NPO DVT prophylaxis: SCDs, hold AC until  determination of surgical need versus conservative management GI prophylaxis: N/A Lines: N/A Foley:  N/A Code Status:  full code Last date of multidisciplinary goals of care discussion [Pending]  Labs:  CBC: Recent Labs  Lab 01/19/23 1125  WBC 50.0*  50.3*  NEUTROABS 42.4*  HGB 13.1  13.0  HCT 40.9  40.7  MCV 96.9  96.9  PLT 1,315*  1,296*   Basic Metabolic Panel: Recent Labs  Lab 01/19/23 1125  NA 138  K 3.3*  CL 104  CO2 24  GLUCOSE 172*  BUN 15  CREATININE 1.04*  CALCIUM 9.9   GFR: Estimated Creatinine Clearance: 41.3 mL/min (A) (by C-G formula based on SCr of 1.04 mg/dL (H)). Recent Labs  Lab 01/19/23 1125 01/19/23 1431  WBC 50.0*  50.3*  --   LATICACIDVEN  --  1.6   Liver Function Tests: No results for input(s): "AST", "ALT", "ALKPHOS", "BILITOT", "PROT", "ALBUMIN" in the last 168 hours. No results for input(s): "LIPASE", "AMYLASE" in the last 168 hours. No results for input(s): "AMMONIA" in the last 168 hours.  ABG: No results found for: "PHART", "PCO2ART", "PO2ART", "HCO3", "TCO2", "ACIDBASEDEF", "O2SAT"   Coagulation Profile: Recent Labs  Lab 01/19/23 1431  INR 1.4*   Cardiac Enzymes: No results for input(s): "CKTOTAL", "CKMB", "CKMBINDEX", "TROPONINI" in the last 168 hours.  HbA1C: Hgb A1c MFr Bld  Date/Time Value Ref Range Status  10/01/2019 06:10 AM 6.1 (H) 4.8 - 5.6 % Final    Comment:    (NOTE) Pre diabetes:          5.7%-6.4%  Diabetes:              >  6.4%  Glycemic control for   <7.0% adults with diabetes    CBG: Recent Labs  Lab 01/19/23 2116  GLUCAP 128*    Review of Systems:   Review of systems completed with pertinent positives/negatives outlined in above HPI.  Past Medical History:  She,  has a past medical history of Embolic stroke (HCC), Hyperlipidemia, Hypertension, Persistent atrial fibrillation (HCC), and Thrombocytosis.   Surgical History:  No past surgical history on file.   Social History:    reports that she has never smoked. She has never used smokeless tobacco. She reports that she does not drink alcohol and does not use drugs.   Family History:  Her family history includes High blood pressure in her mother; Hypertension in her brother.   Allergies: No Known Allergies   Home Medications: Prior to Admission medications   Not on File    Critical care time:   The patient is critically ill with multiple organ system failure and requires high complexity decision making for assessment and support, frequent evaluation and titration of therapies, advanced monitoring, review of radiographic studies and interpretation of complex data.   Critical Care Time devoted to patient care services, exclusive of separately billable procedures, described in this note is 41 minutes.  Tim Lair, PA-C [facility] 01/19/23 9:51 PM  Please see [email or web address] for pager details.  From 7A-7P if no response, please call [phone] After hours, please call ELink [phone]

## 2023-01-20 ENCOUNTER — Encounter (HOSPITAL_COMMUNITY): Payer: Self-pay | Admitting: Critical Care Medicine

## 2023-01-20 DIAGNOSIS — N179 Acute kidney failure, unspecified: Secondary | ICD-10-CM

## 2023-01-20 DIAGNOSIS — I161 Hypertensive emergency: Secondary | ICD-10-CM | POA: Diagnosis not present

## 2023-01-20 DIAGNOSIS — I71012 Dissection of descending thoracic aorta: Secondary | ICD-10-CM | POA: Diagnosis not present

## 2023-01-20 LAB — PHOSPHORUS: Phosphorus: 4.1 mg/dL (ref 2.5–4.6)

## 2023-01-20 LAB — GLUCOSE, CAPILLARY
Glucose-Capillary: 109 mg/dL — ABNORMAL HIGH (ref 70–99)
Glucose-Capillary: 114 mg/dL — ABNORMAL HIGH (ref 70–99)
Glucose-Capillary: 120 mg/dL — ABNORMAL HIGH (ref 70–99)
Glucose-Capillary: 126 mg/dL — ABNORMAL HIGH (ref 70–99)
Glucose-Capillary: 131 mg/dL — ABNORMAL HIGH (ref 70–99)
Glucose-Capillary: 139 mg/dL — ABNORMAL HIGH (ref 70–99)
Glucose-Capillary: 162 mg/dL — ABNORMAL HIGH (ref 70–99)

## 2023-01-20 LAB — CBC
HCT: 35 % — ABNORMAL LOW (ref 36.0–46.0)
Hemoglobin: 11.1 g/dL — ABNORMAL LOW (ref 12.0–15.0)
MCH: 30.2 pg (ref 26.0–34.0)
MCHC: 31.7 g/dL (ref 30.0–36.0)
MCV: 95.1 fL (ref 80.0–100.0)
Platelets: 1097 10*3/uL (ref 150–400)
RBC: 3.68 MIL/uL — ABNORMAL LOW (ref 3.87–5.11)
RDW: 16 % — ABNORMAL HIGH (ref 11.5–15.5)
WBC: 41.6 10*3/uL — ABNORMAL HIGH (ref 4.0–10.5)
nRBC: 0.1 % (ref 0.0–0.2)

## 2023-01-20 LAB — BASIC METABOLIC PANEL
Anion gap: 9 (ref 5–15)
BUN: 15 mg/dL (ref 8–23)
CO2: 23 mmol/L (ref 22–32)
Calcium: 9.3 mg/dL (ref 8.9–10.3)
Chloride: 107 mmol/L (ref 98–111)
Creatinine, Ser: 1.5 mg/dL — ABNORMAL HIGH (ref 0.44–1.00)
GFR, Estimated: 35 mL/min — ABNORMAL LOW (ref >60)
Glucose, Bld: 134 mg/dL — ABNORMAL HIGH (ref 70–99)
Potassium: 3.8 mmol/L (ref 3.5–5.1)
Sodium: 139 mmol/L (ref 135–145)

## 2023-01-20 LAB — LIPID PANEL
Cholesterol: 154 mg/dL (ref 0–200)
HDL: 53 mg/dL (ref >40)
LDL Cholesterol: 89 mg/dL (ref 0–99)
Total CHOL/HDL Ratio: 2.9 {ratio}
Triglycerides: 60 mg/dL (ref <150)
VLDL: 12 mg/dL (ref 0–40)

## 2023-01-20 LAB — PROCALCITONIN: Procalcitonin: 0.22 ng/mL

## 2023-01-20 LAB — MAGNESIUM: Magnesium: 2 mg/dL (ref 1.7–2.4)

## 2023-01-20 MED ORDER — PIPERACILLIN-TAZOBACTAM 3.375 G IVPB
3.3750 g | Freq: Three times a day (TID) | INTRAVENOUS | Status: DC
  Administered 2023-01-20 – 2023-01-21 (×3): 3.375 g via INTRAVENOUS
  Filled 2023-01-20 (×3): qty 50

## 2023-01-20 MED ORDER — ASPIRIN 81 MG PO CHEW
81.0000 mg | CHEWABLE_TABLET | Freq: Every day | ORAL | Status: DC
  Administered 2023-01-20 – 2023-01-27 (×8): 81 mg via ORAL
  Filled 2023-01-20 (×8): qty 1

## 2023-01-20 MED ORDER — VANCOMYCIN HCL 750 MG/150ML IV SOLN: 750.0000 mg | INTRAVENOUS | Status: DC

## 2023-01-20 MED ORDER — ATORVASTATIN CALCIUM 40 MG PO TABS
40.0000 mg | ORAL_TABLET | Freq: Every day | ORAL | Status: DC
  Administered 2023-01-20 – 2023-01-27 (×8): 40 mg via ORAL
  Filled 2023-01-20 (×9): qty 1

## 2023-01-20 MED ORDER — METOPROLOL TARTRATE 50 MG PO TABS
50.0000 mg | ORAL_TABLET | Freq: Two times a day (BID) | ORAL | Status: DC
  Administered 2023-01-20 – 2023-01-21 (×3): 50 mg via ORAL
  Filled 2023-01-20 (×3): qty 1

## 2023-01-20 MED ORDER — AMLODIPINE BESYLATE 10 MG PO TABS
10.0000 mg | ORAL_TABLET | Freq: Every day | ORAL | Status: DC
  Administered 2023-01-20 – 2023-01-22 (×3): 10 mg via ORAL
  Filled 2023-01-20 (×3): qty 1

## 2023-01-20 MED ORDER — PIPERACILLIN-TAZOBACTAM 3.375 G IVPB: 3.3750 g | Freq: Three times a day (TID) | INTRAVENOUS | Status: DC

## 2023-01-20 MED ORDER — VANCOMYCIN HCL 1250 MG/250ML IV SOLN: 1250.0000 mg | INTRAVENOUS | Status: DC

## 2023-01-20 NOTE — Plan of Care (Signed)

## 2023-01-20 NOTE — Consult Note (Signed)
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  She cannot quite remember why she stopped following him and why she stopped taking the Hydrea.  She is now admitted with chief complaint of abdominal pain and during her ER workup was found to have an aortic intramural hematoma for which vascular surgery was consulted.  She is currently on strict blood pressure control and is planned to have another CT in 48 hours to follow-up on the hematoma.  She was found to have white blood cell count of 50,000, mild anemia, extreme thrombocytosis, neutrophilia and hematology was consulted to follow-up on this. She was on Hydrea 3 years ago and her counts were very well-controlled but she for some reason was lost to follow-up and discontinued the Hydrea.  She was on anticoagulation until her visit to the ER.  I would recommend that she go back for a follow-up to Dr. Donneta Romberg in [place] upon discharge.  I would not want to start her on Hydrea during her inpatient hospitalization.  When and if cleared by vascular surgery and the other respective specialities, she should get back on anticoagulation since JAK2 positive essential thrombocytosis is associated with high risk of arterial and venous thromboembolic events.  I have sent a message to Dr. Sharlette Dense office.  She understands the need for follow-up and she agrees with it. At this time there are no active recommendations from hematology.  We can follow-up as needed.  Thank you for consulting [place] in the care of this patient.   Please do not hesitate to contact us with any additional questions or concerns.  The length of time of the face-to-face encounter was 55 minutes. More than 50% of time was spent counseling and coordination of care   Thank you for this referral.    HPI:   High risk ET, JAK 2 positive, was on hydrea and anticoagulation lost to follow up, previously seen by Dr Donneta Romberg presenting with weakness, intermittent abdominal pain, chest pain and was found to have intramural hematoma at the level of arch extending down to the renal arteries.  Hematology was consulted to assist with the ET and related leukocytosis and extreme thrombocytosis. She was on anticoagulation for high risk ET, this was held because of the hematoma.  Interval history  She was awake and alert at the time of my visit, her niece was also present at the time of my visit.  According to the patient, she was lost to follow-up and did not go back to see the hematologist in [place].  Niece mentioned that she was complaining of weakness and abdominal pain which was the primary reason to come to the hospital.   Past Medical History:  Diagnosis Date   Embolic stroke (HCC)    a. 09/2019 MRI: several punctate acute infarcts in R parietal lobe affecting the cortical and subcortical brain consistent w/ micro embolic infarcts in R MCA branch vessel distribution.   Hyperlipidemia    Hypertension    Persistent atrial fibrillation (HCC)    a. 09/2019 Dx in setting of ED visit for weakness-->embolic stroke; b. CHA2DS2VASc = 6-->eliquis; c. 09/2019  Echo: EF 50-55%, no rwma, mild LVH. Nl RV size/fxn. Mild LAE, mod RAE. Mild to mod MR.   Thrombocythemia, essential (HCC)    Current Facility-Administered Medications  Medication Dose Route Frequency Provider Last Rate Last Admin   amLODipine (NORVASC) tablet 10 mg  10 mg Oral Daily Karie Fetch P, DO   10 mg at 01/20/23 1127   aspirin chewable tablet 81 mg  81 mg Oral Daily Desai, Rahul P, PA-C    81 mg at 01/20/23 2956   atorvastatin (LIPITOR) tablet 40 mg  40 mg Oral Daily Desai, Rahul P, PA-C   40 mg at 01/20/23 2130   Chlorhexidine Gluconate Cloth 2 % PADS 6 each  6 each Topical Daily Steffanie Dunn, DO   6 each at 01/19/23 2115   clevidipine (CLEVIPREX) infusion 0.5 mg/mL  0-21 mg/hr Intravenous Continuous Lorin Glass, MD 4 mL/hr at 01/20/23 1150 2 mg/hr at 01/20/23 1150   docusate sodium (COLACE) capsule 100 mg  100 mg Oral BID PRN Tim Lair, PA-C       esmolol (BREVIBLOC) 2000 mg / 100 mL (20 mg/mL) infusion  25-300 mcg/kg/min Intravenous Titrated Lorin Glass, MD 52.7 mL/hr at 01/20/23 1139 250 mcg/kg/min at 01/20/23 1139   insulin aspart (novoLOG) injection 0-9 Units  0-9 Units Subcutaneous Q4H Lorin Glass, MD   1 Units at 01/20/23 8657   metoprolol tartrate (LOPRESSOR) tablet 50 mg  50 mg Oral BID Steffanie Dunn, DO   50 mg at 01/20/23 8469   piperacillin-tazobactam (ZOSYN) IVPB 3.375 g  3.375 g Intravenous Q8H Kendal Hymen, RPH 12.5 mL/hr at 01/20/23 1138 3.375 g at 01/20/23 1138   polyethylene glycol (MIRALAX / GLYCOLAX) packet 17 g  17 g Oral Daily PRN Cloyd Stagers M, PA-C       [START ON 01/21/2023] vancomycin (VANCOREADY) IVPB 1250 mg/250 mL  1,250 mg Intravenous Q48H Hurth, Kimberly P, RPH         No Known Allergies:   Family History  Problem Relation Age of Onset   High blood pressure Mother    Hypertension Brother   :   Social History   Socioeconomic History   Marital status: Married    Spouse name: Makynleigh Houx   Number of children: 3   Years of education: Not on file   Highest education level: Not on file  Occupational History   Not on file  Tobacco Use   Smoking status: Never   Smokeless tobacco: Never  Vaping Use   Vaping status: Never Used  Substance and Sexual Activity   Alcohol use: Never   Drug use: Never   Sexual activity: Never  Other Topics Concern   Not on file  Social History Narrative   Never smoked;  no alcohol; used to [employer]; 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    Social Determinants of Health   Financial Resource Strain: Not on file  Food Insecurity: No Food Insecurity (01/19/2023)   Hunger Vital Sign    Worried About Running Out of Food in the Last Year: Never true    Ran Out of Food in the Last Year: Never true  Transportation Needs: No Transportation Needs (01/19/2023)   PRAPARE - Administrator, Civil Service (Medical): No    Lack of Transportation (Non-Medical): No  Physical Activity: Not on file  Stress: Not on file  Social Connections: Not on file  Intimate Partner Violence: Not At Risk (01/19/2023)   Humiliation,  Afraid, Rape, and Kick questionnaire    Fear of Current or Ex-Partner: No    Emotionally Abused: No    Physically Abused: No    Sexually Abused: No    Exam: Patient Vitals for the past 24 hrs:  BP Temp Temp src Pulse Resp SpO2 Height Weight  01/20/23 1200 (!) 91/59 -- -- 68 17 91 % -- --  01/20/23 1118 -- 98.7 F (37.1 C) Oral -- -- -- -- --  01/20/23 1100 (!) 87/66 -- -- 79 (!) 23 97 % -- --  01/20/23 1000 94/72 -- -- 77 18 97 % -- --  01/20/23 0900 97/68 -- -- 65 (!) 22 98 % -- --  01/20/23 0800 90/63 -- -- 75 15 97 % -- --  01/20/23 0700 96/66 -- -- 79 16 95 % -- --  01/20/23 0600 (!) 98/56 -- -- 78 12 98 % -- --  01/20/23 0500 (!) 96/59 -- -- 66 14 98 % -- --  01/20/23 0415 (!) 96/54 -- -- 78 14 99 % -- --  01/20/23 0400 (!) 95/56 -- -- 83 20 97 % -- --  01/20/23 0345 98/63 97.9 F (36.6 C) Oral 73 19 96 % -- --  01/20/23 0330 (!) 88/60 -- -- 65 14 99 % -- --  01/20/23 0315 (!) 86/64 -- -- 76 13 94 % -- --  01/20/23 0300 (!) 86/59 -- -- 74 (!) 23 96 % -- --  01/20/23 0245 92/66 -- -- 73 15 96 % -- --  01/20/23 0230 (!) 90/56 -- -- 68 14 97 % -- --  01/20/23 0215 (!) 88/60 -- -- 71 12 97 % -- --  01/20/23 0210 -- -- -- -- -- -- -- 151 lb 7.3 oz (68.7 kg)  01/20/23 0200 99/63 -- -- 72 15 94 % -- --  01/20/23 0145 (!) 94/58 -- --  60 14 96 % -- --  01/20/23 0130 (!) 85/57 -- -- 67 18 94 % -- --  01/20/23 0115 91/60 -- -- 69 14 94 % -- --  01/20/23 0100 99/60 -- -- 75 20 93 % -- --  01/20/23 0000 94/60 98.6 F (37 C) Oral 71 19 94 % -- --  01/19/23 2345 (!) 94/59 -- -- 70 16 93 % -- --  01/19/23 2330 (!) 93/57 -- -- 70 20 (!) 86 % -- --  01/19/23 2315 -- -- -- 74 (!) 21 95 % -- --  01/19/23 2300 104/63 -- -- 73 (!) 22 95 % -- --  01/19/23 2258 99/64 98.4 F (36.9 C) Oral 72 (!) 22 -- 5\' 3"  (1.6 m) 151 lb 7.3 oz (68.7 kg)  01/19/23 2245 98/62 -- -- 74 16 93 % -- --  01/19/23 2240 99/64 -- -- 72 (!) 22 91 % -- --  01/19/23 2235 90/64 -- -- 73 (!) 21 90 % -- --  01/19/23 2230 (!) 95/59 -- -- 70 (!) 22 90 % -- --  01/19/23 2225 92/65 -- -- 71 (!) 22 94 % -- --  01/19/23 2220 (!) 86/46 -- -- 72 18 98 % -- --  01/19/23 2215 (!) 95/52 -- -- 63 15 100 % -- --  01/19/23 2210 (!) 89/60 -- -- 69 18 91 % -- --  01/19/23 2205 (!) 91/55 -- -- 77 (!) 21 92 % -- --  01/19/23 2200 (!) 87/60 -- -- 69 (!) 23 90 % -- --  01/19/23 2155 (!) 90/57 -- -- 71 (!) 25  95 % -- --  01/19/23 2150 105/63 -- -- 78 (!) 22 93 % -- --  01/19/23 2145 (!) 104/58 -- -- 71 19 92 % -- --  01/19/23 2140 (!) 89/65 -- -- 70 (!) 24 94 % -- --  01/19/23 2135 98/62 -- -- 75 17 92 % -- --  01/19/23 2130 95/61 -- -- 73 (!) 22 91 % -- --  01/19/23 2125 (!) 84/71 -- -- 65 18 93 % -- --  01/19/23 2120 -- -- -- 76 19 (!) 88 % -- --  01/19/23 2115 (!) 96/54 -- -- 74 17 91 % -- --  01/19/23 2110 -- -- -- 72 18 90 % -- --  01/19/23 2105 (!) 100/57 98.4 F (36.9 C) Oral 77 14 96 % -- --  01/19/23 2100 -- 98.4 F (36.9 C) Oral -- -- -- -- 151 lb 7.3 oz (68.7 kg)    Physical Exam Constitutional:      Appearance: Normal appearance.  Cardiovascular:     Rate and Rhythm: Normal rate and regular rhythm.     Pulses: Normal pulses.     Heart sounds: Normal heart sounds.  Pulmonary:     Effort: Pulmonary effort is normal.     Breath sounds: Normal breath sounds.   Abdominal:     Palpations: There is mass (Splenomegaly noted).  Musculoskeletal:     Cervical back: Normal range of motion and neck supple. No rigidity.  Lymphadenopathy:     Cervical: No cervical adenopathy.  Neurological:     Mental Status: She is alert.       Lab Results  Component Value Date   WBC 41.6 (H) 01/20/2023   HGB 11.1 (L) 01/20/2023   HCT 35.0 (L) 01/20/2023   PLT 1,097 (HH) 01/20/2023   GLUCOSE 134 (H) 01/20/2023   CHOL 154 01/20/2023   TRIG 60 01/20/2023   HDL 53 01/20/2023   LDLCALC 89 01/20/2023   ALT 23 01/19/2023   AST 29 01/19/2023   NA 139 01/20/2023   K 3.8 01/20/2023   CL 107 01/20/2023   CREATININE 1.50 (H) 01/20/2023   BUN 15 01/20/2023   CO2 23 01/20/2023    CT Angio Chest/Abd/Pel for Dissection W and/or Wo Contrast  Addendum Date: 01/19/2023   ADDENDUM REPORT: 01/19/2023 16:57 ADDENDUM: On further review, the origin of intramural hemorrhage is visible, especially on the sagittal reconstructions with a focal ulceration along the superior aspect of the aortic arch located approximately 1.3 cm distal to the left subclavian origin. This likely is the origin of the intramural hemorrhage which then progressed distally in the thoracic aorta and into the proximal abdominal aorta. Electronically Signed   By: Irish Lack M.D.   On: 01/19/2023 16:57   Result Date: 01/19/2023 CLINICAL DATA:  Suggestion of possible aortic dissection/intramural hemorrhage of the aorta on CT of the abdomen and pelvis earlier today. EXAM: CT ANGIOGRAPHY CHEST, ABDOMEN AND PELVIS TECHNIQUE: Non-contrast CT of the chest was initially obtained. Multidetector CT imaging through the chest, abdomen and pelvis was performed using the standard protocol during bolus administration of intravenous contrast. Multiplanar reconstructed images and MIPs were obtained and reviewed to evaluate the vascular anatomy. RADIATION DOSE REDUCTION: This exam was performed according to the departmental  dose-optimization program which includes automated exposure control, adjustment of the mA and/or kV according to patient size and/or use of iterative reconstruction technique. CONTRAST:  75mL OMNIPAQUE IOHEXOL 350 MG/ML SOLN COMPARISON:  CT of the abdomen and pelvis earlier today.  FINDINGS: CTA CHEST FINDINGS Cardiovascular: There is aneurysmal disease of the thoracic aorta beginning at the level of the distal arch and continuing into the descending thoracic aorta. Component of acute intramural hemorrhage is suspected beginning at roughly the aortic isthmus and continuing into the descending thoracic aorta. Maximum caliber of the distal arch including the estimated component of intramural hemorrhage is 3.9 cm. Maximum caliber of the descending thoracic aorta including intramural hemorrhage is approximately 3.7 cm. There also is a component additional adjacent crescentic fluid along the left lateral aspect of the descending thoracic aorta which appears to be separate from the intramural hemorrhage. The presence of some crescentic fluid could be related to some extraluminal bleeding but is not large. No overt intimal flap is present within the opacified lumen of the thoracic aorta. The intramural hemorrhage does not appear to affect the ascending thoracic aorta or great vessel origins at this time. The heart is substantially enlarged. There is a small amount of pericardial fluid. No significant calcified coronary artery plaque. Central pulmonary arteries are dilated with the main pulmonary artery measuring up to 3.4 cm. Mediastinum/Nodes: No enlarged mediastinal, hilar, or axillary lymph nodes. Thyroid gland, trachea, and esophagus demonstrate no significant findings. Lungs/Pleura: Trace left pleural fluid. Left lower lung atelectasis. No overt pulmonary edema. No pneumothorax. Musculoskeletal: No chest wall abnormality. No acute or significant osseous findings. Review of the MIP images confirms the above findings.  CTA ABDOMEN AND PELVIS FINDINGS VASCULAR Aorta: As suspected on the abdominal CT, the intramural hemorrhage of the descending thoracic aorta likely continues into the proximal abdominal aorta but likely ends before origins of the renal arteries. No intimal flap is noted in the abdominal aorta. The abdominal aorta is not aneurysmal. Celiac: Normally patent.  Normally patent branch vessels. SMA: Normally patent. Renals: Normally patent single left renal artery. There are 3 separate normally patent right renal arteries. IMA: Normally patent. Inflow: Normally patent bilateral iliac arteries without aneurysm, stenosis or dissection. Review of the MIP images confirms the above findings. NON-VASCULAR Hepatobiliary: Stable appearance of liver and gallbladder. No biliary ductal dilatation. Pancreas: Unremarkable. No pancreatic ductal dilatation or surrounding inflammatory changes. Spleen: Stable splenic enlargement. The previously noted anterior splenic lesion is not well appreciated on the arterial phase. 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 Bladder contains excreted contrast related to the prior CT. Stomach/Bowel: No bowel obstruction, inflammation or free intraperitoneal air. Lymphatic: No enlarged lymph nodes identified. Reproductive: Enlarged uterus demonstrating degenerated and calcified fibroids. Other: Stable small ventral hernia. Body wall anasarca without significant ascites or focal abnormal fluid collection. Musculoskeletal: No acute or significant osseous findings. Review of the MIP images confirms the above findings. IMPRESSION: 1. 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 Component of acute intramural hemorrhage is suspected beginning at roughly the aortic isthmus just beyond the left subclavian artery origin and continuing into the descending thoracic aorta.  Maximum caliber of the distal arch including the estimated component of intramural hemorrhage is 3.9 cm. Maximum caliber of the descending thoracic aorta including intramural hemorrhage is approximately 3.7 cm. There also is a component of additional adjacent crescentic fluid along the left lateral aspect of the descending thoracic aorta which appears to be separate from the intramural hemorrhage. The presence of some crescentic fluid could be related to some extraluminal bleeding but is not large. This could also be related to reactive fluid. No overt intimal flap is  present within the opacified lumen of the thoracic aorta. The intramural hemorrhage does not appear to affect the ascending thoracic aorta or great vessel origins at this time. 2. As suspected on the abdominal CT, the intramural hemorrhage of the descending thoracic aorta likely continues into the proximal abdominal aorta but likely ends before origins of the renal arteries. Intramural hemorrhage does not compromise patency of the celiac axis or SMA. 3. Substantial cardiomegaly with small amount of pericardial fluid. 4. Dilated central pulmonary arteries suggesting pulmonary arterial hypertension. 5. Trace left pleural fluid with left lower lung atelectasis. 6. Stable splenic enlargement. The previously noted anterior splenic lesion is not well appreciated on the arterial phase. 7. 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 8. Enlarged uterus demonstrating degenerated and calcified fibroids. 9. Stable small ventral hernia. 10. Body wall anasarca without significant ascites or focal abnormal fluid collection. 11. These results were called by telephone at the time of interpretation on 01/19/2023 at 4:50PM to provider Dr. Fuller Plan, who verbally acknowledged these results. Electronically Signed: By: Irish Lack M.D. On: 01/19/2023 16:50   US Venous Img Lower Bilateral  Result Date: 01/19/2023 CLINICAL DATA:  77 year old  female with bilateral lower extremity edema. EXAM: BILATERAL LOWER EXTREMITY VENOUS DOPPLER ULTRASOUND TECHNIQUE: Gray-scale sonography with graded compression, as well as color Doppler and duplex ultrasound were performed to evaluate the lower extremity deep venous systems from the level of the common femoral vein and including the common femoral, femoral, profunda femoral, popliteal and calf veins including the posterior tibial, peroneal and gastrocnemius veins when visible. The superficial great saphenous vein was also interrogated. Spectral Doppler was utilized to evaluate flow at rest and with distal augmentation maneuvers in the common femoral, femoral and popliteal veins. COMPARISON:  None Available. FINDINGS: RIGHT LOWER EXTREMITY Common Femoral Vein: No evidence of thrombus. Normal compressibility, respiratory phasicity and response to augmentation. Saphenofemoral Junction: No evidence of thrombus. Normal compressibility and flow on color Doppler imaging. Profunda Femoral Vein: No evidence of thrombus. Normal compressibility and flow on color Doppler imaging. Femoral Vein: No evidence of thrombus. Normal compressibility, respiratory phasicity and response to augmentation. Popliteal Vein: No evidence of thrombus. Normal compressibility, respiratory phasicity and response to augmentation. Calf Veins: No evidence of thrombus. Normal compressibility and flow on color Doppler imaging. Other Findings:  None. LEFT LOWER EXTREMITY Common Femoral Vein: No evidence of thrombus. Normal compressibility, respiratory phasicity and response to augmentation. Saphenofemoral Junction: No evidence of thrombus. Normal compressibility and flow on color Doppler imaging. Profunda Femoral Vein: No evidence of thrombus. Normal compressibility and flow on color Doppler imaging. Femoral Vein: No evidence of thrombus. Normal compressibility, respiratory phasicity and response to augmentation. Popliteal Vein: No evidence of thrombus.  Normal compressibility, respiratory phasicity and response to augmentation. Calf Veins: No evidence of thrombus. Normal compressibility and flow on color Doppler imaging. Other Findings:  None. IMPRESSION: No evidence of bilateral lower extremity deep venous thrombosis. Marliss Coots, MD [facility] Electronically Signed   By: Marliss Coots M.D.   On: 01/19/2023 14:58   CT ABDOMEN PELVIS W CONTRAST  Addendum Date: 01/19/2023   ADDENDUM REPORT: 01/19/2023 14:55 ADDENDUM: Critical Value/emergent results were called by telephone at the time of interpretation on 01/19/2023 at 2:55 pm to provider DAVID WELLS , who verbally acknowledged these results. Electronically Signed   By: Jules Schick M.D.   On: 01/19/2023 14:55   Result Date: 01/19/2023 CLINICAL DATA:  Generalized abdominal tenderness to palpation. Prior history of  hernia. Concern for obstruction EXAM: CT ABDOMEN AND PELVIS WITH CONTRAST TECHNIQUE: Multidetector CT imaging of the abdomen and pelvis was performed using the standard protocol following bolus administration of intravenous contrast. RADIATION DOSE REDUCTION: This exam was performed according to the departmental dose-optimization program which includes automated exposure control, adjustment of the mA and/or kV according to patient size and/or use of iterative reconstruction technique. CONTRAST:  OMNIPAQUE IOHEXOL 300 MG/ML  SOLN COMPARISON:  None Available. FINDINGS: Lower chest: There is small left and trace right pleural effusion. There are associated atelectatic changes at the lung bases. No consolidation or lung mass. There is mild-to-moderate cardiomegaly. Partially seen at least mild pericardial effusion. Hepatobiliary: The liver is normal in size. Non-cirrhotic configuration. There is heterogeneous attenuation of the liver, predominantly right hepatic lobe, which may be due to heterogeneous hepatic steatosis. No discrete  suspicious mass seen within the limitations of this exam. No intrahepatic or extrahepatic bile duct dilation. No calcified gallstones. Normal gallbladder wall thickness. No pericholecystic inflammatory changes. Pancreas: Unremarkable. No pancreatic ductal dilatation or surrounding inflammatory changes. Spleen: There is a well-circumscribed heterogeneous, mixed hypoattenuating and hyperattenuating 4.9 x 5.9 cm lesion in the anterior aspect of the spleen. This is incompletely characterized on the current exam. Adrenals/Urinary Tract: Adrenal glands are unremarkable. Malrotated bilateral kidneys noted. There are hypoattenuating areas in the left kidney lower pole, reaching up to the cortical surface. The differential diagnosis includes renal infarction versus pyelonephritis. There are at least 4, subcentimeter sized nonobstructing calculi in the right kidney. No nephroureterolithiasis on the left side. No hydroureteronephrosis on either side. Note is made of bilateral extrarenal pelvis. Unremarkable urinary bladder. Stomach/Bowel: No disproportionate dilation of the small or large bowel loops. No evidence of abnormal bowel wall thickening or inflammatory changes. The appendix is unremarkable. Vascular/Lymphatic: There is small ascites no pneumoperitoneum. No abdominal or pelvic lymphadenopathy, by size criteria. No aneurysmal dilation of the major abdominal arteries. There are mild peripheral atherosclerotic vascular calcifications of the aorta and its major branches. There is crescentic filling defect along the left/posterior wall of the lower thoracic aorta extending inferiorly into the abdomen up to the level of origin of the inferior mesenteric artery. This is nonspecific and differential diagnosis includes thrombosed false lumen in aortic dissection, intramural hematoma or intraluminal thrombus/hematoma, etc. However, there is probable focal dissection flap in the proximal left renal artery (series 3, image 25),  favoring aortic dissection. Further imaging of chest with dissection protocol (before and after intravenous contrast) is recommended. There is asymmetric dilated and tortuous left-sided parametrial/gonadal veins, which is nonspecific but in appropriate clinical setting can be seen with pelvic congestion syndrome. Reproductive: Enlarged bulky uterus containing multiple hyperattenuating and calcified masses, favored to represent leiomyomas. No large adnexal mass seen. Other: There is small right paramedian ventral hernia containing portion of fat and ascitic fluid. No herniation of bowel loops. The soft tissues and abdominal wall are otherwise unremarkable. Musculoskeletal: No suspicious osseous lesions. There are mild - moderate multilevel degenerative changes in the visualized spine. IMPRESSION: 1. There is crescentic filling defect along the left/posterior wall of the lower thoracic aorta extending inferiorly into the abdomen up to the level of origin of inferior mesenteric artery. There is probable dissection flap extending into the proximal left renal artery which exhibit hypoattenuating area in the lower pole, favored to represent renal infarction. 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 Further imaging with CT angiography chest, abdomen and pelvis as per dissection protocol is recommended.  2. There is a small right paramedian ventral hernia containing portion of fat and ascitic fluid. No herniation of bowel loops. No bowel obstruction. 3. Small left pleural effusion and partially seen pericardial effusion. 4. There is a heterogeneous approximately 4.9 x 5.9 cm lesion in the anterior spleen, which is incompletely characterized on the current exam. 5. Multiple other nonacute observations, as described above. Electronically Signed: By: Jules Schick M.D. On: 01/19/2023 14:47   DG Chest Port 1 View  Result Date: 01/19/2023 CLINICAL DATA:  Chest pain.   Weakness. EXAM: PORTABLE CHEST 1 VIEW COMPARISON:  None Available. FINDINGS: There is diffuse pulmonary vascular congestion. There are left retrocardiac opacities, which may represent combination of atelectasis and/or consolidation. Bilateral lung fields are otherwise clear. No dense consolidation or lung collapse. Apparent blunting of left lateral costophrenic angle may represent pleural effusion versus superimposed soft tissues. Right lateral costophrenic angle is clear. Moderately enlarged cardio-mediastinal silhouette. No acute osseous abnormalities. The soft tissues are within normal limits. IMPRESSION: *Findings favor congestive heart failure/pulmonary edema. Electronically Signed   By: Jules Schick M.D.   On: 01/19/2023 14:20    Pathology:JAK 2 pos ET  CT Angio Chest/Abd/Pel for Dissection W and/or Wo Contrast  Addendum Date: 01/19/2023   ADDENDUM REPORT: 01/19/2023 16:57 ADDENDUM: On further review, the origin of intramural hemorrhage is visible, especially on the sagittal reconstructions with a focal ulceration along the superior aspect of the aortic arch located approximately 1.3 cm distal to the left subclavian origin. This likely is the origin of the intramural hemorrhage which then progressed distally in the thoracic aorta and into the proximal abdominal aorta. Electronically Signed   By: Irish Lack M.D.   On: 01/19/2023 16:57   Result Date: 01/19/2023 CLINICAL DATA:  Suggestion of possible aortic dissection/intramural hemorrhage of the aorta on CT of the abdomen and pelvis earlier today. EXAM: CT ANGIOGRAPHY CHEST, ABDOMEN AND PELVIS TECHNIQUE: Non-contrast CT of the chest was initially obtained. Multidetector CT imaging through the chest, abdomen and pelvis was performed using the standard protocol during bolus administration of intravenous contrast. Multiplanar reconstructed images and MIPs were obtained and reviewed to evaluate the vascular anatomy. RADIATION DOSE REDUCTION: This  exam was performed according to the departmental dose-optimization program which includes automated exposure control, adjustment of the mA and/or kV according to patient size and/or use of iterative reconstruction technique. CONTRAST:  75mL OMNIPAQUE IOHEXOL 350 MG/ML SOLN COMPARISON:  CT of the abdomen and pelvis earlier today. FINDINGS: CTA CHEST FINDINGS Cardiovascular: There is aneurysmal disease of the thoracic aorta beginning at the level of the distal arch and continuing into the descending thoracic aorta. Component of acute intramural hemorrhage is suspected beginning at roughly the aortic isthmus and continuing into the descending thoracic aorta. Maximum caliber of the distal arch including the estimated component of intramural hemorrhage is 3.9 cm. Maximum caliber of the descending thoracic aorta including intramural hemorrhage is approximately 3.7 cm. There also is a component additional adjacent crescentic fluid along the left lateral aspect of the descending thoracic aorta which appears to be separate from the intramural hemorrhage. The presence of some crescentic fluid could be related to some extraluminal bleeding but is not large. No overt intimal flap is present within the opacified lumen of the thoracic aorta. The intramural hemorrhage does not appear to affect the ascending thoracic aorta or great vessel origins at this time. The heart is substantially enlarged. There is a small amount of pericardial fluid. No significant calcified coronary artery plaque. Central  pulmonary arteries are dilated with the main pulmonary artery measuring up to 3.4 cm. Mediastinum/Nodes: No enlarged mediastinal, hilar, or axillary lymph nodes. Thyroid gland, trachea, and esophagus demonstrate no significant findings. Lungs/Pleura: Trace left pleural fluid. Left lower lung atelectasis. No overt pulmonary edema. No pneumothorax. Musculoskeletal: No chest wall abnormality. No acute or significant osseous findings. Review  of the MIP images confirms the above findings. CTA ABDOMEN AND PELVIS FINDINGS VASCULAR Aorta: As suspected on the abdominal CT, the intramural hemorrhage of the descending thoracic aorta likely continues into the proximal abdominal aorta but likely ends before origins of the renal arteries. No intimal flap is noted in the abdominal aorta. The abdominal aorta is not aneurysmal. Celiac: Normally patent.  Normally patent branch vessels. SMA: Normally patent. Renals: Normally patent single left renal artery. There are 3 separate normally patent right renal arteries. IMA: Normally patent. Inflow: Normally patent bilateral iliac arteries without aneurysm, stenosis or dissection. Review of the MIP images confirms the above findings. NON-VASCULAR Hepatobiliary: Stable appearance of liver and gallbladder. No biliary ductal dilatation. Pancreas: Unremarkable. No pancreatic ductal dilatation or surrounding inflammatory changes. Spleen: Stable splenic enlargement. The previously noted anterior splenic lesion is not well appreciated on the arterial phase. 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 Bladder contains excreted contrast related to the prior CT. Stomach/Bowel: No bowel obstruction, inflammation or free intraperitoneal air. Lymphatic: No enlarged lymph nodes identified. Reproductive: Enlarged uterus demonstrating degenerated and calcified fibroids. Other: Stable small ventral hernia. Body wall anasarca without significant ascites or focal abnormal fluid collection. Musculoskeletal: No acute or significant osseous findings. Review of the MIP images confirms the above findings. IMPRESSION: 1. 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 Component of acute intramural hemorrhage is suspected beginning at roughly the aortic isthmus just beyond the left subclavian artery origin and  continuing into the descending thoracic aorta. Maximum caliber of the distal arch including the estimated component of intramural hemorrhage is 3.9 cm. Maximum caliber of the descending thoracic aorta including intramural hemorrhage is approximately 3.7 cm. There also is a component of additional adjacent crescentic fluid along the left lateral aspect of the descending thoracic aorta which appears to be separate from the intramural hemorrhage. The presence of some crescentic fluid could be related to some extraluminal bleeding but is not large. This could also be related to reactive fluid. No overt intimal flap is present within the opacified lumen of the thoracic aorta. The intramural hemorrhage does not appear to affect the ascending thoracic aorta or great vessel origins at this time. 2. As suspected on the abdominal CT, the intramural hemorrhage of the descending thoracic aorta likely continues into the proximal abdominal aorta but likely ends before origins of the renal arteries. Intramural hemorrhage does not compromise patency of the celiac axis or SMA. 3. Substantial cardiomegaly with small amount of pericardial fluid. 4. Dilated central pulmonary arteries suggesting pulmonary arterial hypertension. 5. Trace left pleural fluid with left lower lung atelectasis. 6. Stable splenic enlargement. The previously noted anterior splenic lesion is not well appreciated on the arterial phase. 7. 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 8. Enlarged uterus demonstrating degenerated and calcified fibroids. 9. Stable small ventral hernia. 10. Body wall anasarca without significant ascites or focal abnormal fluid collection. 11. These results were called by telephone at the time of interpretation on 01/19/2023 at 4:50PM to provider Dr. Fuller Plan, who verbally acknowledged these results.  Electronically Signed: By: Irish Lack M.D. On: 01/19/2023 16:50   US Venous Img Lower Bilateral  Result  Date: 01/19/2023 CLINICAL DATA:  77 year old female with bilateral lower extremity edema. EXAM: BILATERAL LOWER EXTREMITY VENOUS DOPPLER ULTRASOUND TECHNIQUE: Gray-scale sonography with graded compression, as well as color Doppler and duplex ultrasound were performed to evaluate the lower extremity deep venous systems from the level of the common femoral vein and including the common femoral, femoral, profunda femoral, popliteal and calf veins including the posterior tibial, peroneal and gastrocnemius veins when visible. The superficial great saphenous vein was also interrogated. Spectral Doppler was utilized to evaluate flow at rest and with distal augmentation maneuvers in the common femoral, femoral and popliteal veins. COMPARISON:  None Available. FINDINGS: RIGHT LOWER EXTREMITY Common Femoral Vein: No evidence of thrombus. Normal compressibility, respiratory phasicity and response to augmentation. Saphenofemoral Junction: No evidence of thrombus. Normal compressibility and flow on color Doppler imaging. Profunda Femoral Vein: No evidence of thrombus. Normal compressibility and flow on color Doppler imaging. Femoral Vein: No evidence of thrombus. Normal compressibility, respiratory phasicity and response to augmentation. Popliteal Vein: No evidence of thrombus. Normal compressibility, respiratory phasicity and response to augmentation. Calf Veins: No evidence of thrombus. Normal compressibility and flow on color Doppler imaging. Other Findings:  None. LEFT LOWER EXTREMITY Common Femoral Vein: No evidence of thrombus. Normal compressibility, respiratory phasicity and response to augmentation. Saphenofemoral Junction: No evidence of thrombus. Normal compressibility and flow on color Doppler imaging. Profunda Femoral Vein: No evidence of thrombus. Normal compressibility and flow on color Doppler imaging. Femoral Vein: No evidence of thrombus. Normal compressibility, respiratory phasicity and response to  augmentation. Popliteal Vein: No evidence of thrombus. Normal compressibility, respiratory phasicity and response to augmentation. Calf Veins: No evidence of thrombus. Normal compressibility and flow on color Doppler imaging. Other Findings:  None. IMPRESSION: No evidence of bilateral lower extremity deep venous thrombosis. Marliss Coots, MD [facility] Electronically Signed   By: Marliss Coots M.D.   On: 01/19/2023 14:58   CT ABDOMEN PELVIS W CONTRAST  Addendum Date: 01/19/2023   ADDENDUM REPORT: 01/19/2023 14:55 ADDENDUM: Critical Value/emergent results were called by telephone at the time of interpretation on 01/19/2023 at 2:55 pm to provider DAVID WELLS , who verbally acknowledged these results. Electronically Signed   By: Jules Schick M.D.   On: 01/19/2023 14:55   Result Date: 01/19/2023 CLINICAL DATA:  Generalized abdominal tenderness to palpation. Prior history of hernia. Concern for obstruction EXAM: CT ABDOMEN AND PELVIS WITH CONTRAST TECHNIQUE: Multidetector CT imaging of the abdomen and pelvis was performed using the standard protocol following bolus administration of intravenous contrast. RADIATION DOSE REDUCTION: This exam was performed according to the departmental dose-optimization program which includes automated exposure control, adjustment of the mA and/or kV according to patient size and/or use of iterative reconstruction technique. CONTRAST:  OMNIPAQUE IOHEXOL 300 MG/ML  SOLN COMPARISON:  None Available. FINDINGS: Lower chest: There is small left and trace right pleural effusion. There are associated atelectatic changes at the lung bases. No consolidation or lung mass. There is mild-to-moderate cardiomegaly. Partially seen at least mild pericardial effusion. Hepatobiliary: The liver is normal in size. Non-cirrhotic configuration. There is heterogeneous attenuation of the liver, predominantly right hepatic lobe, which may be  due to heterogeneous hepatic steatosis. No discrete suspicious mass seen within the limitations of this exam. No intrahepatic or extrahepatic bile duct dilation. No calcified gallstones. Normal gallbladder wall thickness. No pericholecystic inflammatory changes. Pancreas: Unremarkable.  No pancreatic ductal dilatation or surrounding inflammatory changes. Spleen: There is a well-circumscribed heterogeneous, mixed hypoattenuating and hyperattenuating 4.9 x 5.9 cm lesion in the anterior aspect of the spleen. This is incompletely characterized on the current exam. Adrenals/Urinary Tract: Adrenal glands are unremarkable. Malrotated bilateral kidneys noted. There are hypoattenuating areas in the left kidney lower pole, reaching up to the cortical surface. The differential diagnosis includes renal infarction versus pyelonephritis. There are at least 4, subcentimeter sized nonobstructing calculi in the right kidney. No nephroureterolithiasis on the left side. No hydroureteronephrosis on either side. Note is made of bilateral extrarenal pelvis. Unremarkable urinary bladder. Stomach/Bowel: No disproportionate dilation of the small or large bowel loops. No evidence of abnormal bowel wall thickening or inflammatory changes. The appendix is unremarkable. Vascular/Lymphatic: There is small ascites no pneumoperitoneum. No abdominal or pelvic lymphadenopathy, by size criteria. No aneurysmal dilation of the major abdominal arteries. There are mild peripheral atherosclerotic vascular calcifications of the aorta and its major branches. There is crescentic filling defect along the left/posterior wall of the lower thoracic aorta extending inferiorly into the abdomen up to the level of origin of the inferior mesenteric artery. This is nonspecific and differential diagnosis includes thrombosed false lumen in aortic dissection, intramural hematoma or intraluminal thrombus/hematoma, etc. However, there is probable focal dissection flap in  the proximal left renal artery (series 3, image 25), favoring aortic dissection. Further imaging of chest with dissection protocol (before and after intravenous contrast) is recommended. There is asymmetric dilated and tortuous left-sided parametrial/gonadal veins, which is nonspecific but in appropriate clinical setting can be seen with pelvic congestion syndrome. Reproductive: Enlarged bulky uterus containing multiple hyperattenuating and calcified masses, favored to represent leiomyomas. No large adnexal mass seen. Other: There is small right paramedian ventral hernia containing portion of fat and ascitic fluid. No herniation of bowel loops. The soft tissues and abdominal wall are otherwise unremarkable. Musculoskeletal: No suspicious osseous lesions. There are mild - moderate multilevel degenerative changes in the visualized spine. IMPRESSION: 1. There is crescentic filling defect along the left/posterior wall of the lower thoracic aorta extending inferiorly into the abdomen up to the level of origin of inferior mesenteric artery. There is probable dissection flap extending into the proximal left renal artery which exhibit hypoattenuating area in the lower pole, favored to represent renal infarction. 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 Further imaging with CT angiography chest, abdomen and pelvis as per dissection protocol is recommended. 2. There is a small right paramedian ventral hernia containing portion of fat and ascitic fluid. No herniation of bowel loops. No bowel obstruction. 3. Small left pleural effusion and partially seen pericardial effusion. 4. There is a heterogeneous approximately 4.9 x 5.9 cm lesion in the anterior spleen, which is incompletely characterized on the current exam. 5. Multiple other nonacute observations, as described above. Electronically Signed: By: Jules Schick M.D. On: 01/19/2023 14:47   DG Chest Port 1  View  Result Date: 01/19/2023 CLINICAL DATA:  Chest pain.  Weakness. EXAM: PORTABLE CHEST 1 VIEW COMPARISON:  None Available. FINDINGS: There is diffuse pulmonary vascular congestion. There are left retrocardiac opacities, which may represent combination of atelectasis and/or consolidation. Bilateral lung fields are otherwise clear. No dense consolidation or lung collapse. Apparent blunting of left lateral costophrenic angle may represent pleural effusion versus superimposed soft tissues. Right lateral costophrenic angle is clear. Moderately enlarged cardio-mediastinal silhouette. No acute osseous abnormalities. The soft tissues are within normal limits. IMPRESSION: *Findings favor congestive heart  failure/pulmonary edema. Electronically Signed   By: Jules Schick M.D.   On: 01/19/2023 14:20

## 2023-01-20 NOTE — Consult Note (Signed)
Hospital Consult    Reason for Consult: Aortic intramural hematoma Requesting Physician: Dr. Chestine Spore MRN #:  [id number]  History of Present Illness: This is a 77 y.o. female with history of hypertension A-fib, essential thrombocytopenia who presented with intermittent chest pain, abdominal pain, lower extremity pain.  Imaging at outside hospital demonstrated aortic intramural hematomas beginning at zone 3, and traveling to zone 5.  Vascular surgery was called for further recommendations  On exam, Alegre was doing well.  She denied back pain, abdominal pain, chest pain.  A native of [facility], she continues to live with her husband, and has 3 grown children.  She has never had the pain she appreciated earlier.  Denied fevers, chills. Denies claudication, ischemic rest pain, tissue loss.  Overall feels much better than previous.  Past Medical History:  Diagnosis Date   Embolic stroke (HCC)    a. 09/2019 MRI: several punctate acute infarcts in R parietal lobe affecting the cortical and subcortical brain consistent w/ micro embolic infarcts in R MCA branch vessel distribution.   Hyperlipidemia    Hypertension    Persistent atrial fibrillation (HCC)    a. 09/2019 Dx in setting of ED visit for weakness-->embolic stroke; b. CHA2DS2VASc = 6-->eliquis; c. 09/2019 Echo: EF 50-55%, no rwma, mild LVH. Nl RV size/fxn. Mild LAE, mod RAE. Mild to mod MR.   Thrombocytosis     History reviewed. No pertinent surgical history.  No Known Allergies  Prior to Admission medications   Not on File    Social History   Socioeconomic History   Marital status: Married    Spouse name: Nashya Holbert   Number of children: 3   Years of education: Not on file   Highest education level: Not on file  Occupational History   Not on file  Tobacco Use   Smoking status: Never   Smokeless tobacco: Never  Vaping Use   Vaping status: Never Used  Substance and Sexual Activity   Alcohol use: Never   Drug use:  Never   Sexual activity: Never  Other Topics Concern   Not on file  Social History Narrative   Never smoked; no alcohol; used to [employer]; 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    Social Determinants of Health   Financial Resource Strain: Not on file  Food Insecurity: No Food Insecurity (01/19/2023)   Hunger Vital Sign    Worried About Running Out of Food in the Last Year: Never true    Ran Out of Food in the Last Year: Never true  Transportation Needs: No Transportation Needs (01/19/2023)   PRAPARE - Administrator, Civil Service (Medical): No    Lack of Transportation (Non-Medical): No  Physical Activity: Not on file  Stress: Not on file  Social Connections: Not on file  Intimate Partner Violence: Not At Risk (01/19/2023)   Humiliation, Afraid, Rape, and Kick questionnaire    Fear of Current or Ex-Partner: No    Emotionally Abused: No    Physically Abused: No    Sexually Abused: No    Family History  Problem Relation Age of Onset   High blood pressure Mother    Hypertension Brother     ROS: Otherwise negative unless mentioned in HPI  Physical Examination  Vitals:   01/19/23 2345 01/20/23 0000  BP: (!) 94/59 94/60  Pulse: 70 71  Resp: 16 19  Temp:  98.6 F (37 C)  SpO2: 93% 94%   Body mass index is 26.83 kg/m.  General:  WDWN in NAD Gait: Not observed HENT: WNL, normocephalic Pulmonary: normal non-labored breathing, without Rales, rhonchi,  wheezing Cardiac: regular -antihypertensive gtt Abdomen:  soft, NT/ND, no masses Skin: without rashes Vascular Exam/Pulses: Palpable femoral pulses bilaterally, palpable dorsalis pedis pulses ( left stronger than right) Extremities: without ischemic changes, without Gangrene , without cellulitis; without open wounds;  Musculoskeletal: no muscle wasting or atrophy  Neurologic: A&O X 3;  No focal weakness or paresthesias are detected; speech is fluent/normal Psychiatric:  The pt has Normal affect. Lymph:   Unremarkable  CBC    Component Value Date/Time   WBC 42.0 (H) 01/19/2023 2229   RBC 3.94 01/19/2023 2229   HGB 11.8 (L) 01/19/2023 2229   HCT 37.5 01/19/2023 2229   PLT 1,184 (HH) 01/19/2023 2229   MCV 95.2 01/19/2023 2229   MCH 29.9 01/19/2023 2229   MCHC 31.5 01/19/2023 2229   RDW 16.0 (H) 01/19/2023 2229   LYMPHSABS 0.4 (L) 01/19/2023 2229   MONOABS 1.3 (H) 01/19/2023 2229   EOSABS 0.1 01/19/2023 1125   BASOSABS 0.2 (H) 01/19/2023 1125    BMET    Component Value Date/Time   NA 140 01/19/2023 2229   K 3.9 01/19/2023 2229   CL 109 01/19/2023 2229   CO2 21 (L) 01/19/2023 2229   GLUCOSE 120 (H) 01/19/2023 2229   BUN 15 01/19/2023 2229   CREATININE 1.41 (H) 01/19/2023 2229   CALCIUM 9.3 01/19/2023 2229   GFRNONAA 38 (L) 01/19/2023 2229   GFRAA >60 10/31/2019 0920    COAGS: Lab Results  Component Value Date   INR 1.6 (H) 01/19/2023   INR 1.4 (H) 01/19/2023     ASSESSMENT/PLAN: This is a 77 y.o. female with zone 3 through zone 5 aortic intramural hematoma.  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  While the intramural hematoma is quite large, she is asymptomatic.  She denies back pain, chest pain, abdominal pain. I do not have a good etiology as to why she has a leukocytosis of 42.  She is not septic.  This could be an undiagnosed malignancy. Primary infection of the aorta is rare, and usually presents as a pseudoaneurysm.  At this time, Lenee would be best treated with continued medical management. Will continue to follow.  Parameters - HR <80, SBP<120 Recommend esmolol and nitro  Will rescan at 48 hours. Please call should questions or concerns arise.     Victorino Sparrow MD MS [facility] [phone] 01/20/2023  12:37 AM

## 2023-01-20 NOTE — Progress Notes (Signed)
  Daily Progress Note  [facility]  Subjective: Fells good this morning. No pain today   Objective: Vitals:   01/20/23 0700 01/20/23 0800  BP: 96/66 90/63  Pulse: 79 75  Resp: 16 15  Temp:    SpO2: 95% 97%    Physical Examination Abdomen soft Regular rate Normal rhythm Palpable femoral arteries 1+ rt DP, 2+ Lf DP  ASSESSMENT/PLAN:  This is a 76 y.o. female with zone 3 through zone 5 aortic intramural hematoma.  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  While the intramural hematoma is quite large, she is asymptomatic.  She denies back pain, chest pain, abdominal pain. I do not have a good etiology as to why she has a leukocytosis of 42.  She is not septic.  This could be an undiagnosed malignancy. Primary infection of the aorta is rare, and usually presents as a pseudoaneurysm.   At this time, Armella would be best treated with continued medical management. Will continue to follow.  Parameters - HR <80, SBP<120 Recommend esmolol and nitro   Plan for rescan tomorrow morning. Please call should questions or concerns arise.   Victorino Sparrow MD MS [facility] [phone] 01/20/2023  8:25 AM

## 2023-01-20 NOTE — Progress Notes (Signed)
NAME:  Kristy Sullivan, MRN:  [id number], DOB:  12-03-1945, LOS: 1 ADMISSION DATE:  01/19/2023 CONSULTATION DATE:  01/19/2023 REFERRING MD:  Fuller Plan [facility] EDP CHIEF COMPLAINT:  Chest pain  History of Present Illness:  77 year old woman who presented to [facility] ED 11/18 for chest pain, intermittent abdominal pain and generalized weakness (primarily BLE). PMHx significant for HTN, HLD, AFib (on Eliquis, reportedly not taking), CVA (embolic, MRI Brain 09/2019 with multiple punctate acute infarcts R parietal lobe, c/f R MCA distribution), thrombocytosis.  History is primarily obtained from chart review; it seems patient may be a poor historian as she denies history of AFib but this is well-documented in her chart. Additionally, per last Cardiology/Heme/Onc notes, patient was supposed to be on ASA, Eliquis, statin, hydroxyurea.  On ED arrival, patient was hypertensive to 140s and in Afib with rates 90s-130s. Labs were notable for WBC 50, Hgb 13.1, Plt 1.3 million. INR 1.4, APTT 34. Na 138, K 3.3, CO2 24, Cr 1.04. Trop 81 > 63. BNP 597.5. LA 1.6. UA unremarkable. CXR with findings c/f CHF/pulmonary edema; CTA Chest/A/P with aneurysmal disease of the thoracic aorta at the level of the distal arch/descending aorta with component of acute intramural hemorrhage (arch max caliber 3.9cm, descending aorta max caliber 3.7cm) with continuation into descending aorta likely ending before the origin of the renal arteries. LE Dopplers negative for DVT. TCTS consulted with recommendation for VVS consult, as condition likely nonoperative.  PCCM consulted for ICU admission/transfer to [facility] for further care.  Pertinent Medical History:   Past Medical History:  Diagnosis Date   Embolic stroke (HCC)    a. 09/2019 MRI: several punctate acute infarcts in R parietal lobe affecting the cortical and subcortical brain consistent w/ micro embolic infarcts in R MCA branch vessel distribution.   Hyperlipidemia    Hypertension     Persistent atrial fibrillation (HCC)    a. 09/2019 Dx in setting of ED visit for weakness-->embolic stroke; b. CHA2DS2VASc = 6-->eliquis; c. 09/2019 Echo: EF 50-55%, no rwma, mild LVH. Nl RV size/fxn. Mild LAE, mod RAE. Mild to mod MR.   Thrombocythemia, essential (HCC)    Significant Hospital Events: Including procedures, antibiotic start and stop dates in addition to other pertinent events   11/18 - Presented to [facility] ED for CP/abdominal. Found to have descending aortic intramural hematoma. PCCM consulted for transfer to [facility] for higher level of care. VVS aware.  Interim History / Subjective:  NAEON HR 70s and SBP 90s on Esmolol only currently at 275 Denies chest pain  Objective:  Blood pressure 96/66, pulse 79, temperature 97.9 F (36.6 C), temperature source Oral, resp. rate 16, height 5\' 3"  (1.6 m), weight 68.7 kg, SpO2 95%.        Intake/Output Summary (Last 24 hours) at 01/20/2023 0758 Last data filed at 01/20/2023 0700 Gross per 24 hour  Intake 678.56 ml  Output --  Net 678.56 ml   Filed Weights   01/19/23 2100 01/19/23 2258 01/20/23 0210  Weight: 68.7 kg 68.7 kg 68.7 kg    Physical Examination: General: Adult female, resting in bed, in NAD. Neuro: A&O x 3, no deficits. HEENT: [place]/AT. Sclerae anicteric. EOMI. Cardiovascular: RRR, no M/R/G.  Lungs: Respirations even and unlabored.  CTA bilaterally, No W/R/R. Abdomen: BS x 4, soft, NT/ND.  Musculoskeletal: No gross deformities, no edema.  Skin: Intact, warm, no rashes.  Assessment & Plan:   Descending aortic intramural hematoma CTA Chest/A/P with aneurysmal disease of the thoracic aorta at the level of  the distal arch/descending aorta with component of acute intramural hemorrhage (arch max caliber 3.9cm, descending aorta max caliber 3.7cm) with continuation into descending aorta likely ending before the origin of the renal arteries. - Continue Esmolol for goal SBP < 120, goal HR < 80 per VVS recs - Cleviprex PRN for  above goals - VVS following, appreciate the assistance - Re-image at 48 hours  Leukocytosis Unclear if hematologic/oncologic or acute in the setting of developing infectious etiology. Denies any infectious hx/symptoms and PCT only 0.22. - Trend WBC, fever curve - F/u Cx data - Continue broad-spectrum antibiotics (vanc/Zosyn) for now but low threshold to de-escalate vs stop given neg PCT and no hx  Afib CHA?DS?-VASc score 6. Per Cardiology notes from 09/2019, prescribled Lopressor for rate control and Eliquis for [facility]. Patient reports she is not taking these medications, initially never started due to cost concerns but was then provided patient assistance and did eventually start Eliquis, but is not on it currently. Of note, patient also is JAK2+ and high risk. LE Dopplers fortunately negative for DVT - Cardiac monitoring - Optimize electrolytes for K > 4, Mg > 2 - Continue to hold [facility] for now given intramural hematoma and the fact that she was never taking it. She will eventually require AC resumption in the setting of Afib/thrombocytosis/JAK2 positivity - F/u Echo - Hold PTA Lopressor for now  HTN HLD - Esmolol/Cleviprex gtts as above - Goal SBP < 120 - Hold PTA lopressor for now - Continue ASA/statin  Prior CVA, embolic CTA Head/Neck 09/2019 without LVO, +small infarctions of R parietal region c/w microembolic infarctions. MRI Brain 09/2019 with several punctate acute infarctions R parietal lobe (cortical/subcortical) c/w microembolic infarctions in R MCA branch vessel distribution. Lipid panel WN (total 154, LDL 89, HDL 53, Trigs 60). - Multiple stroke risk factors - Aggressive secondary stroke prevention as indicated - Continue ASA/statin - F/u on echo  Essential thrombocytosis with splenomegaly JAK-2 positive Seen by Heme/Onc 10/2019, placed on hydroxyurea.  - Trend Plt - Consider hydroxyurea resumption - Heme/Onc f/u as outpatient  Pre-diabetes - A1c 6.1 - Healthy diet  encouraged - Exercise encouraged   Meds 07/2020: Voltaren gel - R shoulder Eliquis 5mg  BID ASA 81mg  daily Lipitor 40mg  daily Cod liver il Lopressor 25mg  BID MV Hydroxyurea 1000mg  BID?  Best Practice: (right click and "Reselect all SmartList Selections" daily)   Diet/type: Regular consistency (see orders) DVT prophylaxis: SCDs GI prophylaxis: N/A Lines: N/A Foley:  N/A Code Status:  full code Last date of multidisciplinary goals of care discussion [Pending]    Critical care time: 30 min    Rutherford Guys, PA - C [place] [facility] For pager details, please see AMION or use Epic chat  After 1900, please call ELINK for cross coverage needs 01/20/2023, 8:16 AM

## 2023-01-20 NOTE — Plan of Care (Signed)

## 2023-01-20 NOTE — Progress Notes (Signed)
Pharmacy Antibiotic Note  Kristy Sullivan is a 77 y.o. female admitted on 01/19/2023 with  leukocytosis .  Pharmacy has been consulted for vancomycin dosing.  WBC 50>41.6. Scr 1.5 (CrCl 28 mL/min). Afebrile. PCT 0.22. Received 1 time dose of zosyn and vancomycin in ED. Concern for hematologic malignancy - plan for hematology referral.   Plan: Vancomycin 1250 mg IV every 48 hr (AUC 499, Vd 0.72) - next dose due on 11/20 Zosyn 3.375g IV every 8 hours Monitor renal fx, cx results, clinical pic, and heme/onc recommendations   Height: 5\' 3"  (160 cm) Weight: 68.7 kg (151 lb 7.3 oz) IBW/kg (Calculated) : 52.4  Temp (24hrs), Avg:98.2 F (36.8 C), Min:97.9 F (36.6 C), Max:98.6 F (37 C)  Recent Labs  Lab 01/19/23 1125 01/19/23 1431 01/19/23 2229 01/20/23 0257  WBC 50.0*  50.3*  --  42.0* 41.6*  CREATININE 1.04*  --  1.41* 1.50*  LATICACIDVEN  --  1.6  --   --     Estimated Creatinine Clearance: 28.3 mL/min (A) (by C-G formula based on SCr of 1.5 mg/dL (H)).    No Known Allergies  Antimicrobials this admission: Vancomycin 11/18 >>  Zosyn 11/18 >>   Dose adjustments this admission: N/A  Microbiology results: 11/18 MRSA PCR: neg   Thank you for allowing pharmacy to participate in this patient's care,  Sherron Monday, PharmD, BCCCP Clinical Pharmacist  Phone: [phone] 01/20/2023 10:50 AM  Please check AMION for all [facility] Pharmacy phone numbers After 10:00 PM, call Main Pharmacy [phone]

## 2023-01-21 ENCOUNTER — Inpatient Hospital Stay (HOSPITAL_COMMUNITY): Payer: Medicare PPO

## 2023-01-21 ENCOUNTER — Other Ambulatory Visit: Payer: Self-pay | Admitting: *Deleted

## 2023-01-21 ENCOUNTER — Other Ambulatory Visit: Payer: Self-pay

## 2023-01-21 DIAGNOSIS — N179 Acute kidney failure, unspecified: Secondary | ICD-10-CM | POA: Diagnosis not present

## 2023-01-21 DIAGNOSIS — I161 Hypertensive emergency: Secondary | ICD-10-CM | POA: Diagnosis not present

## 2023-01-21 DIAGNOSIS — I71012 Dissection of descending thoracic aorta: Secondary | ICD-10-CM

## 2023-01-21 DIAGNOSIS — D473 Essential (hemorrhagic) thrombocythemia: Secondary | ICD-10-CM

## 2023-01-21 LAB — ECHOCARDIOGRAM COMPLETE
AR max vel: 2 cm2
AV Area VTI: 2.17 cm2
AV Area mean vel: 2.08 cm2
AV Mean grad: 5 mm[Hg]
AV Peak grad: 8 mm[Hg]
Ao pk vel: 1.41 m/s
Area-P 1/2: 3.02 cm2
Height: 63 in
S' Lateral: 3.1 cm
Weight: 2469.15 [oz_av]

## 2023-01-21 LAB — CBC
HCT: 35.4 % — ABNORMAL LOW (ref 36.0–46.0)
Hemoglobin: 11.2 g/dL — ABNORMAL LOW (ref 12.0–15.0)
MCH: 30.3 pg (ref 26.0–34.0)
MCHC: 31.6 g/dL (ref 30.0–36.0)
MCV: 95.7 fL (ref 80.0–100.0)
Platelets: 1045 10*3/uL (ref 150–400)
RBC: 3.7 MIL/uL — ABNORMAL LOW (ref 3.87–5.11)
RDW: 16.1 % — ABNORMAL HIGH (ref 11.5–15.5)
WBC: 38.5 10*3/uL — ABNORMAL HIGH (ref 4.0–10.5)
nRBC: 0.1 % (ref 0.0–0.2)

## 2023-01-21 LAB — BASIC METABOLIC PANEL
Anion gap: 10 (ref 5–15)
BUN: 24 mg/dL — ABNORMAL HIGH (ref 8–23)
CO2: 19 mmol/L — ABNORMAL LOW (ref 22–32)
Calcium: 8.9 mg/dL (ref 8.9–10.3)
Chloride: 107 mmol/L (ref 98–111)
Creatinine, Ser: 1.96 mg/dL — ABNORMAL HIGH (ref 0.44–1.00)
GFR, Estimated: 26 mL/min — ABNORMAL LOW (ref >60)
Glucose, Bld: 108 mg/dL — ABNORMAL HIGH (ref 70–99)
Potassium: 4.1 mmol/L (ref 3.5–5.1)
Sodium: 136 mmol/L (ref 135–145)

## 2023-01-21 LAB — GLUCOSE, CAPILLARY
Glucose-Capillary: 121 mg/dL — ABNORMAL HIGH (ref 70–99)
Glucose-Capillary: 108 mg/dL — ABNORMAL HIGH (ref 70–99)
Glucose-Capillary: 158 mg/dL — ABNORMAL HIGH (ref 70–99)
Glucose-Capillary: 114 mg/dL — ABNORMAL HIGH (ref 70–99)
Glucose-Capillary: 132 mg/dL — ABNORMAL HIGH (ref 70–99)

## 2023-01-21 LAB — PHOSPHORUS: Phosphorus: 4.9 mg/dL — ABNORMAL HIGH (ref 2.5–4.6)

## 2023-01-21 LAB — PROCALCITONIN: Procalcitonin: 0.27 ng/mL

## 2023-01-21 LAB — MAGNESIUM: Magnesium: 2.3 mg/dL (ref 1.7–2.4)

## 2023-01-21 MED ORDER — IOHEXOL 350 MG/ML SOLN
75.0000 mL | Freq: Once | INTRAVENOUS | Status: AC | PRN
  Administered 2023-01-21: 75 mL via INTRAVENOUS

## 2023-01-21 MED ORDER — POLYETHYLENE GLYCOL 3350 17 G PO PACK
17.0000 g | PACK | Freq: Two times a day (BID) | ORAL | Status: DC
  Administered 2023-01-21 – 2023-01-23 (×6): 17 g via ORAL
  Filled 2023-01-21 (×11): qty 1

## 2023-01-21 MED ORDER — BISACODYL 10 MG RE SUPP: 10.0000 mg | Freq: Every day | RECTAL | Status: DC | PRN

## 2023-01-21 MED ORDER — METOPROLOL TARTRATE 50 MG PO TABS
50.0000 mg | ORAL_TABLET | Freq: Once | ORAL | Status: AC
  Administered 2023-01-21: 50 mg via ORAL
  Filled 2023-01-21: qty 1

## 2023-01-21 MED ORDER — LACTATED RINGERS IV BOLUS
250.0000 mL | Freq: Once | INTRAVENOUS | Status: AC
  Administered 2023-01-21: 250 mL via INTRAVENOUS

## 2023-01-21 MED ORDER — INFLUENZA VAC A&B SURF ANT ADJ 0.5 ML IM SUSY
0.5000 mL | PREFILLED_SYRINGE | INTRAMUSCULAR | Status: DC
  Filled 2023-01-21: qty 0.5

## 2023-01-21 MED ORDER — ENSURE ENLIVE PO LIQD
237.0000 mL | Freq: Two times a day (BID) | ORAL | Status: DC
  Administered 2023-01-21 – 2023-01-22 (×3): 237 mL via ORAL

## 2023-01-21 MED ORDER — PNEUMOCOCCAL 20-VAL CONJ VACC 0.5 ML IM SUSY
0.5000 mL | PREFILLED_SYRINGE | INTRAMUSCULAR | Status: DC
  Filled 2023-01-21: qty 0.5

## 2023-01-21 MED ORDER — BETHANECHOL CHLORIDE 10 MG PO TABS
10.0000 mg | ORAL_TABLET | Freq: Three times a day (TID) | ORAL | Status: DC
  Administered 2023-01-21 – 2023-01-23 (×5): 10 mg via ORAL
  Filled 2023-01-21 (×8): qty 1

## 2023-01-21 MED ORDER — METOPROLOL TARTRATE 50 MG PO TABS
100.0000 mg | ORAL_TABLET | Freq: Two times a day (BID) | ORAL | Status: DC
  Administered 2023-01-21: 100 mg via ORAL
  Filled 2023-01-21 (×2): qty 2

## 2023-01-21 NOTE — Progress Notes (Signed)
NAME:  Kristy Sullivan, MRN:  [id number], DOB:  1946-02-18, LOS: 2 ADMISSION DATE:  01/19/2023 CONSULTATION DATE:  01/19/2023 REFERRING MD:  Fuller Plan [facility] EDP CHIEF COMPLAINT:  Chest pain  History of Present Illness:  77 year old woman who presented to [facility] ED 11/18 for chest pain, intermittent abdominal pain and generalized weakness (primarily BLE). PMHx significant for HTN, HLD, AFib (on Eliquis, reportedly not taking), CVA (embolic, MRI Brain 09/2019 with multiple punctate acute infarcts R parietal lobe, c/f R MCA distribution), thrombocytosis.  History is primarily obtained from chart review; it seems patient may be a poor historian as she denies history of AFib but this is well-documented in her chart. Additionally, per last Cardiology/Heme/Onc notes, patient was supposed to be on ASA, Eliquis, statin, hydroxyurea.  On ED arrival, patient was hypertensive to 140s and in Afib with rates 90s-130s. Labs were notable for WBC 50, Hgb 13.1, Plt 1.3 million. INR 1.4, APTT 34. Na 138, K 3.3, CO2 24, Cr 1.04. Trop 81 > 63. BNP 597.5. LA 1.6. UA unremarkable. CXR with findings c/f CHF/pulmonary edema; CTA Chest/A/P with aneurysmal disease of the thoracic aorta at the level of the distal arch/descending aorta with component of acute intramural hemorrhage (arch max caliber 3.9cm, descending aorta max caliber 3.7cm) with continuation into descending aorta likely ending before the origin of the renal arteries. LE Dopplers negative for DVT. TCTS consulted with recommendation for VVS consult, as condition likely nonoperative.  PCCM consulted for ICU admission/transfer to [facility] for further care.  Pertinent Medical History:   Past Medical History:  Diagnosis Date   Embolic stroke (HCC)    a. 09/2019 MRI: several punctate acute infarcts in R parietal lobe affecting the cortical and subcortical brain consistent w/ micro embolic infarcts in R MCA branch vessel distribution.   Hyperlipidemia    Hypertension     Persistent atrial fibrillation (HCC)    a. 09/2019 Dx in setting of ED visit for weakness-->embolic stroke; b. CHA2DS2VASc = 6-->eliquis; c. 09/2019 Echo: EF 50-55%, no rwma, mild LVH. Nl RV size/fxn. Mild LAE, mod RAE. Mild to mod MR.   Thrombocythemia, essential (HCC)     Meds 07/2020: Voltaren gel - R shoulder Eliquis 5mg  BID ASA 81mg  daily Lipitor 40mg  daily Cod liver il Lopressor 25mg  BID MV Hydroxyurea 1000mg  BID?  Significant Hospital Events: Including procedures, antibiotic start and stop dates in addition to other pertinent events   11/18 - Presented to [facility] ED for CP/abdominal. Found to have descending aortic intramural hematoma. PCCM consulted for transfer to [facility] for higher level of care. VVS aware. 11/19 esmolol 275 and intermittent cleviprex low dose  Interim History / Subjective:  - denies any complaints - renal function up, unmeasured urinary occurrence overnight, otherwise voided 50ml this am - overall poor PO intake  - intermittently on cleviprex overnight, esmolol remains at 275 mcg/kg/min, SBP within goal, HR 70-80s  Objective:  Blood pressure 100/62, pulse 83, temperature 98.2 F (36.8 C), temperature source Oral, resp. rate 20, height 5\' 3"  (1.6 m), weight 70 kg, SpO2 98%.        Intake/Output Summary (Last 24 hours) at 01/21/2023 0803 Last data filed at 01/21/2023 0749 Gross per 24 hour  Intake 1397.8 ml  Output 350 ml  Net 1047.8 ml   Filed Weights   01/19/23 2258 01/20/23 0210 01/21/23 0425  Weight: 68.7 kg 68.7 kg 70 kg    Physical Examination: General:  Older female sitting in bedside recliner in NAD HEENT: MM pink/ minimally moist, pupils  3/r, mild temporal wasting Neuro: poor historian, oriented to person, hospital, date, MAE- generalized weakness CV: afib rate70s PULM:  non labored, clear, RA GI: soft, hypoBS, ?distended, NT, voids Extremities: warm/dry, no LE edema  Skin: no rashes   UOP 300 ml/ 24hrs ( 50 ml/ 12hrs with x1 unmeasured  urinary occurrence  Net +1.7 L  Assessment & Plan:   Descending aortic intramural hematoma, likely in the setting of uncontrolled HTN CTA Chest/A/P with aneurysmal disease of the thoracic aorta at the level of the distal arch/descending aorta with component of acute intramural hemorrhage (arch max caliber 3.9cm, descending aorta max caliber 3.7cm) with continuation into descending aorta likely ending before the origin of the renal arteries. - poor historian, but not taken BP meds in some time, not seen healthcare provider since 2022 P:  - H/H remains stable - VVS following - SBP remains in goal, but HR still 70-90's - goal SBP < 120, goal HR < 80 per VVS recs - cont esmolol, prn cleviprex  - due for re-imaging this afternoon.  sCr up, with poor PO intake, 1L LR over several hours and encouraged to drink - cont norvasc 10mg .  Increase lopressor 50> 100mg  BID and wean esmolol as tolerated   Leukocytosis and thrombocytosis with mild anemia, splenomegaly JAK-2 positive - Seen by Heme/Onc 10/2019, placed on hydroxyurea, unclear when she stopped taking, lost in f/u - Denies any infectious hx/symptoms  - PCT 0.22 11/19 P:  - cont to trend CBC, stable - follow [employer] 11/18 > ngtd)/ fever curve - add on PCT to am labs> 0.22> 0.27> d/c abx and monitor clinically - per oncology recs, defer restarting hydrea during inpt hospitalization, to f/u with Dr. Donneta Romberg after discharge.  Recs to restart anticoagulation when cleared by VVS given high risk of arterial and venous thromboembolic events   Afib CHA?DS?-VASc score 6. Per Cardiology notes from 09/2019, prescribled Lopressor for rate control and Eliquis for [facility]. Patient reports she is not taking these medications, initially never started due to cost concerns but was then provided patient assistance and did eventually start Eliquis, but is not on it currently. Of note, patient also is JAK2+ and high risk. LE Dopplers fortunately negative for DVT -  cont tele monitoring, remains rate controlled - metoprolol as above - optimize electrolytes for K > 4, Mg > 2 - cont holding AC for now.  Will need to restart when cleared by VVS - F/u Echo  AKI, unclear if baseline CKD - likely multifactorial> HTN, poor PO intake, IV contrast - poor PO intake. 1L LR now, encourage fluids/ intake.  UA looked ok on admit, high SG - has not tolerated purwick, one urinary occurrence overnight - bladder scan prn  - trend renal indices  - strict I/Os, daily wts - avoid nephrotoxins, renal dose meds, hemodynamic support as above   HTN HLD - BP management as above - cont ASA/ statin - Continue ASA/statin  Prior CVA, embolic CTA Head/Neck 09/2019 without LVO, +small infarctions of R parietal region c/w microembolic infarctions. MRI Brain 09/2019 with several punctate acute infarctions R parietal lobe (cortical/subcortical) c/w microembolic infarctions in R MCA branch vessel distribution. Lipid panel WN (total 154, LDL 89, HDL 53, Trigs 60). - monitor neuro - ongoing aggressive secondary stroke prevention as indicated - ASA/ statin - F/u on echo   Pre-diabetes - A1c 6.1 - heart healthy/ carb modified - prn SSI   At risk for malnutrition - poor historian, states has been trying  to loose weight intermittently, very active per pt/ niece at bedside, visits [employer] frequently/ walks - ensure BID   Best Practice: (right click and "Reselect all SmartList Selections" daily)   Diet/type: Regular consistency (see orders) DVT prophylaxis: SCDs GI prophylaxis: N/A Lines: N/A Foley:  N/A Code Status:  full code Last date of multidisciplinary goals of care discussion [Pending]  Pt and niece updated at bedside   Critical care time: 30 min    Posey Boyer, MSN, AG-ACNP-BC [place] [facility] 01/21/2023, 8:03 AM  See Amion for pager If no response to pager , please call [phone] until 7pm After 7:00 pm call Elink  [phone]

## 2023-01-21 NOTE — Plan of Care (Signed)

## 2023-01-21 NOTE — Progress Notes (Signed)
Failed attempt at [facility] cath x3 writer first 2 attempts and Associate Professor 3rd attempt. Pt tolerated well. Selmer Dominion NP aware.

## 2023-01-21 NOTE — Plan of Care (Signed)
Pt slowly progressing, tolerates getting up to chair and going to bathroom with minimal support. Continues to have poor appetite and decreased urine output. Will bladder scan Q4 hours.

## 2023-01-21 NOTE — Progress Notes (Addendum)
  Progress Note    01/21/2023 8:39 AM * No surgery found *  Subjective:  no complaints. Denies any back, abdominal, or chest pain    Vitals:   01/21/23 0700 01/21/23 0806  BP: 100/62   Pulse: 83   Resp: 20   Temp:  98.2 F (36.8 C)  SpO2: 98%     Physical Exam: General:  resting comfortably in bed, NAD Cardiac:  regular, SBPs <120 Lungs:  nonlabored Extremities:  palpable DP pulses bilaterally, L>R  CBC    Component Value Date/Time   WBC 38.5 (H) 01/21/2023 0233   RBC 3.70 (L) 01/21/2023 0233   HGB 11.2 (L) 01/21/2023 0233   HCT 35.4 (L) 01/21/2023 0233   PLT 1,045 (HH) 01/21/2023 0233   MCV 95.7 01/21/2023 0233   MCH 30.3 01/21/2023 0233   MCHC 31.6 01/21/2023 0233   RDW 16.1 (H) 01/21/2023 0233   LYMPHSABS 0.4 (L) 01/19/2023 2229   MONOABS 1.3 (H) 01/19/2023 2229   EOSABS 0.1 01/19/2023 1125   BASOSABS 0.2 (H) 01/19/2023 1125    BMET    Component Value Date/Time   NA 136 01/21/2023 0233   K 4.1 01/21/2023 0233   CL 107 01/21/2023 0233   CO2 19 (L) 01/21/2023 0233   GLUCOSE 108 (H) 01/21/2023 0233   BUN 24 (H) 01/21/2023 0233   CREATININE 1.96 (H) 01/21/2023 0233   CALCIUM 8.9 01/21/2023 0233   GFRNONAA 26 (L) 01/21/2023 0233   GFRAA >60 10/31/2019 0920    INR    Component Value Date/Time   INR 1.6 (H) 01/19/2023 2229     Intake/Output Summary (Last 24 hours) at 01/21/2023 0839 Last data filed at 01/21/2023 0749 Gross per 24 hour  Intake 1397.8 ml  Output 350 ml  Net 1047.8 ml      Assessment/Plan:  77 y.o. female with aortic intramural hematoma   -No issues overnight. She continues to be asymptomatic with conservative management -BLE well perfused with no change in exam. Palpable DP pulses, L>R -Good impulse control. HR<80 and SBP<120 -Unfortunately Scr has been climbing and is at 1.96 today. GFR <30. May have to delay repeat CTA scan   Loel Dubonnet, PA-C [facility] [phone] 01/21/2023 8:39  AM  VASCULAR STAFF ADDENDUM: I have independently interviewed and examined the patient. I agree with the above.  Discussed Scan with Dr. Chestine Spore. Agreed we should move forward. Likely etiology of renal dysfunction is normotension as opposed to her normal hypertension.   Victorino Sparrow MD [facility] Phone Number: [phone] 01/21/2023 1:56 PM

## 2023-01-21 NOTE — Progress Notes (Signed)
Imaging reviewed. No major changes to the Aorta. IMH still present. Clinically Asymptomatic - will continue to follow.  Plan for 1 month repeat scan.  Continue anti-impulse control  Victorino Sparrow MD

## 2023-01-21 NOTE — Progress Notes (Signed)
eLink Physician-Brief Progress Note Patient Name: Kristy Sullivan DOB: February 01, 1946 MRN: [id number]   Date of Service  01/21/2023  HPI/Events of Note  77 year old female with a history of stroke, dyslipidemia, essential hypertension, and persistent atrial fibrillation on chronic anticoagulation with Eliquis with questionable adherence that presents with intermittent abdominal and chest pain found to have intramural hematoma at the level of the arch extending down to renal arteries. Admitted for medical management.   Clarifying Esmolol order for HR. (PCCM goal <60, vascular goal <80, esmolol order goal <70)  Low UOP in the setting of AKI. +649 for the day, +1328 since admit. Cr 1.5  eICU Interventions  Titrate to HR < 60, order updated   Continue observation with regards to UOP. No intervention at this time.      Intervention Category Minor Interventions: Routine modifications to care plan (e.g. PRN medications for pain, fever)  [name] 01/21/2023, 12:23 AM

## 2023-01-22 ENCOUNTER — Inpatient Hospital Stay (HOSPITAL_COMMUNITY): Payer: Medicare PPO

## 2023-01-22 ENCOUNTER — Encounter (HOSPITAL_COMMUNITY): Payer: Self-pay | Admitting: Critical Care Medicine

## 2023-01-22 ENCOUNTER — Other Ambulatory Visit (HOSPITAL_COMMUNITY): Payer: Medicare PPO

## 2023-01-22 DIAGNOSIS — Z515 Encounter for palliative care: Secondary | ICD-10-CM | POA: Diagnosis not present

## 2023-01-22 DIAGNOSIS — J90 Pleural effusion, not elsewhere classified: Secondary | ICD-10-CM

## 2023-01-22 DIAGNOSIS — I10 Essential (primary) hypertension: Secondary | ICD-10-CM

## 2023-01-22 DIAGNOSIS — J9601 Acute respiratory failure with hypoxia: Secondary | ICD-10-CM

## 2023-01-22 DIAGNOSIS — R627 Adult failure to thrive: Secondary | ICD-10-CM | POA: Diagnosis not present

## 2023-01-22 DIAGNOSIS — I480 Paroxysmal atrial fibrillation: Secondary | ICD-10-CM

## 2023-01-22 DIAGNOSIS — I5020 Unspecified systolic (congestive) heart failure: Secondary | ICD-10-CM | POA: Diagnosis not present

## 2023-01-22 DIAGNOSIS — I161 Hypertensive emergency: Secondary | ICD-10-CM | POA: Diagnosis not present

## 2023-01-22 DIAGNOSIS — I4891 Unspecified atrial fibrillation: Secondary | ICD-10-CM

## 2023-01-22 DIAGNOSIS — I5021 Acute systolic (congestive) heart failure: Secondary | ICD-10-CM

## 2023-01-22 DIAGNOSIS — Z7189 Other specified counseling: Secondary | ICD-10-CM | POA: Diagnosis not present

## 2023-01-22 DIAGNOSIS — I5031 Acute diastolic (congestive) heart failure: Secondary | ICD-10-CM

## 2023-01-22 DIAGNOSIS — N179 Acute kidney failure, unspecified: Secondary | ICD-10-CM | POA: Diagnosis not present

## 2023-01-22 DIAGNOSIS — R7303 Prediabetes: Secondary | ICD-10-CM

## 2023-01-22 DIAGNOSIS — I71012 Dissection of descending thoracic aorta: Secondary | ICD-10-CM | POA: Diagnosis not present

## 2023-01-22 DIAGNOSIS — D649 Anemia, unspecified: Secondary | ICD-10-CM

## 2023-01-22 LAB — LACTATE DEHYDROGENASE: LDH: 349 U/L — ABNORMAL HIGH (ref 98–192)

## 2023-01-22 LAB — GLUCOSE, CAPILLARY
Glucose-Capillary: 104 mg/dL — ABNORMAL HIGH (ref 70–99)
Glucose-Capillary: 119 mg/dL — ABNORMAL HIGH (ref 70–99)
Glucose-Capillary: 126 mg/dL — ABNORMAL HIGH (ref 70–99)
Glucose-Capillary: 127 mg/dL — ABNORMAL HIGH (ref 70–99)
Glucose-Capillary: 153 mg/dL — ABNORMAL HIGH (ref 70–99)
Glucose-Capillary: 190 mg/dL — ABNORMAL HIGH (ref 70–99)

## 2023-01-22 LAB — PROTEIN, TOTAL: Total Protein: 5.4 g/dL — ABNORMAL LOW (ref 6.5–8.1)

## 2023-01-22 LAB — RENAL FUNCTION PANEL
Albumin: 2.6 g/dL — ABNORMAL LOW (ref 3.5–5.0)
Anion gap: 12 (ref 5–15)
BUN: 34 mg/dL — ABNORMAL HIGH (ref 8–23)
CO2: 15 mmol/L — ABNORMAL LOW (ref 22–32)
Calcium: 8.9 mg/dL (ref 8.9–10.3)
Chloride: 107 mmol/L (ref 98–111)
Creatinine, Ser: 2.29 mg/dL — ABNORMAL HIGH (ref 0.44–1.00)
GFR, Estimated: 21 mL/min — ABNORMAL LOW (ref >60)
Glucose, Bld: 106 mg/dL — ABNORMAL HIGH (ref 70–99)
Phosphorus: 4.6 mg/dL (ref 2.5–4.6)
Potassium: 4.5 mmol/L (ref 3.5–5.1)
Sodium: 134 mmol/L — ABNORMAL LOW (ref 135–145)

## 2023-01-22 LAB — CBC
HCT: 35.2 % — ABNORMAL LOW (ref 36.0–46.0)
Hemoglobin: 11.4 g/dL — ABNORMAL LOW (ref 12.0–15.0)
MCH: 31.1 pg (ref 26.0–34.0)
MCHC: 32.4 g/dL (ref 30.0–36.0)
MCV: 95.9 fL (ref 80.0–100.0)
Platelets: 1046 10*3/uL (ref 150–400)
RBC: 3.67 MIL/uL — ABNORMAL LOW (ref 3.87–5.11)
RDW: 16.3 % — ABNORMAL HIGH (ref 11.5–15.5)
WBC: 41.6 10*3/uL — ABNORMAL HIGH (ref 4.0–10.5)
nRBC: 0.1 % (ref 0.0–0.2)

## 2023-01-22 LAB — BODY FLUID CELL COUNT WITH DIFFERENTIAL
Eos, Fluid: 0 %
Lymphs, Fluid: 6 %
Monocyte-Macrophage-Serous Fluid: 10 % — ABNORMAL LOW (ref 50–90)
Neutrophil Count, Fluid: 84 % — ABNORMAL HIGH (ref 0–25)
Total Nucleated Cell Count, Fluid: 1073 uL — ABNORMAL HIGH (ref 0–1000)

## 2023-01-22 LAB — PROTEIN, PLEURAL OR PERITONEAL FLUID: Total protein, fluid: <3 g/dL

## 2023-01-22 LAB — LACTATE DEHYDROGENASE, PLEURAL OR PERITONEAL FLUID: LD, Fluid: 188 U/L — ABNORMAL HIGH (ref 3–23)

## 2023-01-22 LAB — GLUCOSE, PLEURAL OR PERITONEAL FLUID: Glucose, Fluid: 124 mg/dL

## 2023-01-22 MED ORDER — ALBUMIN HUMAN 5 % IV SOLN
12.5000 g | Freq: Four times a day (QID) | INTRAVENOUS | Status: AC
  Administered 2023-01-22 (×2): 12.5 g via INTRAVENOUS
  Filled 2023-01-22 (×2): qty 250

## 2023-01-22 MED ORDER — ENSURE ENLIVE PO LIQD
237.0000 mL | Freq: Three times a day (TID) | ORAL | Status: DC
  Administered 2023-01-22 – 2023-01-27 (×12): 237 mL via ORAL

## 2023-01-22 MED ORDER — CARVEDILOL 12.5 MG PO TABS: 12.5000 mg | ORAL_TABLET | Freq: Two times a day (BID) | ORAL | Status: DC

## 2023-01-22 MED ORDER — BISACODYL 10 MG RE SUPP
10.0000 mg | Freq: Once | RECTAL | Status: AC
  Administered 2023-01-22: 10 mg via RECTAL
  Filled 2023-01-22: qty 1

## 2023-01-22 MED ORDER — CARVEDILOL 6.25 MG PO TABS
6.2500 mg | ORAL_TABLET | Freq: Two times a day (BID) | ORAL | Status: DC
  Administered 2023-01-22 – 2023-01-27 (×10): 6.25 mg via ORAL
  Filled 2023-01-22 (×10): qty 1

## 2023-01-22 MED ORDER — SODIUM BICARBONATE 650 MG PO TABS
650.0000 mg | ORAL_TABLET | Freq: Three times a day (TID) | ORAL | Status: DC
  Administered 2023-01-22 (×3): 650 mg via ORAL
  Filled 2023-01-22 (×3): qty 1

## 2023-01-22 MED ORDER — CARVEDILOL 25 MG PO TABS
25.0000 mg | ORAL_TABLET | Freq: Two times a day (BID) | ORAL | Status: DC
  Administered 2023-01-22: 25 mg via ORAL
  Filled 2023-01-22: qty 1

## 2023-01-22 NOTE — Progress Notes (Signed)
NAME:  Kristy Sullivan, MRN:  [id number], DOB:  1945/11/24, LOS: 3 ADMISSION DATE:  01/19/2023 CONSULTATION DATE:  01/19/2023 REFERRING MD:  Fuller Plan [facility] EDP CHIEF COMPLAINT:  Chest pain  History of Present Illness:  77 year old woman who presented to [facility] ED 11/18 for chest pain, intermittent abdominal pain and generalized weakness (primarily BLE). PMHx significant for HTN, HLD, AFib (on Eliquis, reportedly not taking), CVA (embolic, MRI Brain 09/2019 with multiple punctate acute infarcts R parietal lobe, c/f R MCA distribution), thrombocytosis.  History is primarily obtained from chart review; it seems patient may be a poor historian as she denies history of AFib but this is well-documented in her chart. Additionally, per last Cardiology/Heme/Onc notes, patient was supposed to be on ASA, Eliquis, statin, hydroxyurea.  On ED arrival, patient was hypertensive to 140s and in Afib with rates 90s-130s. Labs were notable for WBC 50, Hgb 13.1, Plt 1.3 million. INR 1.4, APTT 34. Na 138, K 3.3, CO2 24, Cr 1.04. Trop 81 > 63. BNP 597.5. LA 1.6. UA unremarkable. CXR with findings c/f CHF/pulmonary edema; CTA Chest/A/P with aneurysmal disease of the thoracic aorta at the level of the distal arch/descending aorta with component of acute intramural hemorrhage (arch max caliber 3.9cm, descending aorta max caliber 3.7cm) with continuation into descending aorta likely ending before the origin of the renal arteries. LE Dopplers negative for DVT. TCTS consulted with recommendation for VVS consult, as condition likely nonoperative.  PCCM consulted for ICU admission/transfer to [facility] for further care.  Pertinent Medical History:   Past Medical History:  Diagnosis Date   Embolic stroke (HCC)    a. 09/2019 MRI: several punctate acute infarcts in R parietal lobe affecting the cortical and subcortical brain consistent w/ micro embolic infarcts in R MCA branch vessel distribution.   Hyperlipidemia    Hypertension     Persistent atrial fibrillation (HCC)    a. 09/2019 Dx in setting of ED visit for weakness-->embolic stroke; b. CHA2DS2VASc = 6-->eliquis; c. 09/2019 Echo: EF 50-55%, no rwma, mild LVH. Nl RV size/fxn. Mild LAE, mod RAE. Mild to mod MR.   Thrombocythemia, essential (HCC)     Meds 07/2020: Voltaren gel - R shoulder Eliquis 5mg  BID ASA 81mg  daily Lipitor 40mg  daily Cod liver il Lopressor 25mg  BID MV Hydroxyurea 1000mg  BID?  Significant Hospital Events: Including procedures, antibiotic start and stop dates in addition to other pertinent events   11/18 - Presented to [facility] ED for CP/abdominal. Found to have descending aortic intramural hematoma. PCCM consulted for transfer to [facility] for higher level of care. VVS aware. 11/19 esmolol 275 and intermittent cleviprex low dose 11/20 repeat CTA - stable intramural hematoma, urinary retention, sCr up  Interim History / Subjective:  - continues to deny complaints - drank half ensure and of water yesterday otherwise very poor PO intake - required foley placement for urinary retention/ worsening sCr - remains on esmolol 210  Objective:  Blood pressure 111/71, pulse 75, temperature 98.2 F (36.8 C), temperature source Oral, resp. rate 20, height 5\' 3"  (1.6 m), weight 63.6 kg, SpO2 95%.        Intake/Output Summary (Last 24 hours) at 01/22/2023 0749 Last data filed at 01/22/2023 0700 Gross per 24 hour  Intake 1886.71 ml  Output 1370 ml  Net 516.71 ml   Filed Weights   01/20/23 0210 01/21/23 0425 01/22/23 0700  Weight: 68.7 kg 70 kg 63.6 kg    Physical Examination: General:  Frail appearing older female sitting upright in bed  bedside recliner HEENT: MM pink/minimally moist, normal JVP, pupils 3/r Neuro: oriented x3, MAE w/generalized weakness, no focal deficits CV: irir, afib, no murmur PULM:  non labored, clear anteriorly, diminished in left base, few rales in right base GI: distended but denies tenderness, +bs, foley-  cyu Extremities: warm/dry, no pitting tibal edema  Skin: no rashes   UOP 1.4L/ 24hrs  Overall I/O's inaccurate due to unmeasured urinary occurrences Labs> Na 134, bicarb 15, BUN/ sCR 24/ 1.96> 34/ 2.29, WBC 38.5> 41.6, H/H stable, pts stable 1046  11/20 CT chest/a/p w/wo>  1. Unchanged contour and caliber of the descending thoracic aorta again with a posterior intramural hematoma arising just distal to the left subclavian artery origin. Maximum caliber of the proximal descending thoracic aorta 3.8 x 3.8 cm. 2. Intramural hematoma extends into the upper abdominal aorta, to approximately the level of the celiac origin, maximum caliber of the vessel in this vicinity 3.3 x 3.1 cm. 3. Gross cardiomegaly. Unchanged small pericardial effusion. 4. Moderate left, small right pleural effusions, increased in volume compared to prior examination. 5. Severe anasarca and small volume ascites. 6. Mild splenomegaly. 7. Uterine fibroids. Aortic Atherosclerosis  Echo 11/20 - variability with Afib, EF 45%, mildly reduced RV, LA mod dilated, RA severely dilated, small pericardial effusion, mod left pleural effusion, moderate TV regurgitation, suggested RA pressure 15 with hepatic reflux. Severe concentric LVH, n  Assessment & Plan:   Descending aortic intramural hematoma, likely in the setting of uncontrolled HTN CTA Chest/A/P with aneurysmal disease of the thoracic aorta at the level of the distal arch/descending aorta with component of acute intramural hemorrhage (arch max caliber 3.9cm, descending aorta max caliber 3.7cm) with continuation into descending aorta likely ending before the origin of the renal arteries. - poor historian, but not taken BP meds in some time, not seen healthcare provider since 2022 P:  - Appreciate VVS input. Stable CTA 11/20.  Continue medical management with SBP < 120, HR < 80 - change metoprolol to coreg to get off esmolol - cont norvasc  Leukocytosis and thrombocytosis  with mild anemia, splenomegaly JAK-2 positive Anasarca with ascites - Seen by Heme/Onc 10/2019, placed on hydroxyurea, unclear when she stopped taking, lost in f/u x 2 yrs - Denies any infectious hx/symptoms  - PCT 0.22 11/19 P:  - cont to trend CBC - follow [facility] 11/18 > ngtd)/ fever curve - PCT reassuring and abx stopped 11/20.  Repeat CT 11/20 showing worsening left pleural.  Assessed on bedside US, plans for diagnostic thoracentesis as below - RUQ liver US with doppler studies given significant increase ascites, HF vs just related to malnutrition?  Check LFTs in am - per oncology recs 11/19, defer restarting hydrea during inpt hospitalization, to f/u with Dr. Donneta Romberg after discharge.  Recs to restart anticoagulation when cleared by VVS given high risk of arterial and venous thromboembolic events - concern for underlying process, ?transition into MDS process   Afib Systolic HF/ cardiomyopathy  CHA?DS?-VASc score 6. Per Cardiology notes from 09/2019, prescribled Lopressor for rate control and Eliquis for [facility]. Patient reports she is not taking these medications, initially never started due to cost concerns but was then provided patient assistance and did eventually start Eliquis, but is not on it currently. Of note, patient also is JAK2+ and high risk. LE Dopplers fortunately negative for DVT - tele monitoring, remain rate controlled - BB as above - optimize electrolytes for K > 4, Mg > 2 - cont holding AC for now.  Will need to restart when cleared by VVS, likely off for 1 month - echo as above.  EF down from 55 to now 45%, severe concentric LVH, recs to consider infiltrative cardiomyopathy such as amyloid vs uncontrolled HTN/ Afib.  Previously followed by [facility] in 2021.  Will consult cardiology for further recommendations   Hypoxic respiratory failure Moderate left pleural effusion, small right Bibasilar atelectasis  - cont to wean supplemental O2 - diagnostic left  thoracentesis today, will send for culture, cytology, flow cytometry, pleural studies - encourage pulmonary hygiene- IS, mobilizing to chair - PT   AKI, unclear if baseline CKD Urinary retention NAGMA, mild - likely multifactorial> suspect uncontrolled HTN now controlled, poor PO intake, IV contrast x2 - poor PO intake continues to be issue, monitor  - sCr up today, no evidence of hydro/ obstruction on prior CT a/p 11/20 - start oral bicarb  - cont bethanecol for retention. Continue foley for now given AKI - trend renal indices  - strict I/Os, daily wts - avoid nephrotoxins, renal dose meds, hemodynamic support as above   HTN HLD - BP management as above - cont ASA/ statin   Prior CVA, embolic CTA Head/Neck 09/2019 without LVO, +small infarctions of R parietal region c/w microembolic infarctions. MRI Brain 09/2019 with several punctate acute infarctions R parietal lobe (cortical/subcortical) c/w microembolic infarctions in R MCA branch vessel distribution. Lipid panel WN (total 154, LDL 89, HDL 53, Trigs 60). - monitor neuro, no focal changes - ongoing aggressive secondary stroke prevention as indicated - ASA/ statin - echo as above   Pre-diabetes - A1c 6.1 - heart healthy/ carb modified - prn SSI   Protein calorie malnutrition - poor historian, states has been trying to loose weight intermittently (sounds more like ascites), very active per pt/ niece at bedside, visits [employer] frequently/ walks and still works at [employer].  Evidence of temporal wasting, low protein/ albumin, and worsening ascites - ensure BID, RD consult given ongoing poor PO intake, ?FTT.  Need to consider cortrak before weekend   Overall - PMT consulted to help determine her overall GOCs given pt is high risk for significant complications with underlying medical problems with now new issues 2/2 to stopping her meds ~17yrs ago and lost to medical f/u and took more holistic approach.  Family report also  financial burden with meds and was intolerant of side effects with some of her meds.      Best Practice: (right click and "Reselect all SmartList Selections" daily)   Diet/type: Regular consistency (see orders) DVT prophylaxis: SCDs GI prophylaxis: N/A Lines: N/A Foley:  Yes, and it is still needed Code Status:  full code Last date of multidisciplinary goals of care discussion [Pending]    Critical care time: 30 min    Posey Boyer, MSN, AG-ACNP-BC [facility] 01/22/2023, 7:49 AM  See Amion for pager If no response to pager , please call [phone] until 7pm After 7:00 pm call Elink  [phone]

## 2023-01-22 NOTE — Progress Notes (Addendum)
  Progress Note    01/22/2023 7:45 AM * No surgery found *  Subjective:  says she feels okay. No pain in the chest, abdomen, or back    Vitals:   01/22/23 0630 01/22/23 0700  BP:  111/71  Pulse: 72 75  Resp: (!) 21 20  Temp:    SpO2: 97% 95%    Physical Exam: General:  sitting in the chair comfortably, alert Cardiac:  regular, HR<80. SBPs< 120 Lungs:  nonlabored Extremities:  BLE warm and well perfused  CBC    Component Value Date/Time   WBC 41.6 (H) 01/22/2023 0252   RBC 3.67 (L) 01/22/2023 0252   HGB 11.4 (L) 01/22/2023 0252   HCT 35.2 (L) 01/22/2023 0252   PLT 1,046 (HH) 01/22/2023 0252   MCV 95.9 01/22/2023 0252   MCH 31.1 01/22/2023 0252   MCHC 32.4 01/22/2023 0252   RDW 16.3 (H) 01/22/2023 0252   LYMPHSABS 0.4 (L) 01/19/2023 2229   MONOABS 1.3 (H) 01/19/2023 2229   EOSABS 0.1 01/19/2023 1125   BASOSABS 0.2 (H) 01/19/2023 1125    BMET    Component Value Date/Time   NA 134 (L) 01/22/2023 0252   K 4.5 01/22/2023 0252   CL 107 01/22/2023 0252   CO2 15 (L) 01/22/2023 0252   GLUCOSE 106 (H) 01/22/2023 0252   BUN 34 (H) 01/22/2023 0252   CREATININE 2.29 (H) 01/22/2023 0252   CALCIUM 8.9 01/22/2023 0252   GFRNONAA 21 (L) 01/22/2023 0252   GFRAA >60 10/31/2019 0920    INR    Component Value Date/Time   INR 1.6 (H) 01/19/2023 2229     Intake/Output Summary (Last 24 hours) at 01/22/2023 0745 Last data filed at 01/22/2023 0700 Gross per 24 hour  Intake 1886.71 ml  Output 1420 ml  Net 466.71 ml      Assessment/Plan:  77 y.o. female with aortic intramural hematoma   -Patient had repeat scan yesterday of her aorta. No interval changes of her hematoma appreciated -She remains asymptomatic from her IMH. No abdominal, back, or chest pain -She has had good impulse control with SBPs<120 and HR<80, continue with these parameters -Unfortunately her Scr continues to rise and is at 2.29 this morning. Potentially this is due to kidney hypoperfusion from  newly regulated blood pressures. Will continue to monitor -WBC continues to rise as well at 41.6 today. Unsure of the cause of her leukocytosis. We do not suspect her aorta is infected -Recommend palliative care involvement    Loel Dubonnet PA-C [facility] [phone] 01/22/2023 7:45 AM   VASCULAR STAFF ADDENDUM: I have independently interviewed and examined the patient. I agree with the above.  Overall looks okay on exam today.  Remains asymptomatic I do not have an etiology for her leukocytosis, furthermore her creatinine continues to worsen. I think the creatinine worsening is likely due to hypoperfusion in the setting of normotension Regardless, I think [place] would benefit from palliative care discussions.  She stopped her medications sometime ago, and per nursing, has a holistic view on life. Recommend continuing NT and pulse control Would hold off on anticoagulation at this time, understanding that her CHA2DS2-VASc is high.   Will continue to follow closely.  Victorino Sparrow MD [facility] Phone Number: [phone] 01/22/2023 8:25 AM

## 2023-01-22 NOTE — Consult Note (Signed)
Consultation Note Date: 01/22/2023   Patient Name: Kristy Sullivan  DOB: 1945-03-23  MRN: [id number]  Age / Sex: 77 y.o., female  PCP: Patient, No Pcp Per Referring Physician: Steffanie Dunn, DO  Reason for Consultation: Establishing goals of care  HPI/Patient Profile: 77 y.o. female  with past medical history of CVA 2021, HTN, HLD, a fib, and thrombocytosis admitted on 01/19/2023 with chest pain, abdominal pain, and weakness. Diagnosed with descending aortic intramural hematoma, likely d/t uncontrolled htn. Also with leukocytosis, thrombocytosis, anemia, splenomegaly, JAK-2 positive, anasarca w/ascites, and AKI. Found to have EF down to 45% from 55%. Also found with moderate left pleural effusion. Patient with poor intake. Of note, patient lost to follow up about 2 years ago in favor of holistic approach.  PMT consulted as patient is a high risk for significant complications.    Clinical Assessment and Goals of Care: I have reviewed medical records including EPIC notes, labs and imaging, received report from Dr Chestine Spore and Nehemiah Settle, NP, assessed the patient and then met with patient, spouse Darrel, and niece Judeth Cornfield  to discuss diagnosis prognosis, GOC, EOL wishes, disposition and options.  I introduced Palliative Medicine as specialized medical care for people living with serious illness. It focuses on providing relief from the symptoms and stress of a serious illness. The goal is to improve quality of life for both the patient and the family.  We discussed a brief life review of the patient. They tell about her marriage to [place] and their 3 sons. They tell me patient still works - had gotten ready to go to work just prior to arrival to hospital. 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 Family tells me she had been very active and had a good appetite prior to admission. Family tells me patient typically cognitively intact, they  do admit some occasional forgetfulness but they describe it as minimal such as forgetting where she put her keys.    We discussed patient's current illness and what it means in the larger context of patient's on-going co-morbidities.  Natural disease trajectory and expectations at EOL were discussed. We discuss complicated situation with ongoing work up. Family is tearful during conversation, niece recognizes how ill patient is.   Patient denies any symptom management needs - feels comfortable, denies tightness in abdomen or shortness of breath. Very poor appetite - we discussed strategies for increasing intake.   Family and patient all agree patient's spouse Laban Emperor would make medical decisions for patient if she were unable.   I attempted to elicit values and goals of care important to the patient.    The difference between aggressive medical intervention and comfort care was considered in light of the patient's goals of care.   We discussed code status and intubation - initially patient tells me about her mother being sick and how she made the decision not to have her mother intubated and she shares she would not want to be intubated either. Family shares they would want patient to be intubated. Family shares they have discussed this topic in the past and during those conversations they understood that patient was open to CPR and short term intubation. Family requests patient remain full code and patient agrees. Patient falls asleep often during conversation and capacity difficult to discern.   Discussed with family the importance of continued conversation with family and the medical providers regarding overall plan of care and treatment options, ensuring decisions are within the context of the patient's  values and GOCs.    Questions and concerns were addressed. The family was encouraged to call with questions or concerns.    Primary Decision Maker NEXT OF KIN - spouse Darrell    SUMMARY OF  RECOMMENDATIONS   - to remain full code/full scope (would not want long term intubation) - patient seems to indicate otherwise but capacity in question and when family states desires for full code/full scope she agrees with them - prior to admission patient very active per their report - current situation is shocking to family, they are working to understand complicated situation and severity of illness - PMT will follow along  Code Status/Advance Care Planning: Full code     Primary Diagnoses: Present on Admission:  Descending thoracic aortic dissection (HCC)   I have reviewed the medical record, interviewed the patient and family, and examined the patient. The following aspects are pertinent.  Past Medical History:  Diagnosis Date   Embolic stroke (HCC)    a. 09/2019 MRI: several punctate acute infarcts in R parietal lobe affecting the cortical and subcortical brain consistent w/ micro embolic infarcts in R MCA branch vessel distribution.   Hyperlipidemia    Hypertension    Persistent atrial fibrillation (HCC)    a. 09/2019 Dx in setting of ED visit for weakness-->embolic stroke; b. CHA2DS2VASc = 6-->eliquis; c. 09/2019 Echo: EF 50-55%, no rwma, mild LVH. Nl RV size/fxn. Mild LAE, mod RAE. Mild to mod MR.   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    Social Determinants of Health    Financial Resource Strain: Not on file  Food Insecurity: No Food Insecurity (01/19/2023)   Hunger Vital Sign    Worried About Running Out of Food in the Last Year: Never true    Ran Out of Food in the Last Year: Never true  Transportation Needs: No Transportation Needs (01/19/2023)   PRAPARE - Administrator, Civil Service (Medical): No    Lack of Transportation (Non-Medical): No  Physical Activity: Not on file  Stress: Not on file  Social Connections: Not on file   Family History  Problem Relation Age of Onset   High blood pressure Mother    Hypertension Brother    Scheduled Meds:  amLODipine  10 mg Oral Daily   aspirin  81 mg Oral Daily   atorvastatin  40 mg Oral Daily   bethanechol  10 mg Oral TID   carvedilol  6.25 mg Oral BID WC   Chlorhexidine Gluconate Cloth  6 each Topical Daily   feeding supplement  237 mL Oral TID BM   influenza vaccine adjuvanted  0.5 mL Intramuscular Tomorrow-1000   insulin aspart  0-9 Units Subcutaneous Q4H   pneumococcal 20-valent conjugate vaccine  0.5 mL Intramuscular Tomorrow-1000   polyethylene  glycol  17 g Oral BID   sodium bicarbonate  650 mg Oral TID   Continuous Infusions:  esmolol Stopped (01/22/23 0957)   PRN Meds:.docusate sodium No Known Allergies Review of Systems  Constitutional:  Positive for activity change, appetite change and fatigue.    Physical Exam Constitutional:      General: She is not in acute distress.    Appearance: She is ill-appearing.     Comments: somnolent  Pulmonary:     Effort: Pulmonary effort is normal.  Skin:    General: Skin is warm and dry.     Vital Signs: BP (!) 92/55   Pulse 80   Temp 97.9 F (36.6 C) (Oral)   Resp 19   Ht 5\' 3"  (1.6 m)   Wt 63.6 kg   LMP  (LMP Unknown)   SpO2 94%   BMI 24.84 kg/m  Pain Scale: 0-10   Pain Score: 0-No pain   SpO2: SpO2: 94 % O2 Device:SpO2: 94 % O2 Flow Rate: .O2 Flow Rate (L/min): 3 L/min  IO: Intake/output summary:   Intake/Output Summary (Last 24 hours) at 01/22/2023 1529 Last data filed at 01/22/2023 1000 Gross per 24 hour  Intake 1374.3 ml  Output 1150 ml  Net 224.3 ml    LBM: Last BM Date : 01/18/23 Baseline Weight: Weight: 68.7 kg Most recent weight: Weight: 63.6 kg     Palliative Assessment/Data: PPS 40%     *Please note that this is a verbal dictation therefore any spelling or grammatical errors are due to the "Dragon Medical One" system interpretation.   Time Total: 60 minutes Time spent includes: Detailed review of medical records (labs, imaging, vital signs), medically appropriate exam, discussion with treatment team, counseling and educating patient, family and/or staff, documenting clinical information, medication management and coordination of care.    Gerlean Ren, DNP, AGNP-C Palliative Medicine Team [phone] Pager: [phone]

## 2023-01-22 NOTE — Plan of Care (Signed)
  Problem: Education: Goal: Knowledge of General Education information will improve Description: Including pain rating scale, medication(s)/side effects and non-pharmacologic comfort measures Outcome: Progressing   Problem: Health Behavior/Discharge Planning: Goal: Ability to manage health-related needs will improve Outcome: Progressing   Problem: Clinical Measurements: Goal: Ability to maintain clinical measurements within normal limits will improve Outcome: Progressing Goal: Will remain free from infection Outcome: Progressing Goal: Diagnostic test results will improve Outcome: Progressing Goal: Respiratory complications will improve Outcome: Progressing Goal: Cardiovascular complication will be avoided Outcome: Progressing   Problem: Activity: Goal: Risk for activity intolerance will decrease Outcome: Progressing   Problem: Nutrition: Goal: Adequate nutrition will be maintained Outcome: Progressing   Problem: Elimination: Goal: Will not experience complications related to bowel motility Outcome: Progressing   Problem: Coping: Goal: Level of anxiety will decrease Outcome: Progressing

## 2023-01-22 NOTE — Progress Notes (Signed)
Pt hypotensive 81/47 map 58. Selmer Dominion NP aware. Received orders and carried out.

## 2023-01-22 NOTE — Consult Note (Addendum)
Cardiology Consultation   Patient ID: Dekyra Cardo MRN: [id number]; DOB: December 22, 1945  Admit date: 01/19/2023 Date of Consult: 01/22/2023  PCP:  Patient, No Pcp Per   [facility] Providers Cardiologist:  Lorine Bears, MD saw once in the hospital 2021  Patient Profile:   Shauntrell Simental is a 77 y.o. female with a hx of CVA 2021, HTN, HLD, persistent atrial fib on Eliquis, essential thrombocytosis JAK2+ (prev on Hydrea), who is being seen 01/22/2023 for the evaluation of abnormal echo (eval for amyloid) at the request of Dr Chestine Spore.  History of Present Illness:   Ms. Obryant was lost to f/u by Cards and by Hematology for the last few years.  She stopped seeing Hematology because she wanted to try a holistic approach.  She came to the hospital 11/18 for weakness, intermittent abd pain and chest pain. Imaging revealed an aortic intramural hematoma at level of arch extending down to around the renal arteries. Unclear if she was taking A/C, but it was held.  VVS saw, in today's note, there was no interval change in the hematoma, no sx, Cr higher, WBCs higher and Palliative Care consult recommended.  She was seen by Oncology for WBCs 50.3 and Plt 1,315 on admission. No bleeding issues. Oncology recommended not restarting Hydrea as an inpt and f/u with her regular Hematologist after d/c.  As part of her eval, she had an echo which was concerning for amyloid, and her initial CXR showed CHF, Cards asked to see.   Ms. Ericksen says that she would agree to take medications as recommended by cardiology.  She says that her shortness of breath has been a little worse than usual off and on.  She wakes up occasionally at night due to shortness of breath, cannot tell me how often.  She is not sure how long the lower extremity edema has been going on.   Her abdomen is distended and protuberant, she is not sure how long it has been like that.  She walks regularly, and has a  part-time job 2 days a week.  Basically, she says she just ignores what ever is going on and goes about her day.   Past Medical History:  Diagnosis Date   Embolic stroke (HCC)    a. 09/2019 MRI: several punctate acute infarcts in R parietal lobe affecting the cortical and subcortical brain consistent w/ micro embolic infarcts in R MCA branch vessel distribution.   Hyperlipidemia    Hypertension    Persistent atrial fibrillation (HCC)    a. 09/2019 Dx in setting of ED visit for weakness-->embolic stroke; b. CHA2DS2VASc = 6-->eliquis; c. 09/2019 Echo: EF 50-55%, no rwma, mild LVH. Nl RV size/fxn. Mild LAE, mod RAE. Mild to mod MR.   Thrombocythemia, essential (HCC)    JAK2 positive    History reviewed. No pertinent surgical history.   Home Medications:  Prior to Admission medications   Medication Sig Start Date End Date Taking? Authorizing Provider  Cod Liver Oil (COD LIVER PO) Take 1 capsule by mouth daily.   Yes [provider]  Iron-Vitamins (GERITOL PO) Take 1 tablet by mouth daily.   Yes [provider]  Multiple Vitamins-Minerals (MULTIVITAMIN WITH MINERALS) tablet Take 1 tablet by mouth daily.   Yes [provider]    Inpatient Medications: Scheduled Meds:  amLODipine  10 mg Oral Daily   aspirin  81 mg Oral Daily   atorvastatin  40 mg Oral Daily   bethanechol  10 mg Oral TID  carvedilol  6.25 mg Oral BID WC   Chlorhexidine Gluconate Cloth  6 each Topical Daily   feeding supplement  237 mL Oral TID BM   influenza vaccine adjuvanted  0.5 mL Intramuscular Tomorrow-1000   insulin aspart  0-9 Units Subcutaneous Q4H   pneumococcal 20-valent conjugate vaccine  0.5 mL Intramuscular Tomorrow-1000   polyethylene glycol  17 g Oral BID   sodium bicarbonate  650 mg Oral TID   Continuous Infusions:  esmolol Stopped (01/22/23 0957)   PRN Meds: docusate sodium  Allergies:   No Known Allergies  Social History:   Social History   Socioeconomic History    Marital status: Married    Spouse name: Administrator, arts   Number of children: 3   Years of education: Not on file   Highest education level: Not on file  Occupational History   Not on file  Tobacco Use   Smoking status: Never   Smokeless tobacco: Never  Vaping Use   Vaping status: Never Used  Substance and Sexual Activity   Alcohol use: Never   Drug use: Never   Sexual activity: Never  Other Topics Concern   Not on file  Social History Narrative   Never smoked; no alcohol; used to [employer]; 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    Social Determinants of Health   Financial Resource Strain: Not on file  Food Insecurity: No Food Insecurity (01/19/2023)   Hunger Vital Sign    Worried About Running Out of Food in the Last Year: Never true    Ran Out of Food in the Last Year: Never true  Transportation Needs: No Transportation Needs (01/19/2023)   PRAPARE - Administrator, Civil Service (Medical): No    Lack of Transportation (Non-Medical): No  Physical Activity: Not on file  Stress: Not on file  Social Connections: Not on file  Intimate Partner Violence: Not At Risk (01/19/2023)   Humiliation, Afraid, Rape, and Kick questionnaire    Fear of Current or Ex-Partner: No    Emotionally Abused: No    Physically Abused: No    Sexually Abused: No    Family History:   Family History  Problem Relation Age of Onset   High blood pressure Mother    Hypertension Brother      ROS:  Please see the history of present illness.  All other ROS reviewed and negative.     Physical Exam/Data:   Vitals:   01/22/23 1140 01/22/23 1145 01/22/23 1200 01/22/23 1215  BP:  93/65 92/63 96/61   Pulse: 83 83 74 80  Resp: 19 18 20 16   Temp: 97.9 F (36.6 C)     TempSrc: Oral     SpO2: 95% 95% 96% 94%  Weight:      Height:        Intake/Output Summary (Last 24 hours) at 01/22/2023 1336 Last data filed at 01/22/2023 1000 Gross per 24 hour  Intake 1448.75 ml  Output 1150 ml   Net 298.75 ml      01/22/2023    7:00 AM 01/21/2023    4:25 AM 01/20/2023    2:10 AM  Last 3 Weights  Weight (lbs) 140 lb 3.2 oz 154 lb 5.2 oz 151 lb 7.3 oz  Weight (kg) 63.594 kg 70 kg 68.7 kg     Body mass index is 24.84 kg/m.  General:  Well nourished, well developed, in no acute distress HEENT: normal Neck: JVD to jaw Vascular: No carotid bruits; Distal pulses  2+ bilaterally Cardiac:  normal S1, S2; RRR; soft murmur  Lungs: rales bases bilaterally, no wheezing, rhonchi Abd: distended, firm, mildly tender  Ext: no edema Musculoskeletal:  No deformities, BUE and BLE strength weak but equal Skin: warm and dry  Neuro:  CNs 2-12 intact, no focal abnormalities noted Psych:  Normal affect   EKG:  The EKG was personally reviewed and demonstrates:  atrial fib, HR 105, inf Q waves are new Telemetry:  Telemetry was personally reviewed and demonstrates:  atrial fib, RVR at times  Relevant CV Studies:  ECHO: 01/21/2023  1. Significant beat to beat variability due to atrial fibrillation.   2. Left ventricular ejection fraction, by estimation, is 45%. The left ventricle has mildly decreased function. The left ventricle has no regional wall motion abnormalities. There is severe concentric left ventricular hypertrophy. Consider CMR to rule  out infiltrative cardiomyoathy (i.e amyloid).   3. Right ventricular systolic function is mildly reduced. The right  ventricular size is mildly enlarged.   4. Left atrial size was moderately dilated.   5. Right atrial size was severely dilated.   6. A small pericardial effusion is present. Moderate pleural effusion in the left lateral region.   7. The mitral valve is normal in structure. No evidence of mitral valve regurgitation. No evidence of mitral stenosis.   8. Tricuspid valve regurgitation is moderate.   9. The aortic valve is normal in structure. There is mild calcification of the aortic valve. There is mild thickening of the aortic valve.  Aortic valve regurgitation is not visualized. No aortic stenosis is present.  10. The inferior vena cava is dilated in size with <50% respiratory variability, suggesting right atrial pressure of 15 mmHg. There is systolic reversal of hepatic vein flow.  11. No obvious aortic dissection noted on limited views. There is some thickening around the abdominal aorta possibly due to intramural hematoma.   ECHO: 09/30/2019  1. Left ventricular ejection fraction, by estimation, is 50 to 55%. The left ventricle has low normal function. The left ventricle has no regional wall motion abnormalities. There is mild left ventricular hypertrophy. Left ventricular diastolic  parameters are indeterminate.   2. Right ventricular systolic function is normal. The right ventricular size is normal. There is mildly elevated pulmonary artery systolic pressure.   3. Left atrial size was mildly dilated.   4. Right atrial size was moderately dilated.   5. The mitral valve is normal in structure. Mild to moderate mitral valve regurgitation. No evidence of mitral stenosis.   6. The aortic valve is normal in structure. Aortic valve regurgitation is not visualized. No aortic stenosis is present.   Laboratory Data:  High Sensitivity Troponin:   Recent Labs  Lab 01/19/23 1125 01/19/23 1349  TROPONINIHS 81* 63*     Chemistry Recent Labs  Lab 01/19/23 2229 01/20/23 0257 01/21/23 0233 01/22/23 0252  NA 140 139 136 134*  K 3.9 3.8 4.1 4.5  CL 109 107 107 107  CO2 21* 23 19* 15*  GLUCOSE 120* 134* 108* 106*  BUN 15 15 24* 34*  CREATININE 1.41* 1.50* 1.96* 2.29*  CALCIUM 9.3 9.3 8.9 8.9  MG 2.1 2.0 2.3  --   GFRNONAA 38* 35* 26* 21*  ANIONGAP 10 9 10 12     Recent Labs  Lab 01/19/23 2229 01/22/23 0252 01/22/23 1124  PROT 5.7*  --  5.4*  ALBUMIN 3.2* 2.6*  --   AST 29  --   --   ALT 23  --   --  ALKPHOS 94  --   --   BILITOT 2.4*  --   --    Lipids  Recent Labs  Lab 01/20/23 0257  CHOL 154  TRIG 60   HDL 53  LDLCALC 89  CHOLHDL 2.9    Hematology Recent Labs  Lab 01/20/23 0257 01/21/23 0233 01/22/23 0252  WBC 41.6* 38.5* 41.6*  RBC 3.68* 3.70* 3.67*  HGB 11.1* 11.2* 11.4*  HCT 35.0* 35.4* 35.2*  MCV 95.1 95.7 95.9  MCH 30.2 30.3 31.1  MCHC 31.7 31.6 32.4  RDW 16.0* 16.1* 16.3*  PLT 1,097* 1,045* 1,046*   Thyroid No results for input(s): "TSH", "FREET4" in the last 168 hours.  BNP Recent Labs  Lab 01/19/23 1125  BNP 597.5*    DDimer No results for input(s): "DDIMER" in the last 168 hours.   Radiology/Studies:  CT Angio Chest/Abd/Pel for Dissection W and/or W/WO  Result Date: 01/21/2023 CLINICAL DATA:  Follow-up thoracic aortic intramural hematoma EXAM: CT ANGIOGRAPHY CHEST, ABDOMEN AND PELVIS TECHNIQUE: Non-contrast CT of the chest was initially obtained. Multidetector CT imaging through the chest, abdomen and pelvis was performed using the standard protocol during bolus administration of intravenous contrast. Multiplanar reconstructed images and MIPs were obtained and reviewed to evaluate the vascular anatomy. RADIATION DOSE REDUCTION: This exam was performed according to the departmental dose-optimization program which includes automated exposure control, adjustment of the mA and/or kV according to patient size and/or use of iterative reconstruction technique. CONTRAST:  75mL OMNIPAQUE IOHEXOL 350 MG/ML SOLN COMPARISON:  CT chest angiogram, 01/19/2023 FINDINGS: CTA CHEST FINDINGS VASCULAR Aorta: Satisfactory opacification of the aorta. Unchanged contour and caliber of the descending thoracic aorta again with a posterior intramural hematoma arising just distal to the left subclavian artery origin. Maximum caliber of the proximal descending thoracic aorta 3.8 x 3.8 cm (series 7, image 55). Cardiovascular: No evidence of pulmonary embolism on limited non-tailored examination. Gross cardiomegaly. Unchanged small pericardial effusion. Review of the MIP images confirms the above  findings. NON VASCULAR Mediastinum/Nodes: No enlarged mediastinal, hilar, or axillary lymph nodes. Thyroid gland, trachea, and esophagus demonstrate no significant findings. Lungs/Pleura: Moderate left, small right pleural effusions, increased in volume compared to prior examination. Musculoskeletal: No chest wall abnormality. No acute osseous findings. Review of the MIP images confirms the above findings. CTA ABDOMEN AND PELVIS FINDINGS VASCULAR Intramural hematoma extends into the upper abdominal aorta, to approximately the level of the celiac origin, maximum caliber of the vessel in this vicinity 3.3 x 3.1 cm (series 7, image 134). Standard branching pattern of the abdominal aorta with solitary bilateral renal arteries. Review of the MIP images confirms the above findings. NON-VASCULAR Hepatobiliary: No solid liver abnormality is seen. No gallstones, gallbladder wall thickening, or biliary dilatation. Pancreas: Unremarkable. No pancreatic ductal dilatation or surrounding inflammatory changes. Spleen: Mild splenomegaly, maximum span 13.3 cm. Adrenals/Urinary Tract: Adrenal glands are unremarkable. Under rotated kidneys. Kidneys are otherwise normal, without renal calculi, solid lesion, or hydronephrosis. Bladder is unremarkable. Stomach/Bowel: Stomach is within normal limits. Appendix not clearly visualized. No evidence of bowel wall thickening, distention, or inflammatory changes. Lymphatic: No enlarged abdominal or pelvic lymph nodes. Reproductive: Uterine fibroids. Other: Small fat containing midline epigastric hernia (series 7, image 185). Severe anasarca. Small volume perihepatic and perisplenic ascites. Musculoskeletal: No acute osseous findings. IMPRESSION: 1. Unchanged contour and caliber of the descending thoracic aorta again with a posterior intramural hematoma arising just distal to the left subclavian artery origin. Maximum caliber of the proximal descending thoracic aorta 3.8 x 3.8  cm. 2. Intramural  hematoma extends into the upper abdominal aorta, to approximately the level of the celiac origin, maximum caliber of the vessel in this vicinity 3.3 x 3.1 cm. 3. Gross cardiomegaly. Unchanged small pericardial effusion. 4. Moderate left, small right pleural effusions, increased in volume compared to prior examination. 5. Severe anasarca and small volume ascites. 6. Mild splenomegaly. 7. Uterine fibroids. Aortic Atherosclerosis (ICD10-I70.0). Electronically Signed   By: Jearld Lesch M.D.   On: 01/21/2023 14:04   ECHOCARDIOGRAM COMPLETE  Result Date: 01/21/2023    ECHOCARDIOGRAM REPORT   Patient Name:   AAREN MCINTOSH Date of Exam: 01/21/2023 Medical Rec #:  [id number]        Height:       63.0 in Accession #:    [id number]       Weight:       154.3 lb Date of Birth:  01/30/1946        BSA:          1.732 m Patient Age:    77 years         BP:           97/59 mmHg Patient Gender: F                HR:           65 bpm. Exam Location:  Inpatient Procedure: 2D Echo, Cardiac Doppler and Color Doppler Indications:    Aortic dissection [facility])  History:        Patient has prior history of Echocardiogram examinations, most                 recent 09/30/2019. Stroke, Arrythmias:Atrial Fibrillation; Risk                 Factors:Hypertension.  Sonographer:    Darlys Gales Referring Phys: [phone] PAULA B SIMPSON IMPRESSIONS  1. Significant beat to beat variability due to atrial fibrillation.  2. Left ventricular ejection fraction, by estimation, is 45%. The left ventricle has mildly decreased function. The left ventricle has no regional wall motion abnormalities. There is severe concentric left ventricular hypertrophy. Consider CMR to rule out infiltrative cardiomyoathy (i.e amyloid).  3. Right ventricular systolic function is mildly reduced. The right ventricular size is mildly enlarged.  4. Left atrial size was moderately dilated.  5. Right atrial size was severely dilated.  6. A small pericardial effusion is present.  Moderate pleural effusion in the left lateral region.  7. The mitral valve is normal in structure. No evidence of mitral valve regurgitation. No evidence of mitral stenosis.  8. Tricuspid valve regurgitation is moderate.  9. The aortic valve is normal in structure. There is mild calcification of the aortic valve. There is mild thickening of the aortic valve. Aortic valve regurgitation is not visualized. No aortic stenosis is present. 10. The inferior vena cava is dilated in size with <50% respiratory variability, suggesting right atrial pressure of 15 mmHg. There is systolic reversal of hepatic vein flow. 11. No obvious aortic dissection noted on limited views. There is some thickening around the abdominal aorta possibly due to intramural hematoma. FINDINGS  Left Ventricle: Left ventricular ejection fraction, by estimation, is 45 to 50%. The left ventricle has mildly decreased function. The left ventricle has no regional wall motion abnormalities. The left ventricular internal cavity size was normal in size. There is severe concentric left ventricular hypertrophy. Left ventricular diastolic function could not be evaluated due to atrial fibrillation. Left ventricular diastolic function could  not be evaluated. Right Ventricle: The right ventricular size is mildly enlarged. No increase in right ventricular wall thickness. Right ventricular systolic function is mildly reduced. Left Atrium: Left atrial size was moderately dilated. Right Atrium: Right atrial size was severely dilated. Pericardium: A small pericardial effusion is present. Mitral Valve: The mitral valve is normal in structure. No evidence of mitral valve regurgitation. No evidence of mitral valve stenosis. Tricuspid Valve: The tricuspid valve is normal in structure. Tricuspid valve regurgitation is moderate . No evidence of tricuspid stenosis. Aortic Valve: The aortic valve is normal in structure. There is mild calcification of the aortic valve. There is  mild thickening of the aortic valve. Aortic valve regurgitation is not visualized. No aortic stenosis is present. Aortic valve mean gradient measures 5.0 mmHg. Aortic valve peak gradient measures 8.0 mmHg. Aortic valve area, by VTI measures 2.17 cm. Pulmonic Valve: The pulmonic valve was normal in structure. Pulmonic valve regurgitation is not visualized. No evidence of pulmonic stenosis. Aorta: The aortic root is normal in size and structure. Venous: The inferior vena cava is dilated in size with less than 50% respiratory variability, suggesting right atrial pressure of 15 mmHg. The inferior vena cava and the hepatic vein show a pattern of systolic flow reversal, suggestive of tricuspid regurgitation. IAS/Shunts: No atrial level shunt detected by color flow Doppler. Additional Comments: There is a moderate pleural effusion in the left lateral region.  LEFT VENTRICLE PLAX 2D LVIDd:         4.00 cm   Diastology LVIDs:         3.10 cm   LV e' medial:    8.27 cm/s LV PW:         1.40 cm   LV E/e' medial:  13.4 LV IVS:        1.50 cm   LV e' lateral:   11.30 cm/s LVOT diam:     1.80 cm   LV E/e' lateral: 9.8 LV SV:         51 LV SV Index:   29 LVOT Area:     2.54 cm  RIGHT VENTRICLE            IVC RV S prime:     9.14 cm/s  IVC diam: 3.00 cm TAPSE (M-mode): 1.2 cm LEFT ATRIUM              Index        RIGHT ATRIUM           Index LA Vol (A2C):   119.0 ml 68.71 ml/m  RA Area:     35.80 cm LA Vol (A4C):   77.2 ml  44.58 ml/m  RA Volume:   141.00 ml 81.41 ml/m LA Biplane Vol: 101.0 ml 58.32 ml/m  AORTIC VALVE AV Area (Vmax):    2.00 cm AV Area (Vmean):   2.08 cm AV Area (VTI):     2.17 cm AV Vmax:           141.00 cm/s AV Vmean:          100.000 cm/s AV VTI:            0.234 m AV Peak Grad:      8.0 mmHg AV Mean Grad:      5.0 mmHg LVOT Vmax:         111.00 cm/s LVOT Vmean:        81.600 cm/s LVOT VTI:          0.200 m LVOT/AV  VTI ratio: 0.85  AORTA Ao Root diam: 2.50 cm Ao Asc diam:  3.30 cm MITRAL VALVE                 TRICUSPID VALVE MV Area (PHT): 3.02 cm     TR Peak grad:   28.9 mmHg MV Decel Time: 251 msec     TR Vmax:        269.00 cm/s MV E velocity: 111.00 cm/s                             SHUNTS                             Systemic VTI:  0.20 m                             Systemic Diam: 1.80 cm Aditya Sabharwal Electronically signed by Dorthula Nettles Signature Date/Time: 01/21/2023/1:24:08 PM    Final    CT Angio Chest/Abd/Pel for Dissection W and/or Wo Contrast  Addendum Date: 01/19/2023   ADDENDUM REPORT: 01/19/2023 16:57 ADDENDUM: On further review, the origin of intramural hemorrhage is visible, especially on the sagittal reconstructions with a focal ulceration along the superior aspect of the aortic arch located approximately 1.3 cm distal to the left subclavian origin. This likely is the origin of the intramural hemorrhage which then progressed distally in the thoracic aorta and into the proximal abdominal aorta. Electronically Signed   By: Irish Lack M.D.   On: 01/19/2023 16:57   Result Date: 01/19/2023 CLINICAL DATA:  Suggestion of possible aortic dissection/intramural hemorrhage of the aorta on CT of the abdomen and pelvis earlier today. EXAM: CT ANGIOGRAPHY CHEST, ABDOMEN AND PELVIS TECHNIQUE: Non-contrast CT of the chest was initially obtained. Multidetector CT imaging through the chest, abdomen and pelvis was performed using the standard protocol during bolus administration of intravenous contrast. Multiplanar reconstructed images and MIPs were obtained and reviewed to evaluate the vascular anatomy. RADIATION DOSE REDUCTION: This exam was performed according to the departmental dose-optimization program which includes automated exposure control, adjustment of the mA and/or kV according to patient size and/or use of iterative reconstruction technique. CONTRAST:  75mL OMNIPAQUE IOHEXOL 350 MG/ML SOLN COMPARISON:  CT of the abdomen and pelvis earlier today. 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 Component of acute intramural hemorrhage is suspected beginning at roughly the aortic isthmus and continuing into the descending thoracic aorta. Maximum caliber of the distal arch including the estimated component of intramural hemorrhage is 3.9 cm. Maximum caliber of the descending thoracic aorta including intramural hemorrhage is approximately 3.7 cm. There also is a component additional adjacent crescentic fluid along the left lateral aspect of the descending thoracic aorta which appears to be separate from the intramural hemorrhage. The presence of some crescentic fluid could be related to some extraluminal bleeding but is not large. No overt intimal flap is present within the opacified lumen of the thoracic aorta. The intramural hemorrhage does not appear to affect the ascending thoracic aorta or great vessel origins at this time. The heart is substantially enlarged. There is a small amount of pericardial fluid. No significant calcified coronary artery plaque. Central pulmonary arteries are dilated with the main pulmonary artery measuring up  to 3.4 cm. Mediastinum/Nodes: No enlarged mediastinal, hilar, or axillary lymph nodes. Thyroid gland, trachea, and esophagus demonstrate no significant findings. Lungs/Pleura: Trace left pleural fluid. Left lower lung atelectasis. No overt pulmonary edema. No pneumothorax. Musculoskeletal: No chest wall abnormality. No acute or significant osseous findings. Review of the MIP images confirms the above findings. CTA ABDOMEN AND PELVIS FINDINGS VASCULAR Aorta: As suspected on the abdominal CT, the intramural hemorrhage of the descending thoracic aorta likely continues into the proximal abdominal aorta but likely ends before origins of the renal arteries. No intimal flap is noted in the abdominal aorta. The abdominal  aorta is not aneurysmal. Celiac: Normally patent.  Normally patent branch vessels. SMA: Normally patent. Renals: Normally patent single left renal artery. There are 3 separate normally patent right renal arteries. IMA: Normally patent. Inflow: Normally patent bilateral iliac arteries without aneurysm, stenosis or dissection. Review of the MIP images confirms the above findings. NON-VASCULAR Hepatobiliary: Stable appearance of liver and gallbladder. No biliary ductal dilatation. Pancreas: Unremarkable. No pancreatic ductal dilatation or surrounding inflammatory changes. Spleen: Stable splenic enlargement. The previously noted anterior splenic lesion is not well appreciated on the arterial phase. 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 Bladder contains excreted contrast related to the prior CT. Stomach/Bowel: No bowel obstruction, inflammation or free intraperitoneal air. Lymphatic: No enlarged lymph nodes identified. Reproductive: Enlarged uterus demonstrating degenerated and calcified fibroids. Other: Stable small ventral hernia. Body wall anasarca without significant ascites or focal abnormal fluid collection. Musculoskeletal: No acute or significant osseous findings. Review of the MIP images confirms the above findings. IMPRESSION: 1. 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 Component of acute intramural hemorrhage is suspected beginning at roughly the aortic isthmus just beyond the left subclavian artery origin and continuing into the descending thoracic aorta. Maximum caliber of the distal arch including the estimated component of intramural hemorrhage is 3.9 cm. Maximum caliber of the descending thoracic aorta including intramural hemorrhage is approximately 3.7 cm. There also is a component of additional adjacent crescentic fluid along the left lateral aspect of  the descending thoracic aorta which appears to be separate from the intramural hemorrhage. The presence of some crescentic fluid could be related to some extraluminal bleeding but is not large. This could also be related to reactive fluid. No overt intimal flap is present within the opacified lumen of the thoracic aorta. The intramural hemorrhage does not appear to affect the ascending thoracic aorta or great vessel origins at this time. 2. As suspected on the abdominal CT, the intramural hemorrhage of the descending thoracic aorta likely continues into the proximal abdominal aorta but likely ends before origins of the renal arteries. Intramural hemorrhage does not compromise patency of the celiac axis or SMA. 3. Substantial cardiomegaly with small amount of pericardial fluid. 4. Dilated central pulmonary arteries suggesting pulmonary arterial hypertension. 5. Trace left pleural fluid with left lower lung atelectasis. 6. Stable splenic enlargement. The previously noted anterior splenic lesion is not well appreciated on the arterial phase. 7. 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 8. Enlarged uterus demonstrating degenerated and calcified fibroids. 9. Stable small ventral hernia. 10. Body wall anasarca without significant ascites or focal abnormal fluid collection. 11. These results were called by telephone at the time of interpretation on 01/19/2023 at 4:50PM to provider Dr. Fuller Plan, who verbally acknowledged these results. Electronically Signed: By: Irish Lack M.D. On: 01/19/2023 16:50  US Venous Img Lower Bilateral  Result Date: 01/19/2023 CLINICAL DATA:  77 year old female with bilateral lower extremity edema. EXAM: BILATERAL LOWER EXTREMITY VENOUS DOPPLER ULTRASOUND TECHNIQUE: Gray-scale sonography with graded compression, as well as color Doppler and duplex ultrasound were performed to evaluate the lower extremity deep venous systems from the level of the common femoral  vein and including the common femoral, femoral, profunda femoral, popliteal and calf veins including the posterior tibial, peroneal and gastrocnemius veins when visible. The superficial great saphenous vein was also interrogated. Spectral Doppler was utilized to evaluate flow at rest and with distal augmentation maneuvers in the common femoral, femoral and popliteal veins. COMPARISON:  None Available. FINDINGS: RIGHT LOWER EXTREMITY Common Femoral Vein: No evidence of thrombus. Normal compressibility, respiratory phasicity and response to augmentation. Saphenofemoral Junction: No evidence of thrombus. Normal compressibility and flow on color Doppler imaging. Profunda Femoral Vein: No evidence of thrombus. Normal compressibility and flow on color Doppler imaging. Femoral Vein: No evidence of thrombus. Normal compressibility, respiratory phasicity and response to augmentation. Popliteal Vein: No evidence of thrombus. Normal compressibility, respiratory phasicity and response to augmentation. Calf Veins: No evidence of thrombus. Normal compressibility and flow on color Doppler imaging. Other Findings:  None. LEFT LOWER EXTREMITY Common Femoral Vein: No evidence of thrombus. Normal compressibility, respiratory phasicity and response to augmentation. Saphenofemoral Junction: No evidence of thrombus. Normal compressibility and flow on color Doppler imaging. Profunda Femoral Vein: No evidence of thrombus. Normal compressibility and flow on color Doppler imaging. Femoral Vein: No evidence of thrombus. Normal compressibility, respiratory phasicity and response to augmentation. Popliteal Vein: No evidence of thrombus. Normal compressibility, respiratory phasicity and response to augmentation. Calf Veins: No evidence of thrombus. Normal compressibility and flow on color Doppler imaging. Other Findings:  None. IMPRESSION: No evidence of bilateral lower extremity deep venous thrombosis. Marliss Coots, MD [facility] Electronically Signed   By: Marliss Coots M.D.   On: 01/19/2023 14:58   CT ABDOMEN PELVIS W CONTRAST  Addendum Date: 01/19/2023   ADDENDUM REPORT: 01/19/2023 14:55 ADDENDUM: Critical Value/emergent results were called by telephone at the time of interpretation on 01/19/2023 at 2:55 pm to provider DAVID WELLS , who verbally acknowledged these results. Electronically Signed   By: Jules Schick M.D.   On: 01/19/2023 14:55   Result Date: 01/19/2023 CLINICAL DATA:  Generalized abdominal tenderness to palpation. Prior history of hernia. Concern for obstruction EXAM: CT ABDOMEN AND PELVIS WITH CONTRAST TECHNIQUE: Multidetector CT imaging of the abdomen and pelvis was performed using the standard protocol following bolus administration of intravenous contrast. RADIATION DOSE REDUCTION: This exam was performed according to the departmental dose-optimization program which includes automated exposure control, adjustment of the mA and/or kV according to patient size and/or use of iterative reconstruction technique. CONTRAST:  OMNIPAQUE IOHEXOL 300 MG/ML  SOLN COMPARISON:  None Available. FINDINGS: Lower chest: There is small left and trace right pleural effusion. There are associated atelectatic changes at the lung bases. No consolidation or lung mass. There is mild-to-moderate cardiomegaly. Partially seen at least mild pericardial effusion. Hepatobiliary: The liver is normal in size. Non-cirrhotic configuration. There is heterogeneous attenuation of the liver, predominantly right hepatic lobe, which may be due to heterogeneous hepatic steatosis. No discrete suspicious mass seen within the limitations of this exam. No intrahepatic or extrahepatic bile duct dilation. No calcified gallstones. Normal gallbladder wall thickness. No pericholecystic inflammatory changes. Pancreas: Unremarkable. No pancreatic ductal dilatation or surrounding inflammatory changes.  Spleen: There is a  well-circumscribed heterogeneous, mixed hypoattenuating and hyperattenuating 4.9 x 5.9 cm lesion in the anterior aspect of the spleen. This is incompletely characterized on the current exam. Adrenals/Urinary Tract: Adrenal glands are unremarkable. Malrotated bilateral kidneys noted. There are hypoattenuating areas in the left kidney lower pole, reaching up to the cortical surface. The differential diagnosis includes renal infarction versus pyelonephritis. There are at least 4, subcentimeter sized nonobstructing calculi in the right kidney. No nephroureterolithiasis on the left side. No hydroureteronephrosis on either side. Note is made of bilateral extrarenal pelvis. Unremarkable urinary bladder. Stomach/Bowel: No disproportionate dilation of the small or large bowel loops. No evidence of abnormal bowel wall thickening or inflammatory changes. The appendix is unremarkable. Vascular/Lymphatic: There is small ascites no pneumoperitoneum. No abdominal or pelvic lymphadenopathy, by size criteria. No aneurysmal dilation of the major abdominal arteries. There are mild peripheral atherosclerotic vascular calcifications of the aorta and its major branches. There is crescentic filling defect along the left/posterior wall of the lower thoracic aorta extending inferiorly into the abdomen up to the level of origin of the inferior mesenteric artery. This is nonspecific and differential diagnosis includes thrombosed false lumen in aortic dissection, intramural hematoma or intraluminal thrombus/hematoma, etc. However, there is probable focal dissection flap in the proximal left renal artery (series 3, image 25), favoring aortic dissection. Further imaging of chest with dissection protocol (before and after intravenous contrast) is recommended. There is asymmetric dilated and tortuous left-sided parametrial/gonadal veins, which is nonspecific but in appropriate clinical setting can be seen with pelvic congestion  syndrome. Reproductive: Enlarged bulky uterus containing multiple hyperattenuating and calcified masses, favored to represent leiomyomas. No large adnexal mass seen. Other: There is small right paramedian ventral hernia containing portion of fat and ascitic fluid. No herniation of bowel loops. The soft tissues and abdominal wall are otherwise unremarkable. Musculoskeletal: No suspicious osseous lesions. There are mild - moderate multilevel degenerative changes in the visualized spine. IMPRESSION: 1. There is crescentic filling defect along the left/posterior wall of the lower thoracic aorta extending inferiorly into the abdomen up to the level of origin of inferior mesenteric artery. There is probable dissection flap extending into the proximal left renal artery which exhibit hypoattenuating area in the lower pole, favored to represent renal infarction. 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 Further imaging with CT angiography chest, abdomen and pelvis as per dissection protocol is recommended. 2. There is a small right paramedian ventral hernia containing portion of fat and ascitic fluid. No herniation of bowel loops. No bowel obstruction. 3. Small left pleural effusion and partially seen pericardial effusion. 4. There is a heterogeneous approximately 4.9 x 5.9 cm lesion in the anterior spleen, which is incompletely characterized on the current exam. 5. Multiple other nonacute observations, as described above. Electronically Signed: By: Jules Schick M.D. On: 01/19/2023 14:47   DG Chest Port 1 View  Result Date: 01/19/2023 CLINICAL DATA:  Chest pain.  Weakness. EXAM: PORTABLE CHEST 1 VIEW COMPARISON:  None Available. FINDINGS: There is diffuse pulmonary vascular congestion. There are left retrocardiac opacities, which may represent combination of atelectasis and/or consolidation. Bilateral lung fields are otherwise clear. No dense consolidation or lung  collapse. Apparent blunting of left lateral costophrenic angle may represent pleural effusion versus superimposed soft tissues. Right lateral costophrenic angle is clear. Moderately enlarged cardio-mediastinal silhouette. No acute osseous abnormalities. The soft tissues are within normal limits. IMPRESSION: *Findings favor congestive heart failure/pulmonary edema. Electronically Signed   By: Timoteo Expose.D.  On: 01/19/2023 14:20     Assessment and Plan:   ABNL Echo - possible amyloid - MD to review, treatment TBD in context of her other medical comorbidities  2. Acute diastolic CHF - L pleural effusion seen, per CCM, s/p diagnostic thoracentesis with 450 cc out - has not gotten Lasix, Cr has been climbing since admit  3.  Intramural aortic hematoma - per CTA chest/abd/pelvis:  the origin of intramural hemorrhage is visible,especially on the sagittal reconstructions with a focal ulcerationalong the superior aspect of the aortic arch located approximately1.3 cm distal to the left subclavian origin. This likely is the origin of the intramural hemorrhage which then progressed distally in the thoracic aorta and into the proximal abdominal aorta. - no surgery per [facility] - pt has not yet been cleared to restart anticoag  4. heterogeneous approximately 4.9 x 5.9 cm lesion in the anterior spleen, which is incompletely characterized on CT abd/pelvis w/ contrast - this was not seen on subsequent CT angio chest/abd/pelvis for dissection  5. Hypertension/Hypotension - hx HTN, but SBP has mostly been < 100 this admit - SBP currently 80s, pt mentating well, not on pressors - currently on amlodipine 10 mg every day - was on Coreg 25 mg bid, but stopped after 1 dose this am, dose is now 6.25 mg bid. Had been on metop 50 mg bid >> d/c'd.  6. Persistent (?permanent) atrial fib - HR elevated on admit was on IV esmolol >> d/c'd this am  7.  Anemia and thrombocytopenia, JAK-2 positive, anasarca w/  ascities - abd [place] ordered   Risk Assessment/Risk Scores:       New York Heart Association (NYHA) Functional Class NYHA Class IV  CHA2DS2-VASc Score = 7   This indicates a 11.2% annual risk of stroke. The patient's score is based upon: CHF History: 1 HTN History: 1 Diabetes History: 0 Stroke History: 2 Vascular Disease History: 0 Age Score: 2 Gender Score: 1    For questions or updates, please contact [facility] Please consult [email or web address] for contact info under    Signed, Theodore Demark, PA-C  01/22/2023 1:36 PM  Patient seen and examined with RB PA-C.  Agree as above, with the following exceptions and changes as noted below. Complex situation with patient presenting with weakness and intermittent abdominal and chest pain.  She has a known JAK2 mutation and was found to have aortic intramural hematoma at the level of the arch extending down to the renal arteries.  Vascular surgery consulted, recommendation was for palliative care as surgical management not indicated.  Patient noted to have a leukocytosis as well as a rising creatinine, on presentation blood pressure elevated and esmolol and Cleviprex were initiated for strict blood pressure control in the setting of intramural hematoma of the aorta.  In the last 24 hours Cleviprex has been stopped and esmolol was stopped this morning, she was given beta-blockade and her blood pressure has fallen further, currently measured in the room at 80s over 50s.  This certainly may exacerbate her renal dysfunction. Gen: NAD, CV: RRR, no murmurs, Lungs: clear, Abd: soft, Extrem: Warm, well perfused, no edema, Neuro/Psych: alert and oriented x 3, normal mood and affect. All available labs, radiology testing, previous records reviewed.  Exam grossly normal and patient is resting comfortably.  Multidisciplinary teams postulate that creatinine rising in the setting of hypoperfusion relative to her normal blood pressure, on presentation  to the ED it appears her blood pressure was 165/102.  Consideration for amyloidosis  was proposed, but if the patient has a known history of hypertension not taking her medications and presenting with significant hypertension, the most likely etiology of her LVH is hypertensive heart disease. Independent review of echocardiogram demonstrates borderline ejection fraction with beat-to-beat variability, approximately 45%.  Severe biatrial dilation, moderate RV dysfunction with mild RV dilation.  LVH may be moderate to severe.  Diastolic function reviewed, deceleration time of mitral inflow is 250 ms, E' velocities grossly preserved, suggesting against a restrictive cardiomyopathy and therefore more in line with probable hypertensive heart disease versus amyloidosis of the heart.  Patient has taken a holistic approach to her care in the last several years and has stopped several of her medications as a result.  She remains in atrial fibrillation not on anticoagulation.  With rising creatinine she has also had a decrease in urine output.  It may be reasonable to check a point-of-care lactate at this point to further prognosticate.  With intramural hematoma use of pressors would prove quite challenging if blood pressure rises significantly, would likely want to aim for normotension and guidelines have been placed in the vascular surgery notes for goal blood pressure and heart rate.  Her renal failure was not precipitated by diuresis, she has undergone thoracentesis today for volume management.  At this juncture, further workup for amyloidosis may not be indicated if goals of care discussions are for a more palliative approach.  We will await these discussions with palliative care.  If patient strongly prefers we can consider a cardiac MRI for further evaluation, but treatment options may be limited.  Based on echocardiogram, features seem most consistent with hypertensive heart disease.  Parke Poisson,  MD 01/22/23 8:31 PM

## 2023-01-22 NOTE — Evaluation (Signed)
Physical Therapy Evaluation Patient Details Name: Kristy Sullivan MRN: [id number] DOB: 1945/07/25 Today's Date: 01/22/2023  History of Present Illness  Patient is a 77 y/o female admitted 1118/24 with chest pain, abdominal pain and weakness.  Found to have CT chest positive for aneurysmal disease of thoracic aorta with acute intramural hemorrhage of distal arch/descending aorta with continuation ending prior to origin of renal arteries.  PMH positive for HTN, HLD, A-Fib, CVA (2021 multiple punctate infarcts in R parietal lobe, R MCA distribution), thrombocytosis.  Clinical Impression  Patient presents with decreased mobility due to limited activity tolerance, decreased balance and generalized weakness.  Feel she can benefit from skilled PT in the acute setting to allow d/c home with family support and follow up HHPT.  Patient previously independent and able to work 2 days a week completing many IADL's as well.       If plan is discharge home, recommend the following: A little help with walking and/or transfers;A little help with bathing/dressing/bathroom;Help with stairs or ramp for entrance;Assistance with cooking/housework;Assist for transportation   Can travel by private vehicle        Equipment Recommendations Rolling walker (2 wheels);Other (comment) (shower seat)  Recommendations for Other Services       Functional Status Assessment Patient has had a recent decline in their functional status and demonstrates the ability to make significant improvements in function in a reasonable and predictable amount of time.     Precautions / Restrictions Precautions Precaution Comments: aggressive BP control SBP <120, HR <60.      Mobility  Bed Mobility Overal bed mobility: Needs Assistance Bed Mobility: Supine to Sit, Sit to Supine     Supine to sit: Min assist, HOB elevated Sit to supine: Min assist   General bed mobility comments: assist to lift trunk, to supine assist for  positioning with cues    Transfers Overall transfer level: Needs assistance Equipment used: Rolling walker (2 wheels) Transfers: Sit to/from Stand Sit to Stand: Min assist           General transfer comment: up to stand to RW with A for balance, cues for getting feet on floor first    Ambulation/Gait Ambulation/Gait assistance: Contact guard assist, Min assist Gait Distance (Feet): 40 Feet Assistive device: Rolling walker (2 wheels) Gait Pattern/deviations: Step-to pattern, Wide base of support, Shuffle, Decreased stride length       General Gait Details: increased time, slow pace with very short stride length and wide BOS.  Needs assist to turn walker  Stairs            Wheelchair Mobility     Tilt Bed    Modified Rankin (Stroke Patients Only)       Balance Overall balance assessment: Needs assistance   Sitting balance-Leahy Scale: Good     Standing balance support: Bilateral upper extremity supported Standing balance-Leahy Scale: Poor Standing balance comment: needs assist for balance with RW                             Pertinent Vitals/Pain Pain Assessment Pain Assessment: No/denies pain    Home Living Family/patient expects to be discharged to:: Private residence Living Arrangements: Spouse/significant other Available Help at Discharge: Family;Friend(s) Type of Home: House Home Access: Stairs to enter   [employer] of Steps: 1   Home Layout: One level Home Equipment: Hand held shower head      Prior Function Prior Level of Function :  Independent/Modified Independent               ADLs Comments: 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, driving and completing IADL's at home     Extremity/Trunk Assessment   Upper Extremity Assessment Upper Extremity Assessment: Overall WFL for tasks assessed;Right hand dominant;RUE deficits/detail RUE Deficits / Details: edema throughout, though denies pain and seems  WFL compared to L with strength/ROM    Lower Extremity Assessment Lower Extremity Assessment: Generalized weakness    Cervical / Trunk Assessment Cervical / Trunk Assessment: Normal  Communication   Communication Communication: No apparent difficulties  Cognition Arousal: Alert Behavior During Therapy: WFL for tasks assessed/performed Overall Cognitive Status: Impaired/Different from baseline Area of Impairment: Attention, Following commands                   Current Attention Level: Selective   Following Commands: Follows one step commands consistently, Follows one step commands with increased time                General Comments      Exercises     Assessment/Plan    PT Assessment Patient needs continued PT services  PT Problem List Decreased strength;Decreased cognition;Decreased activity tolerance;Decreased balance;Decreased mobility;Decreased coordination;Impaired sensation       PT Treatment Interventions DME instruction;Gait training;Patient/family education;Functional mobility training;Therapeutic activities;Therapeutic exercise;Balance training    PT Goals (Current goals can be found in the Care Plan section)  Acute Rehab PT Goals Patient Stated Goal: return home, to independent PT Goal Formulation: With patient/family Time For Goal Achievement: 02/05/23 Potential to Achieve Goals: Good    Frequency Min 1X/week     Co-evaluation               AM-PAC PT "6 Clicks" Mobility  Outcome Measure Help needed turning from your back to your side while in a flat bed without using bedrails?: A Little Help needed moving from lying on your back to sitting on the side of a flat bed without using bedrails?: A Little Help needed moving to and from a bed to a chair (including a wheelchair)?: A Little Help needed standing up from a chair using your arms (e.g., wheelchair or bedside chair)?: A Little Help needed to walk in hospital room?: A Lot Help  needed climbing 3-5 steps with a railing? : Total 6 Click Score: 15    End of Session Equipment Utilized During Treatment: Gait belt Activity Tolerance: Patient limited by fatigue Patient left: in bed;with call bell/phone within reach;with family/visitor present   PT Visit Diagnosis: Other abnormalities of gait and mobility (R26.89);Muscle weakness (generalized) (M62.81)    Time: 6283-1517 PT Time Calculation (min) (ACUTE ONLY): 29 min   Charges:   PT Evaluation $PT Eval Moderate Complexity: 1 Mod PT Treatments $Gait Training: 8-22 mins PT General Charges $$ ACUTE PT VISIT: 1 Visit         Sheran Lawless, PT Acute Rehabilitation Services Office:[phone] 01/22/2023   Kristy Sullivan 01/22/2023, 3:51 PM

## 2023-01-22 NOTE — Procedures (Signed)
Thoracentesis  Procedure Note  Kristy Sullivan  [id number]  Jul 12, 1945  Date:01/22/23  Time:5:35 PM   Provider Performing:Brooke Lodema Hong   Procedure: Thoracentesis with imaging guidance (40981)  Indication(s) Pleural Effusion  Consent Risks of the procedure as well as the alternatives and risks of each were explained to the patient and/or caregiver.  Consent for the procedure was obtained and is signed in the bedside chart  Anesthesia Topical only with 1% lidocaine    Time Out Verified patient identification, verified procedure, site/side was marked, verified correct patient position, special equipment/implants available, medications/allergies/relevant history reviewed, required imaging and test results available.   Sterile Technique Maximal sterile technique including full sterile barrier drape, hand hygiene, sterile gown, sterile gloves, mask, hair covering, sterile ultrasound probe cover (if used).  Procedure Description Ultrasound was used to identify appropriate pleural anatomy for placement and overlying skin marked.  Area of drainage cleaned and draped in sterile fashion. Lidocaine was used to anesthetize the skin and subcutaneous tissue.  450 cc's of yellow appearing fluid was drained from the left pleural space. Catheter then removed and bandaid applied to site.   Complications/Tolerance None; patient tolerated the procedure well. Chest X-ray is ordered to confirm no post-procedural complication.   EBL Minimal   Specimen(s) Pleural fluid sent for studies, GS, culture, and flow cytometry     Posey Boyer, MSN, AG-ACNP-BC [facility] 01/22/2023, 5:36 PM  See Amion for pager If no response to pager , please call [phone] until 7pm After 7:00 pm call Elink  [phone]

## 2023-01-23 ENCOUNTER — Inpatient Hospital Stay (HOSPITAL_COMMUNITY): Payer: Medicare PPO

## 2023-01-23 DIAGNOSIS — E440 Moderate protein-calorie malnutrition: Secondary | ICD-10-CM | POA: Insufficient documentation

## 2023-01-23 DIAGNOSIS — I5020 Unspecified systolic (congestive) heart failure: Secondary | ICD-10-CM | POA: Diagnosis not present

## 2023-01-23 DIAGNOSIS — I161 Hypertensive emergency: Secondary | ICD-10-CM | POA: Diagnosis not present

## 2023-01-23 DIAGNOSIS — I71012 Dissection of descending thoracic aorta: Secondary | ICD-10-CM | POA: Diagnosis not present

## 2023-01-23 DIAGNOSIS — Z7189 Other specified counseling: Secondary | ICD-10-CM | POA: Diagnosis not present

## 2023-01-23 DIAGNOSIS — I480 Paroxysmal atrial fibrillation: Secondary | ICD-10-CM | POA: Diagnosis not present

## 2023-01-23 DIAGNOSIS — Z515 Encounter for palliative care: Secondary | ICD-10-CM | POA: Diagnosis not present

## 2023-01-23 LAB — RENAL FUNCTION PANEL
Albumin: 2.9 g/dL — ABNORMAL LOW (ref 3.5–5.0)
Anion gap: 15 (ref 5–15)
BUN: 48 mg/dL — ABNORMAL HIGH (ref 8–23)
CO2: 14 mmol/L — ABNORMAL LOW (ref 22–32)
Calcium: 9.1 mg/dL (ref 8.9–10.3)
Chloride: 105 mmol/L (ref 98–111)
Creatinine, Ser: 2.59 mg/dL — ABNORMAL HIGH (ref 0.44–1.00)
GFR, Estimated: 18 mL/min — ABNORMAL LOW (ref >60)
Glucose, Bld: 120 mg/dL — ABNORMAL HIGH (ref 70–99)
Phosphorus: 4.3 mg/dL (ref 2.5–4.6)
Potassium: 5.1 mmol/L (ref 3.5–5.1)
Sodium: 134 mmol/L — ABNORMAL LOW (ref 135–145)

## 2023-01-23 LAB — MAGNESIUM: Magnesium: 2.5 mg/dL — ABNORMAL HIGH (ref 1.7–2.4)

## 2023-01-23 LAB — HEPATIC FUNCTION PANEL
ALT: 35 U/L (ref 0–44)
ALT: 43 U/L (ref 0–44)
AST: 50 U/L — ABNORMAL HIGH (ref 15–41)
AST: 53 U/L — ABNORMAL HIGH (ref 15–41)
Albumin: 2.8 g/dL — ABNORMAL LOW (ref 3.5–5.0)
Albumin: 2.6 g/dL — ABNORMAL LOW (ref 3.5–5.0)
Alkaline Phosphatase: 120 U/L (ref 38–126)
Alkaline Phosphatase: 128 U/L — ABNORMAL HIGH (ref 38–126)
Bilirubin, Direct: 0.5 mg/dL — ABNORMAL HIGH (ref 0.0–0.2)
Bilirubin, Direct: 0.3 mg/dL — ABNORMAL HIGH (ref 0.0–0.2)
Indirect Bilirubin: 1.1 mg/dL — ABNORMAL HIGH (ref 0.3–0.9)
Indirect Bilirubin: 0.9 mg/dL (ref 0.3–0.9)
Total Bilirubin: 1.6 mg/dL — ABNORMAL HIGH (ref <1.2)
Total Bilirubin: 1.2 mg/dL — ABNORMAL HIGH (ref <1.2)
Total Protein: 6.1 g/dL — ABNORMAL LOW (ref 6.5–8.1)
Total Protein: 5.6 g/dL — ABNORMAL LOW (ref 6.5–8.1)

## 2023-01-23 LAB — CBC
HCT: 36.4 % (ref 36.0–46.0)
Hemoglobin: 11.8 g/dL — ABNORMAL LOW (ref 12.0–15.0)
MCH: 30.4 pg (ref 26.0–34.0)
MCHC: 32.4 g/dL (ref 30.0–36.0)
MCV: 93.8 fL (ref 80.0–100.0)
Platelets: 884 10*3/uL — ABNORMAL HIGH (ref 150–400)
RBC: 3.88 MIL/uL (ref 3.87–5.11)
RDW: 16.1 % — ABNORMAL HIGH (ref 11.5–15.5)
WBC: 31.1 10*3/uL — ABNORMAL HIGH (ref 4.0–10.5)
nRBC: 0.1 % (ref 0.0–0.2)

## 2023-01-23 LAB — PHOSPHORUS: Phosphorus: 3.4 mg/dL (ref 2.5–4.6)

## 2023-01-23 LAB — GLUCOSE, CAPILLARY
Glucose-Capillary: 159 mg/dL — ABNORMAL HIGH (ref 70–99)
Glucose-Capillary: 135 mg/dL — ABNORMAL HIGH (ref 70–99)
Glucose-Capillary: 176 mg/dL — ABNORMAL HIGH (ref 70–99)
Glucose-Capillary: 171 mg/dL — ABNORMAL HIGH (ref 70–99)
Glucose-Capillary: 147 mg/dL — ABNORMAL HIGH (ref 70–99)
Glucose-Capillary: 180 mg/dL — ABNORMAL HIGH (ref 70–99)
Glucose-Capillary: 119 mg/dL — ABNORMAL HIGH (ref 70–99)

## 2023-01-23 LAB — CYTOLOGY - NON PAP

## 2023-01-23 LAB — C-REACTIVE PROTEIN: CRP: 11.7 mg/dL — ABNORMAL HIGH (ref <1.0)

## 2023-01-23 MED ORDER — LACTULOSE 10 GM/15ML PO SOLN
10.0000 g | Freq: Once | ORAL | Status: AC
  Administered 2023-01-23: 10 g via ORAL
  Filled 2023-01-23: qty 15

## 2023-01-23 MED ORDER — SODIUM ZIRCONIUM CYCLOSILICATE 10 G PO PACK
10.0000 g | PACK | Freq: Once | ORAL | Status: AC
  Administered 2023-01-23: 10 g via ORAL
  Filled 2023-01-23: qty 1

## 2023-01-23 MED ORDER — SODIUM BICARBONATE 650 MG PO TABS
1300.0000 mg | ORAL_TABLET | Freq: Two times a day (BID) | ORAL | Status: DC
  Administered 2023-01-23 – 2023-01-27 (×9): 1300 mg via ORAL
  Filled 2023-01-23 (×9): qty 2

## 2023-01-23 MED ORDER — INSULIN ASPART 100 UNIT/ML IJ SOLN
0.0000 [IU] | Freq: Three times a day (TID) | INTRAMUSCULAR | Status: DC
Start: 2023-01-23 — End: 2023-01-27
  Administered 2023-01-23: 1 [IU] via SUBCUTANEOUS
  Administered 2023-01-23 – 2023-01-24 (×2): 2 [IU] via SUBCUTANEOUS
  Administered 2023-01-24: 1 [IU] via SUBCUTANEOUS
  Administered 2023-01-24: 2 [IU] via SUBCUTANEOUS
  Administered 2023-01-25: 1 [IU] via SUBCUTANEOUS
  Administered 2023-01-25: 2 [IU] via SUBCUTANEOUS
  Administered 2023-01-25 – 2023-01-26 (×2): 1 [IU] via SUBCUTANEOUS
  Administered 2023-01-26: 2 [IU] via SUBCUTANEOUS
  Administered 2023-01-26 – 2023-01-27 (×2): 1 [IU] via SUBCUTANEOUS

## 2023-01-23 MED ORDER — BISACODYL 10 MG RE SUPP
10.0000 mg | Freq: Once | RECTAL | Status: AC
  Administered 2023-01-23: 10 mg via RECTAL
  Filled 2023-01-23: qty 1

## 2023-01-23 MED ORDER — THIAMINE MONONITRATE 100 MG PO TABS
100.0000 mg | ORAL_TABLET | Freq: Every day | ORAL | Status: DC
  Administered 2023-01-23 – 2023-01-27 (×5): 100 mg
  Filled 2023-01-23 (×5): qty 1

## 2023-01-23 MED ORDER — VITAL AF 1.2 CAL PO LIQD
1000.0000 mL | ORAL | Status: DC
  Administered 2023-01-23 – 2023-01-25 (×3): 1000 mL
  Filled 2023-01-23 (×7): qty 1000

## 2023-01-23 MED ORDER — TAMSULOSIN HCL 0.4 MG PO CAPS
0.4000 mg | ORAL_CAPSULE | Freq: Every day | ORAL | Status: DC
  Administered 2023-01-23 – 2023-01-27 (×5): 0.4 mg via ORAL
  Filled 2023-01-23 (×5): qty 1

## 2023-01-23 NOTE — TOC Initial Note (Addendum)
Transition of Care [facility]) - Initial/Assessment Note    Patient Details  Name: Kristy Sullivan MRN: [id number] Date of Birth: June 08, 1945  Transition of [facility]) CM/SW Contact:    Elliot Cousin, RN Phone Number: [phone] 01/23/2023, 5:05 PM  Clinical Narrative:    CM spoke to pt and niece at bedside. States she was independent pta. Husband at home to assist with care.  Requesting RW with seat. Will order at dc.  Waiting PT recommendations for possible HH.  Will check benefits for medications.                  Expected Discharge Plan: Home w Home Health Services Barriers to Discharge: Continued Medical Work up   Patient Goals and CMS Choice Patient states their goals for this hospitalization and ongoing recovery are:: wants to recover          Expected Discharge Plan and Services   Discharge Planning Services: CM Consult Post Acute Care Choice: Home Health Living arrangements for the past 2 months: Single Family Home                                      Prior Living Arrangements/Services Living arrangements for the past 2 months: Single Family Home Lives with:: Spouse Patient language and need for interpreter reviewed:: Yes Do you feel safe going back to the place where you live?: Yes      Need for Family Participation in Patient Care: No (Comment) Care giver support system in place?: Yes (comment)   Criminal Activity/Legal Involvement Pertinent to Current Situation/Hospitalization: No - Comment as needed  Activities of Daily Living   ADL Screening (condition at time of admission) Independently performs ADLs?: Yes (appropriate for developmental age) Is the patient deaf or have difficulty hearing?: No Does the patient have difficulty seeing, even when wearing glasses/contacts?: No Does the patient have difficulty concentrating, remembering, or making decisions?: No  Permission Sought/Granted Permission sought to share information with : Case  Manager, PCP, Family Supports Permission granted to share information with : Yes, Verbal Permission Granted  Share Information with NAME: Kalan Rang     Permission granted to share info w Relationship: husband  Permission granted to share info w Contact Information: [phone]  Emotional Assessment Appearance:: Appears stated age Attitude/Demeanor/Rapport: Engaged Affect (typically observed): Accepting Orientation: : Oriented to Self, Oriented to Place, Oriented to  Time, Oriented to Situation   Psych Involvement: No (comment)  Admission diagnosis:  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 [place] [address] Phone: [phone] Fax: [phone]  [id number] - [place], [place] - [address] [place] [address] Phone: [phone] Fax: [phone]     Social Determinants of Health (SDOH) Social History: SDOH Screenings   Food Insecurity: No Food Insecurity (01/19/2023)  Housing: Patient  Declined (01/19/2023)  Transportation Needs: No Transportation Needs (01/19/2023)  Utilities: Not At Risk (01/19/2023)  Tobacco Use: Low Risk  (01/19/2023)   SDOH Interventions:     Readmission Risk Interventions     No data to display

## 2023-01-23 NOTE — Progress Notes (Signed)
Daily Progress Note   Patient Name: Kristy Sullivan       Date: 01/23/2023 DOB: 05/05/1945  Age: 77 y.o. MRN#: [id number] Attending Physician: Steffanie Dunn, DO Primary Care Physician: Patient, No Pcp Per Admit Date: 01/19/2023  Reason for Consultation/Follow-up: Establishing goals of care  Subjective: Feels better than yesterday  Length of Stay: 4  Current Medications: Scheduled Meds:   aspirin  81 mg Oral Daily   atorvastatin  40 mg Oral Daily   carvedilol  6.25 mg Oral BID WC   Chlorhexidine Gluconate Cloth  6 each Topical Daily   feeding supplement  237 mL Oral TID BM   influenza vaccine adjuvanted  0.5 mL Intramuscular Tomorrow-1000   insulin aspart  0-9 Units Subcutaneous TID WC   lactulose  10 g Oral Once   pneumococcal 20-valent conjugate vaccine  0.5 mL Intramuscular Tomorrow-1000   polyethylene glycol  17 g Oral BID   sodium bicarbonate  1,300 mg Oral BID   sodium zirconium cyclosilicate  10 g Oral Once   tamsulosin  0.4 mg Oral Daily   thiamine  100 mg Per Tube Daily    Continuous Infusions:  feeding supplement (VITAL AF 1.2 CAL) 55 mL/hr at 01/23/23 1600    PRN Meds: docusate sodium  Physical Exam          Vital Signs: BP 107/61 (BP Location: Right Arm)   Pulse 81   Temp 98 F (36.7 C) (Oral)   Resp 17   Ht 5\' 3"  (1.6 m)   Wt 63.6 kg   LMP  (LMP Unknown)   SpO2 98%   BMI 24.84 kg/m  SpO2: SpO2: 98 % O2 Device: O2 Device: Nasal Cannula O2 Flow Rate: O2 Flow Rate (L/min): 3 L/min  Intake/output summary:  Intake/Output Summary (Last 24 hours) at 01/23/2023 1832 Last data filed at 01/23/2023 1600 Gross per 24 hour  Intake 1020.71 ml  Output 525 ml  Net 495.71 ml   LBM: Last BM Date : 01/18/23 Baseline Weight: Weight: 68.7 kg Most recent weight: Weight:  63.6 kg       Palliative Assessment/Data:      Patient Active Problem List   Diagnosis Date Noted   Malnutrition of moderate degree 01/23/2023   Pleural effusion 01/22/2023   Hypertensive emergency 01/22/2023   HFrEF (heart failure with reduced ejection fraction) (HCC) 01/22/2023   PAF (paroxysmal atrial fibrillation) (HCC) 01/22/2023   Descending thoracic aortic dissection (HCC) 01/19/2023   Essential thrombocythemia (HCC) 09/30/2019   Elevated troponin 09/30/2019   Stroke (HCC) 09/30/2019   HTN (hypertension) 09/30/2019   Atrial fibrillation (HCC)     Palliative Care Assessment & Plan   HPI: 77 y.o. female  with past medical history of CVA 2021, HTN, HLD, a fib, and thrombocytosis admitted on 01/19/2023 with chest pain, abdominal pain, and weakness. Diagnosed with descending aortic intramural hematoma, likely d/t uncontrolled htn. Also with leukocytosis, thrombocytosis, anemia, splenomegaly, JAK-2 positive, anasarca w/ascites, and AKI. Found to have EF down to 45% from 55%. Also found with moderate left pleural effusion. Patient with poor intake. Of note, patient lost to follow up about 2 years ago in favor of holistic approach.  PMT consulted as patient  is a high risk for significant complications.   Assessment: Follow up today with patient. Her nephew is at bedside. She reports feeling much better today than yesterday. She tells me she has a renewed will to "fight" and "not give up" following conversations with her family.  We review our conversation from yesterday and current situation.  We discuss need for improved nutrition as cortrak is temporary. She understands.   Recommendations/Plan: Goals unchanged - full code/full scope but would not want long term intubation Current situation shocking to family, patient was very functional prior to admission Will request PMT provider follow up early next week - please call if needs arise prior  Code Status: Full code  Care  plan was discussed with patient and nephew  Thank you for allowing the Palliative Medicine Team to assist in the care of this patient.   Total Time 25 minutes Prolonged Time Billed  no   Time spent includes: Detailed review of medical records (labs, imaging, vital signs), medically appropriate exam, discussion with treatment team, counseling and educating patient, family and/or staff, documenting clinical information, medication management and coordination of care.     *Please note that this is a verbal dictation therefore any spelling or grammatical errors are due to the "Dragon Medical One" system interpretation.  Gerlean Ren, DNP, [facility] Palliative Medicine Team Team Phone # [phone]  Pager [phone]

## 2023-01-23 NOTE — Plan of Care (Signed)
  Problem: Clinical Measurements: Goal: Ability to maintain clinical measurements within normal limits will improve Outcome: Progressing Goal: Will remain free from infection Outcome: Progressing Goal: Diagnostic test results will improve Outcome: Progressing Goal: Respiratory complications will improve Outcome: Progressing Goal: Cardiovascular complication will be avoided Outcome: Progressing   Problem: Education: Goal: Knowledge of General Education information will improve Description: Including pain rating scale, medication(s)/side effects and non-pharmacologic comfort measures Outcome: Progressing   Problem: Activity: Goal: Risk for activity intolerance will decrease Outcome: Progressing   Problem: Nutrition: Goal: Adequate nutrition will be maintained Outcome: Progressing

## 2023-01-23 NOTE — Progress Notes (Signed)
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  11/21 pleural effusion s/p thoracentesis. PMHx: HTN, HLD, A-Fib, CVA, thrombocytosis.    PT Comments  Pt ambulated 49ft x2, reported fatigue and HR shortly raised to 89, quickly returns to below 80. Throughout session HR 69-81, brief stents HR would jump greater than 80 max 97, would fluctuate between various rates, mostly 70-84. Pt son reported currently remodeling pts home to improve function post discharge.  BP: 93/70 supine, 104/70 EOB, 97/59 in chair post session.   If plan is discharge home, recommend the following: A little help with walking and/or transfers;A little help with bathing/dressing/bathroom;Help with stairs or ramp for entrance;Assistance with cooking/housework;Assist for transportation   Can travel by private Psychologist, clinical (4 wheels);BSC/3in1    Recommendations for Other Services       Precautions / Restrictions Precautions Precautions: Fall Precaution Comments: aggressive BP control SBP <120, HR <80. Restrictions Weight Bearing Restrictions: No     Mobility  Bed Mobility Overal bed mobility: Needs Assistance Bed Mobility: Supine to Sit     Supine to sit: Min assist, HOB elevated     General bed mobility comments: minA for lifting trunk    Transfers Overall transfer level: Needs assistance Equipment used: Rolling walker (2 wheels) Transfers: Sit to/from Stand Sit to Stand: Min assist           General transfer comment: Pt pulls from RW to stnd, cueing to use bed and armrest to help pushup, assist to hold  RW to avoid tipping, completed 4x throughout, EOB x1, recliner x2, BSCx1.    Ambulation/Gait Ambulation/Gait assistance: Contact guard assist Gait Distance (Feet): 30 Feet Assistive device: Rolling walker (2 wheels) Gait Pattern/deviations: Step-to pattern, Wide base of support, Shuffle, Decreased stride length   Gait velocity interpretation: <1.31 ft/sec, indicative of household ambulator   General Gait Details: increased time, slow pace with very short stride length and wide BOS. Wide slow turning, seated rest break due to HR intermittently >80, ambulated 110ftx2   Stairs             Wheelchair Mobility     Tilt Bed    Modified Rankin (Stroke Patients Only)       Balance Overall balance assessment: Needs assistance Sitting-balance support: Feet supported, Feet unsupported, No upper extremity supported, Bilateral upper extremity supported Sitting balance-Leahy Scale: Good     Standing balance support: Bilateral upper extremity supported, Reliant on assistive device for balance, During functional activity Standing balance-Leahy Scale: Poor Standing balance comment: reliant on R Wfor standing                            Cognition Arousal: Alert Behavior During Therapy: WFL for tasks assessed/performed Overall Cognitive Status: Impaired/Different from baseline Area of Impairment: Attention, Following commands                   Current Attention Level: Selective   Following Commands: Follows one step commands consistently, Follows one step commands with increased time                Exercises  General Comments        Pertinent Vitals/Pain Pain Assessment Pain Assessment: No/denies pain    Home Living                          Prior Function            PT Goals (current goals can now be found in the care plan section)      Frequency    Min 1X/week      PT Plan      Co-evaluation               AM-PAC PT "6 Clicks" Mobility   Outcome Measure  Help needed turning from your back to your side while in a flat bed without using bedrails?: A Little Help needed moving from lying on your back to sitting on the side of a flat bed without using bedrails?: A Little Help needed moving to and from a bed to a chair (including a wheelchair)?: A Little Help needed standing up from a chair using your arms (e.g., wheelchair or bedside chair)?: A Little Help needed to walk in hospital room?: A Little Help needed climbing 3-5 steps with a railing? : Total 6 Click Score: 16    End of Session   Activity Tolerance: Patient tolerated treatment well Patient left: with call bell/phone within reach;with family/visitor present;in chair Nurse Communication: Mobility status PT Visit Diagnosis: Other abnormalities of gait and mobility (R26.89);Muscle weakness (generalized) (M62.81)     Time: 4540-9811 PT Time Calculation (min) (ACUTE ONLY): 29 min  Charges:    $Gait Training: 8-22 mins $Therapeutic Activity: 8-22 mins PT General Charges $$ ACUTE PT VISIT: 1 Visit                     Andrey Farmer SPT Secure chat preferred    Darlin Drop 01/23/2023, 10:11 AM

## 2023-01-23 NOTE — Progress Notes (Signed)
Initial Nutrition Assessment  DOCUMENTATION CODES:   Non-severe (moderate) malnutrition in context of chronic illness  INTERVENTION:   Continue Regular diet as tolerated  Continue Ensure Enlive po BID, each supplement provides 350 kcal and 20 grams of protein.  Tube Feedings via Cortrak:  Vital 1.2 AF at 55 ml/hr Begin TF at rate of 20 ml/hr, titrate by 10 mL q 8 hours until goal rate of 55 ml/hr TF at goal provides 1584 kcals, 99 g of protein and 1003 mL of free water  Monitor magnesium, potassium, and phosphorus BID for at least 3 days, MD to replete as needed, as pt is at risk for refeeding syndrome   Thiamine 100 mg daily per tube  Recommend checking copper/ceruloplasmin and zinc with CRP  +constipation; recommend continued bowel regimen with escalation as needed  until pt with BM  NUTRITION DIAGNOSIS:   Moderate Malnutrition related to chronic illness as evidenced by moderate fat depletion, severe muscle depletion, moderate muscle depletion, mild fat depletion.  GOAL:   Patient will meet greater than or equal to 90% of their needs  MONITOR:   PO intake, TF tolerance, Labs, Weight trends  REASON FOR ASSESSMENT:   Consult Assessment of nutrition requirement/status  ASSESSMENT:   77 yo female admitted with chest pain, intermittent abdominal pain and generalized weakness with HTN emergency, descending aortic intramural hematoma, AKI, leukocytosis and thrmbocytosis with mild anemia, splenomegaly PMH includes HTN, HLD, CVA, systolic HF, afib  11/21 Thoracentesis: 450 mL yellow fluid  Pathology from thoracentesis pending.   Pt alert on visit today, eating some broth from chicken noodle soup. Appears profoundly weak, decompensated. Family at bedside  Family reports at baseline, pt enjoys eating a restaurants, enjoys food and enjoys cooking. Per report, pt was walking and performing ADLs PTA as well.   Recorded po intake 5% on Regular diet. No appetite.   Cortrak  placed today, tip gastric, plan to initiate TF  Not BM since admission; noted order for lactulose x 1. Miralax BID  Current wt 63.6 kg; admit wt 68-69 kg. Noted wt loss per wt encounters.  Currently oliguric; net +since admission   Labs:potassium 5.1 (wdl), sodium 134 (L), BUN 48, Creatinine 2.59, CBGs 127-176 Meds: sodium bicarbonate   NUTRITION - FOCUSED PHYSICAL EXAM:  Flowsheet Row Most Recent Value  Orbital Region Moderate depletion  Upper Arm Region Mild depletion  Thoracic and Lumbar Region Mild depletion  Buccal Region Moderate depletion  Temple Region Severe depletion  Clavicle Bone Region Moderate depletion  Clavicle and Acromion Bone Region Moderate depletion  Scapular Bone Region Moderate depletion  Dorsal Hand Moderate depletion  Patellar Region Moderate depletion  Anterior Thigh Region Moderate depletion  Posterior Calf Region Mild depletion  Edema (RD Assessment) Mild       Diet Order:   Diet Order             Diet regular Room service appropriate? Yes; Fluid consistency: Thin  Diet effective now                   EDUCATION NEEDS:   Education needs have been addressed  Skin:  Skin Assessment: Reviewed RN Assessment  Last BM:  11/17  Height:   Ht Readings from Last 1 Encounters:  01/19/23 5\' 3"  (1.6 m)    Weight:   Wt Readings from Last 1 Encounters:  01/22/23 63.6 kg    BMI:  Body mass index is 24.84 kg/m.  Estimated Nutritional Needs:   Kcal:  1550-1750 kcals  Protein:  75-85 g  Fluid:  >/= 1.5 L  Romelle Starcher MS, RDN, LDN, CNSC Registered Dietitian 3 Clinical Nutrition RD Pager and On-Call Pager Number Located in [place]

## 2023-01-23 NOTE — Progress Notes (Signed)
Please see consult note for details. Reviewed with PCCM. Limited options for treating her mildly reduced EF in setting of hypotension with BB and rising Cr with reduced UOP.  If renal function recovers, can consider GDMT. BB may be indicated for BP and HR management in setting of vascular disease, if tolerated.   Cardiology will sign off but please re-engage if we can be of assistance.  Parke Poisson, MD

## 2023-01-23 NOTE — Procedures (Signed)
Cortrak  Person Inserting Tube:  Maysen Sudol T, RD Tube Type:  Cortrak - 43 inches Tube Size:  10 Tube Location:  Left nare Secured by: Bridle Technique Used to Measure Tube Placement:  Marking at nare/corner of mouth Cortrak Secured At:  65 cm   Cortrak Tube Team Note:  Consult received to place a Cortrak feeding tube.   X-ray is required, abdominal x-ray has been ordered by the Cortrak team. Please confirm tube placement before using the Cortrak tube.   If the tube becomes dislodged please keep the tube and contact the Cortrak team at [email or web address] for replacement.  If after hours and replacement cannot be delayed, place a NG tube and confirm placement with an abdominal x-ray.    Shelle Iron RD, LDN For contact information, refer to [facility].

## 2023-01-23 NOTE — Progress Notes (Signed)
eLink Physician-Brief Progress Note Patient Name: Kristy Sullivan DOB: 1946/01/22 MRN: [id number]   Date of Service  01/23/2023  HPI/Events of Note  Notified by nurse of small amount of dark stool that was blood tinged.  Hemodynamically stable.  eICU Interventions  Ok to continue with transfer Am cbc is already ordered     Intervention Category Intermediate Interventions: Bleeding - evaluation and treatment with blood products  Henry Russel, P 01/23/2023, 9:08 PM

## 2023-01-23 NOTE — Progress Notes (Signed)
  Progress Note    01/23/2023 8:48 AM * No surgery found *  Subjective:  feels ok this morning. Eating breakfast    Vitals:   01/23/23 0500 01/23/23 0600  BP:  (!) 90/54  Pulse: 81 81  Resp: 18 (!) 22  Temp:    SpO2: 94% 96%    Physical Exam: General:  sitting up in bed, alert and oriented Cardiac:  regular Lungs:  nonlabored Extremities:  palpable DP pulses, L>R  CBC    Component Value Date/Time   WBC 31.1 (H) 01/23/2023 0223   RBC 3.88 01/23/2023 0223   HGB 11.8 (L) 01/23/2023 0223   HCT 36.4 01/23/2023 0223   PLT 884 (H) 01/23/2023 0223   MCV 93.8 01/23/2023 0223   MCH 30.4 01/23/2023 0223   MCHC 32.4 01/23/2023 0223   RDW 16.1 (H) 01/23/2023 0223   LYMPHSABS 0.4 (L) 01/19/2023 2229   MONOABS 1.3 (H) 01/19/2023 2229   EOSABS 0.1 01/19/2023 1125   BASOSABS 0.2 (H) 01/19/2023 1125    BMET    Component Value Date/Time   NA 134 (L) 01/23/2023 0223   K 5.1 01/23/2023 0223   CL 105 01/23/2023 0223   CO2 14 (L) 01/23/2023 0223   GLUCOSE 120 (H) 01/23/2023 0223   BUN 48 (H) 01/23/2023 0223   CREATININE 2.59 (H) 01/23/2023 0223   CALCIUM 9.1 01/23/2023 0223   GFRNONAA 18 (L) 01/23/2023 0223   GFRAA >60 10/31/2019 0920    INR    Component Value Date/Time   INR 1.6 (H) 01/19/2023 2229     Intake/Output Summary (Last 24 hours) at 01/23/2023 0848 Last data filed at 01/23/2023 0600 Gross per 24 hour  Intake 1541.07 ml  Output 395 ml  Net 1146.07 ml      Assessment/Plan:  77 y.o. female with aortic intramural hematoma   -Patient is doing okay this morning. Denies any abdominal, back, or chest pain. She is trying to eat a little bit of breakfast -BLE well perfused with palpable DP pulses, L>R -Persistent leukocytosis however her WBC has dropped to 31k this morning. She had a diagnostic thoracentesis yesterday -Scr continues to rise, at 2.59 today. Likely due to kidney hypoperfusion in the setting of normotension -No vascular intervention  warranted at this time. Continue HR control <80 and SBP <120   Loel Dubonnet, PA-C [facility] [phone] 01/23/2023 8:48 AM

## 2023-01-23 NOTE — Progress Notes (Signed)
NAME:  Kristy Sullivan, MRN:  [id number], DOB:  1945/06/24, LOS: 4 ADMISSION DATE:  01/19/2023 CONSULTATION DATE:  01/19/2023 REFERRING MD:  Fuller Plan [facility] EDP CHIEF COMPLAINT:  Chest pain  History of Present Illness:  77 year old woman who presented to [facility] ED 11/18 for chest pain, intermittent abdominal pain and generalized weakness (primarily BLE). PMHx significant for HTN, HLD, AFib (on Eliquis, reportedly not taking), CVA (embolic, MRI Brain 09/2019 with multiple punctate acute infarcts R parietal lobe, c/f R MCA distribution), thrombocytosis.  History is primarily obtained from chart review; it seems patient may be a poor historian as she denies history of AFib but this is well-documented in her chart. Additionally, per last Cardiology/Heme/Onc notes, patient was supposed to be on ASA, Eliquis, statin, hydroxyurea.  On ED arrival, patient was hypertensive to 140s and in Afib with rates 90s-130s. Labs were notable for WBC 50, Hgb 13.1, Plt 1.3 million. INR 1.4, APTT 34. Na 138, K 3.3, CO2 24, Cr 1.04. Trop 81 > 63. BNP 597.5. LA 1.6. UA unremarkable. CXR with findings c/f CHF/pulmonary edema; CTA Chest/A/P with aneurysmal disease of the thoracic aorta at the level of the distal arch/descending aorta with component of acute intramural hemorrhage (arch max caliber 3.9cm, descending aorta max caliber 3.7cm) with continuation into descending aorta likely ending before the origin of the renal arteries. LE Dopplers negative for DVT. TCTS consulted with recommendation for VVS consult, as condition likely nonoperative.  PCCM consulted for ICU admission/transfer to [facility] for further care.  Pertinent Medical History:   Past Medical History:  Diagnosis Date   Embolic stroke (HCC)    a. 09/2019 MRI: several punctate acute infarcts in R parietal lobe affecting the cortical and subcortical brain consistent w/ micro embolic infarcts in R MCA branch vessel distribution.   Hyperlipidemia    Hypertension     Persistent atrial fibrillation (HCC)    a. 09/2019 Dx in setting of ED visit for weakness-->embolic stroke; b. CHA2DS2VASc = 6-->eliquis; c. 09/2019 Echo: EF 50-55%, no rwma, mild LVH. Nl RV size/fxn. Mild LAE, mod RAE. Mild to mod MR.   Thrombocythemia, essential (HCC)    JAK2 positive    Meds 07/2020: Voltaren gel - R shoulder Eliquis 5mg  BID ASA 81mg  daily Lipitor 40mg  daily Cod liver il Lopressor 25mg  BID MV Hydroxyurea 1000mg  BID?  Significant Hospital Events: Including procedures, antibiotic start and stop dates in addition to other pertinent events   11/18 - Presented to [facility] ED for CP/abdominal. Found to have descending aortic intramural hematoma. PCCM consulted for transfer to [facility] for higher level of care. VVS aware. 11/19 esmolol 275 and intermittent cleviprex low dose 11/20 repeat CTA - stable intramural hematoma, urinary retention, sCr up 11/21 lopressor changed to coreg, off esmolol, left diagnostic thoracentesis  Interim History / Subjective:  Ambulated in hall this am Denies current complaints- reported had some abd pain earlier and sometimes when lying flat- points to epigastric area Still poor PO intake, no appetite  Objective:  Blood pressure 106/67, pulse 89, temperature 98 F (36.7 C), temperature source Oral, resp. rate (!) 23, height 5\' 3"  (1.6 m), weight 63.6 kg, SpO2 98%.        Intake/Output Summary (Last 24 hours) at 01/23/2023 1214 Last data filed at 01/23/2023 1000 Gross per 24 hour  Intake 1024.8 ml  Output 580 ml  Net 444.8 ml   Filed Weights   01/20/23 0210 01/21/23 0425 01/22/23 0700  Weight: 68.7 kg 70 kg 63.6 kg    Physical  Examination: General:  Frail appearing older female sitting upright in bed bedside recliner HEENT: MM pink/minimally moist, normal JVP, pupils 3/r Neuro: oriented x3, MAE w/generalized weakness, no focal deficits CV: irir, afib, no murmur PULM:  non labored, clear anteriorly, diminished in left base, few rales in  right base GI: distended but denies tenderness, +bs, foley- cyu Extremities: warm/dry, no pitting tibal edema  Skin: no rashes   UOP 425 ml/ 24hrs  Overall I/O's inaccurate due to unmeasured urinary occurrences Labs> Na 134, bicarb 15, BUN/ sCR 24/ 1.96> 34/ 2.29, WBC 38.5> 41.6, H/H stable, pts stable 1046     Assessment & Plan:   Descending aortic intramural hematoma, likely in the setting of uncontrolled HTN - reportedly off all meds for several years due to ?financial concerns and holistic approach P:  - VVS following.  Repeat CTA 11/21 stable.  Continue aggressive medical management,  SBP < 120, HR < 80 - hold norvasc 10mg  today given lower BP's after changing from metoprolol 100mg  BID to coreg 25mg  (received one dose and off esmolol) yest, requiring albumin x 2 11/21 for lower BP's and low UOP w/foley.  - cont coreg at 6.125mg  BID - if BP stable> will transfer to PCU later today and to [facility] 11/23   Afib, chronic Systolic HF/ cardiomyopathy  - CHA?DS?-VASc score 6. Off OAC since ?2021-2022 - LE Dopplers fortunately negative for DVT - Echo 01/21/23 TTE> variability with Afib, EF 45%, mildly reduced RV, LA mod dilated, RA severely dilated, small pericardial effusion, mod left pleural effusion, moderate TV regurgitation, suggested RA pressure 15 with hepatic reflux. Severe concentric LVH P:  - appreciate cardiology input.  Concentric LVH likely in the setting of uncontrolled HTN, less likely infiltrative process   - SBP goal < 120, HR < 80, BP management as above - remains rate controlled - optimize electrolytes for K > 4, Mg > 2 - cont holding AC for now.  Will need to restart when cleared by VVS, likely off for 1 month - GDMT as able> limited with renal function   Leukocytosis and thrombocytosis with mild anemia, splenomegaly JAK-2 positive Anasarca with ascites - Seen by Heme/Onc 10/2019, placed on hydroxyurea, unclear when she stopped taking, lost in f/u x 2 yrs - Denies  any infectious hx/symptoms  - PCT 0.22 11/19 - s/p oncology consult 11/20> no inpt plans, hold restarting hydroxyurea, f/u outpt P:  - trend CBC, WBC and plts better today - follow [facility] 11/18 > ngtd)/ fever curve - limited RUQ liver US with doppler > normal flow in hepatic vasculature w/ hepatosplenomegaly.  Chronically elevated t. Bili improved on LFTs, AST 50 - maximize nutrition; malnutrition certainly contributing w/ascites - f/u with oncology outpt   Hypoxic respiratory failure Moderate left pleural effusion possibly loculated, small right Bibasilar atelectasis  - cont to wean supplemental O2> down to 1L [place] today - s/p diagnostic left thoracentesis 11/21 with removed.  Residual L effusion on CXR. Pleural studies thus far, exudate by LDH but transudate by protein, neutrophil predominate> will discuss with attending.  GS, cytology and flow cytometry pending - monitoring off abx for now - prn CXR - encourage pulmonary hygiene- IS (doing better volumes today), mobilizing to chair - PT  - could consider albumin/ lasix but BP would not support this at this time and cautiously monitoring AKI  AKI, unclear if baseline CKD Urinary retention NAGMA, mild - likely multifactorial> suspect uncontrolled HTN now controlled, poor PO intake, IV contrast x2 P:  -  sCr continues to trend up, electrolytes ok> watch k - trial of foley out, bladder scan q4hrs - change to flomax  - increase oral bicarb -  trend renal indices  - strict I/Os, daily wts - avoid nephrotoxins, renal dose meds, hemodynamic support as above   HTN HLD - BP management as above - cont ASA/ statin   Prior CVA, embolic CTA Head/Neck 09/2019 without LVO, +small infarctions of R parietal region c/w microembolic infarctions. MRI Brain 09/2019 with several punctate acute infarctions R parietal lobe (cortical/subcortical) c/w microembolic infarctions in R MCA branch vessel distribution. Lipid panel WN (total 154, LDL  89, HDL 53, Trigs 60). - monitor neuro, remains non focal - ongoing aggressive secondary stroke prevention as indicated - ASA/ statin - echo as above   Pre-diabetes - A1c 6.1 - prn SSI    Protein calorie malnutrition - intake remains poor.  No appetite but this seems to be chronic issue after discussions with family. RD consulted.  Pt ok with cortrak for now to help maximize nutrition - no BM since admit.  Bowel regimen in place> continue, add dulcolax   Overall - PMT consulted to help determine her overall GOCs given pt is high risk for significant complications with underlying medical problems with now new issues 2/2 to stopping her meds ~30yrs ago and lost to medical f/u and took more holistic approach.  Family report also financial burden with meds and was intolerant of side effects with some of her meds.  Remains full/ aggressive care   Best Practice: (right click and "Reselect all SmartList Selections" daily)   Diet/type: Regular consistency (see orders) DVT prophylaxis: SCDs GI prophylaxis: N/A Lines: N/A Foley:  Yes, and it is no longer needed and removal ordered  Code Status:  full code Last date of multidisciplinary goals of care discussion [11/21]        Posey Boyer, MSN, AG-ACNP-BC [facility] 01/23/2023, 12:14 PM  See Amion for pager If no response to pager , please call [phone] until 7pm After 7:00 pm call Elink  [phone]

## 2023-01-24 ENCOUNTER — Inpatient Hospital Stay (HOSPITAL_COMMUNITY): Payer: Medicare PPO

## 2023-01-24 DIAGNOSIS — I71012 Dissection of descending thoracic aorta: Secondary | ICD-10-CM | POA: Diagnosis not present

## 2023-01-24 DIAGNOSIS — M7989 Other specified soft tissue disorders: Secondary | ICD-10-CM | POA: Diagnosis not present

## 2023-01-24 LAB — RENAL FUNCTION PANEL
Albumin: 2.6 g/dL — ABNORMAL LOW (ref 3.5–5.0)
Anion gap: 11 (ref 5–15)
BUN: 51 mg/dL — ABNORMAL HIGH (ref 8–23)
CO2: 19 mmol/L — ABNORMAL LOW (ref 22–32)
Calcium: 9.7 mg/dL (ref 8.9–10.3)
Chloride: 108 mmol/L (ref 98–111)
Creatinine, Ser: 2.35 mg/dL — ABNORMAL HIGH (ref 0.44–1.00)
GFR, Estimated: 21 mL/min — ABNORMAL LOW (ref >60)
Glucose, Bld: 154 mg/dL — ABNORMAL HIGH (ref 70–99)
Phosphorus: 2.8 mg/dL (ref 2.5–4.6)
Potassium: 4.9 mmol/L (ref 3.5–5.1)
Sodium: 138 mmol/L (ref 135–145)

## 2023-01-24 LAB — GLUCOSE, CAPILLARY
Glucose-Capillary: 162 mg/dL — ABNORMAL HIGH (ref 70–99)
Glucose-Capillary: 124 mg/dL — ABNORMAL HIGH (ref 70–99)
Glucose-Capillary: 159 mg/dL — ABNORMAL HIGH (ref 70–99)
Glucose-Capillary: 161 mg/dL — ABNORMAL HIGH (ref 70–99)
Glucose-Capillary: 151 mg/dL — ABNORMAL HIGH (ref 70–99)

## 2023-01-24 LAB — CULTURE, BLOOD (ROUTINE X 2)
Culture: NO GROWTH
Special Requests: ADEQUATE

## 2023-01-24 LAB — MAGNESIUM: Magnesium: 2.5 mg/dL — ABNORMAL HIGH (ref 1.7–2.4)

## 2023-01-24 LAB — PHOSPHORUS: Phosphorus: 2.3 mg/dL — ABNORMAL LOW (ref 2.5–4.6)

## 2023-01-24 MED ORDER — PHENOL 1.4 % MT LIQD
1.0000 | OROMUCOSAL | Status: DC | PRN
  Administered 2023-01-24: 1 via OROMUCOSAL
  Filled 2023-01-24: qty 177

## 2023-01-24 NOTE — Progress Notes (Signed)
  Progress Note    01/24/2023 9:39 AM * No surgery found *  Subjective:  Feeling better today.  Has an appetite.  Bowel movement yesterday   Vitals:   01/24/23 0750 01/24/23 0833  BP: 110/78 107/73  Pulse: 77 82  Resp: (!) 21 (!) 21  Temp: 98.7 F (37.1 C)   SpO2: 94% 97%   Physical Exam: Lungs:  non labored Extremities:  palpable L DP; R foot warm and well perfused with motor and sensation intact Abdomen:  soft, NT, ND Neurologic: A&O  CBC    Component Value Date/Time   WBC 31.1 (H) 01/23/2023 0223   RBC 3.88 01/23/2023 0223   HGB 11.8 (L) 01/23/2023 0223   HCT 36.4 01/23/2023 0223   PLT 884 (H) 01/23/2023 0223   MCV 93.8 01/23/2023 0223   MCH 30.4 01/23/2023 0223   MCHC 32.4 01/23/2023 0223   RDW 16.1 (H) 01/23/2023 0223   LYMPHSABS 0.4 (L) 01/19/2023 2229   MONOABS 1.3 (H) 01/19/2023 2229   EOSABS 0.1 01/19/2023 1125   BASOSABS 0.2 (H) 01/19/2023 1125    BMET    Component Value Date/Time   NA 138 01/24/2023 0705   K 4.9 01/24/2023 0705   CL 108 01/24/2023 0705   CO2 19 (L) 01/24/2023 0705   GLUCOSE 154 (H) 01/24/2023 0705   BUN 51 (H) 01/24/2023 0705   CREATININE 2.35 (H) 01/24/2023 0705   CALCIUM 9.7 01/24/2023 0705   GFRNONAA 21 (L) 01/24/2023 0705   GFRAA >60 10/31/2019 0920    INR    Component Value Date/Time   INR 1.6 (H) 01/19/2023 2229     Intake/Output Summary (Last 24 hours) at 01/24/2023 1610 Last data filed at 01/24/2023 0600 Gross per 24 hour  Intake 879.75 ml  Output 262 ml  Net 617.75 ml     Assessment/Plan:  77 y.o. female with aortic intramural hematoma  BLE well perfused on physical exam Patient subjectively feeling much better today No change in size of intramural hematoma earlier in the week and no new symptoms Continue strict BP and HR control Nothing further to add from vascular standpoint    Emilie Rutter, PA-C [facility] [phone] 01/24/2023 9:39 AM

## 2023-01-24 NOTE — Plan of Care (Signed)
Problem: Education: Goal: Knowledge of General Education information will improve Description: Including pain rating scale, medication(s)/side effects and non-pharmacologic comfort measures Outcome: Progressing   Problem: Health Behavior/Discharge Planning: Goal: Ability to manage health-related needs will improve Outcome: Progressing   Problem: Clinical Measurements: Goal: Ability to maintain clinical measurements within normal limits will improve Outcome: Progressing Goal: Will remain free from infection Outcome: Progressing Goal: Diagnostic test results will improve Outcome: Progressing Goal: Respiratory complications will improve Outcome: Progressing Goal: Cardiovascular complication will be avoided Outcome: Progressing   Problem: Activity: Goal: Risk for activity intolerance will decrease Outcome: Progressing   Problem: Nutrition: Goal: Adequate nutrition will be maintained Outcome: Progressing   Problem: Coping: Goal: Level of anxiety will decrease Outcome: Progressing   Problem: Elimination: Goal: Will not experience complications related to bowel motility Outcome: Progressing Goal: Will not experience complications related to urinary retention Outcome: Progressing   Problem: Pain Management: Goal: General experience of comfort will improve Outcome: Progressing   Problem: Safety: Goal: Ability to remain free from injury will improve Outcome: Progressing   Problem: Skin Integrity: Goal: Risk for impaired skin integrity will decrease Outcome: Progressing   Problem: Education: Goal: Ability to describe self-care measures that may prevent or decrease complications (Diabetes Survival Skills Education) will improve Outcome: Progressing Goal: Individualized Educational Video(s) Outcome: Progressing   Problem: Coping: Goal: Ability to adjust to condition or change in health will improve Outcome: Progressing   Problem: Fluid Volume: Goal: Ability to  maintain a balanced intake and output will improve Outcome: Progressing   Problem: Health Behavior/Discharge Planning: Goal: Ability to identify and utilize available resources and services will improve Outcome: Progressing Goal: Ability to manage health-related needs will improve Outcome: Progressing   Problem: Metabolic: Goal: Ability to maintain appropriate glucose levels will improve Outcome: Progressing

## 2023-01-24 NOTE — Progress Notes (Signed)
PROGRESS NOTE    Kristy Sullivan  RUE:[id number] DOB: [birth year] DOA: 01/19/2023 PCP: Patient, No Pcp Per   Brief Narrative:  This 77 years old female with PMH significant for hypertension, hyperlipidemia, A-fib on Eliquis, CVA, thrombocytosis presented to the [facility] ED with complaints of chest pain, intermittent abdominal pain and generalized weakness.  Patient was hypertensive and in A-fib with RVR on arrival in the ED.  WBC 50K, BNP 597, UA unremarkable, chest x-ray finding consistent with CHF, pulmonary edema.  CTA chest showing aneurysmal disease of thoracic aorta with acute intramural hemorrhage with continuation into descending aorta likely and before the origin of renal arteries.  Lower extremity duplex negative for DVT.  Cardiothoracic surgery was consulted, vascular surgery was consulted recommended condition is nonoperative.  Patient remained admitted in ICU. TRH pickup 11/23.  Assessment & Plan:   Principal Problem:   Descending thoracic aortic dissection (HCC) Active Problems:   Pleural effusion   Hypertensive emergency   HFrEF (heart failure with reduced ejection fraction) (HCC)   PAF (paroxysmal atrial fibrillation) (HCC)   Malnutrition of moderate degree  Hypertensive emergency with acute aortic thrombus: Monitor blood pressure and heart rate control. Continue Coreg for blood pressure and rhythm control. Goal SBP <120, HR  <60.  Amlodipine is on hold. Appreciate vascular surgery consultation. Continue Lipitor 40 mg daily. BP improved.   Newly diagnosed HFrEF: Patient has significant leg edema. Hold diuretics with renal failure & poor PO intake. Unable to tolerate ARB and spironolactone due to AKI   AKI with metabolic acidosis: Most likely due to sudden drop in BP and inability to autoregulate. Lokelma given,  potassium improved. Continue bicarb supplementation Monitor renal functions, slightly improving Cautious with diuresis to hopefully ensure adequate renal  preload with her poor PO intake   Paroxysmal Afib Holding AC for about a month per VS Continue tele monitoring monitor electrolytes   Thrombocytosis, neutrophilic leukocytosis: Concerning for hematologic malignancy. JAK2+ Monitor CBC Continue aspirin daily.   Hyperbilirubinemia: Elevated transaminase-  Likely congestive heart failure Follow up RUQ  Moderate protein energy malnutrition: Continue cortrak feeds , start TF due to lack of adequate PO intake -ad lib PO intake   Hyperglycemia Continue SSI PRN goal BG 140-180   Prediabetes, A1c 6.1 OP follow up recommended SSI PRN   Partially loculated pleural effusion- exudative (high LDH, neutrophilic-- similar to peripheral blood, but overall low cell count is more concerning for malignancy than acute infectious etiology, although lymphocytic effusions are still the more common presentation of MPE) Follow-up flow cytometry. Follow pleural fluid culture Monitor for recurrence of effusion.     DVT prophylaxis:  SCDs Code Status: Full code Family Communication: (Son at bed side. Disposition Plan:    Status is: Inpatient Remains inpatient appropriate because: Patient admitted with hypertensive emergency with acute aortic thrombus.  Vascular surgery recommended no intervention warranted at this time.  Monitor heart rate and blood pressure.  Patient underwent thoracocentesis for pleural effusion.  Follow-up labs    Consultants:  Vascular Cardiology PCCM  Procedures: Thoracocentesis  Antimicrobials:  Anti-infectives (From admission, onward)    Start     Dose/Rate Route Frequency Ordered Stop   01/21/23 1900  vancomycin (VANCOREADY) IVPB 750 mg/150 mL  Status:  Discontinued        750 mg 150 mL/hr over 60 Minutes Intravenous Every 48 hours 01/20/23 1002 01/20/23 1005   01/21/23 1900  vancomycin (VANCOREADY) IVPB 1250 mg/250 mL  Status:  Discontinued        1,250  mg 166.7 mL/hr over 90 Minutes Intravenous Every 48  hours 01/20/23 1005 01/21/23 0828   01/20/23 1400  piperacillin-tazobactam (ZOSYN) IVPB 3.375 g  Status:  Discontinued        3.375 g 12.5 mL/hr over 240 Minutes Intravenous Every 8 hours 01/20/23 1000 01/20/23 1005   01/20/23 1100  piperacillin-tazobactam (ZOSYN) IVPB 3.375 g  Status:  Discontinued        3.375 g 12.5 mL/hr over 240 Minutes Intravenous Every 8 hours 01/20/23 1005 01/21/23 1003         Subjective: Patient was seen and examined at bedside.  Overnight events noted.   Patient seems comfortable,  sitting on the chair,  she reports feeling better.  Objective: Vitals:   01/24/23 0750 01/24/23 0833 01/24/23 0940 01/24/23 1135  BP: 110/78 107/73  106/60  Pulse: 77 82  98  Resp: (!) 21 (!) 21  (!) 23  Temp: 98.7 F (37.1 C)   98.1 F (36.7 C)  TempSrc: Oral   Oral  SpO2: 94% 97%  95%  Weight:   73.8 kg   Height:        Intake/Output Summary (Last 24 hours) at 01/24/2023 1412 Last data filed at 01/24/2023 0600 Gross per 24 hour  Intake 879.75 ml  Output 2 ml  Net 877.75 ml   Filed Weights   01/23/23 2307 01/24/23 0332 01/24/23 0940  Weight: 73.8 kg 73.8 kg 73.8 kg    Examination:  General exam: Appears calm and comfortable, not in any acute distress, deconditioned, cortrek noted Respiratory system: Clear to auscultation. Respiratory effort normal. RR 14 Cardiovascular system: S1 & S2 heard, RRR. No JVD, murmurs, rubs, gallops or clicks. No pedal edema. Gastrointestinal system: Abdomen is nondistended, soft and nontender. Normal bowel sounds heard. Central nervous system: Alert and oriented X 3. No focal neurological deficits. Extremities: No edema, no cyanosis, no clubbing Skin: No rashes, lesions or ulcers Psychiatry: Judgement and insight appear normal. Mood & affect appropriate.     Data Reviewed: I have personally reviewed following labs and imaging studies  CBC: Recent Labs  Lab 01/19/23 1125 01/19/23 2229 01/20/23 0257 01/21/23 0233  01/22/23 0252 01/23/23 0223  WBC 50.0*  50.3* 42.0* 41.6* 38.5* 41.6* 31.1*  NEUTROABS 42.4* 40.3*  --   --   --   --   HGB 13.1  13.0 11.8* 11.1* 11.2* 11.4* 11.8*  HCT 40.9  40.7 37.5 35.0* 35.4* 35.2* 36.4  MCV 96.9  96.9 95.2 95.1 95.7 95.9 93.8  PLT 1,315*  1,296* 1,184* 1,097* 1,045* 1,046* 884*   Basic Metabolic Panel: Recent Labs  Lab 01/19/23 2229 01/20/23 0257 01/21/23 0233 01/22/23 0252 01/23/23 0223 01/23/23 1631 01/24/23 0705  NA 140 139 136 134* 134*  --  138  K 3.9 3.8 4.1 4.5 5.1  --  4.9  CL 109 107 107 107 105  --  108  CO2 21* 23 19* 15* 14*  --  19*  GLUCOSE 120* 134* 108* 106* 120*  --  154*  BUN 15 15 24* 34* 48*  --  51*  CREATININE 1.41* 1.50* 1.96* 2.29* 2.59*  --  2.35*  CALCIUM 9.3 9.3 8.9 8.9 9.1  --  9.7  MG 2.1 2.0 2.3  --   --  2.5*  --   PHOS 3.7 4.1 4.9* 4.6 4.3 3.4 2.8   GFR: Estimated Creatinine Clearance: 18.7 mL/min (A) (by C-G formula based on SCr of 2.35 mg/dL (H)). Liver Function Tests: Recent Labs  Lab  01/19/23 2229 01/22/23 0252 01/22/23 1124 01/23/23 0222 01/23/23 0223 01/23/23 1631 01/24/23 0705  AST 29  --   --  50*  --  53*  --   ALT 23  --   --  35  --  43  --   ALKPHOS 94  --   --  120  --  128*  --   BILITOT 2.4*  --   --  1.6*  --  1.2*  --   PROT 5.7*  --  5.4* 6.1*  --  5.6*  --   ALBUMIN 3.2* 2.6*  --  2.8* 2.9* 2.6* 2.6*   No results for input(s): "LIPASE", "AMYLASE" in the last 168 hours. No results for input(s): "AMMONIA" in the last 168 hours. Coagulation Profile: Recent Labs  Lab 01/19/23 1431 01/19/23 2229  INR 1.4* 1.6*   Cardiac Enzymes: No results for input(s): "CKTOTAL", "CKMB", "CKMBINDEX", "TROPONINI" in the last 168 hours. BNP (last 3 results) No results for input(s): "PROBNP" in the last 8760 hours. HbA1C: No results for input(s): "HGBA1C" in the last 72 hours. CBG: Recent Labs  Lab 01/23/23 2034 01/23/23 2306 01/24/23 0323 01/24/23 0749 01/24/23 1250  GLUCAP 180* 119* 162*  124* 159*   Lipid Profile: No results for input(s): "CHOL", "HDL", "LDLCALC", "TRIG", "CHOLHDL", "LDLDIRECT" in the last 72 hours. Thyroid Function Tests: No results for input(s): "TSH", "T4TOTAL", "FREET4", "T3FREE", "THYROIDAB" in the last 72 hours. Anemia Panel: No results for input(s): "VITAMINB12", "FOLATE", "FERRITIN", "TIBC", "IRON", "RETICCTPCT" in the last 72 hours. Sepsis Labs: Recent Labs  Lab 01/19/23 1431 01/19/23 2229 01/21/23 0822  PROCALCITON  --  0.22 0.27  LATICACIDVEN 1.6  --   --     Recent Results (from the past 240 hour(s))  Blood Culture (routine x 2)     Status: None   Collection Time: 01/19/23  2:31 PM   Specimen: BLOOD  Result Value Ref Range Status   Specimen Description BLOOD RIGHT ANTECUBITAL  Final   Special Requests   Final    BOTTLES DRAWN AEROBIC AND ANAEROBIC Blood Culture adequate volume   Culture   Final    NO GROWTH 5 DAYS Performed at [facility], [address], [place], [place] [address]    Report Status 01/24/2023 FINAL  Final  Blood Culture (routine x 2)     Status: None (Preliminary result)   Collection Time: 01/19/23  2:36 PM   Specimen: BLOOD  Result Value Ref Range Status   Specimen Description BLOOD BLOOD RIGHT FOREARM  Final   Special Requests   Final    BOTTLES DRAWN AEROBIC AND ANAEROBIC Blood Culture adequate volume   Culture  Setup Time ANAEROBIC BOTTLE ONLY  Final   Culture   Final    NO GROWTH 5 DAYS Performed at [facility], [address] [place], [place] [address]    Report Status PENDING  Incomplete  MRSA Next Gen by PCR, Nasal     Status: None   Collection Time: 01/19/23  9:17 PM   Specimen: Nasal Mucosa; Nasal Swab  Result Value Ref Range Status   MRSA by PCR Next Gen NOT DETECTED NOT DETECTED Final    Comment: (NOTE) The GeneXpert MRSA Assay (FDA approved for NASAL specimens only), is one component of a comprehensive MRSA colonization surveillance program. It is not intended to  diagnose MRSA infection nor to guide or monitor treatment for MRSA infections. Test performance is not FDA approved in patients less than 33 years old.  Performed at [facility], [address], [place], [place] [address]   Body fluid culture w Gram Stain     Status: None (Preliminary result)   Collection Time: 01/22/23  5:06 PM   Specimen: Pleural Fluid  Result Value Ref Range Status   Specimen Description FLUID PLEURAL  Final   Special Requests NONE  Final   Gram Stain   Final    WBC PRESENT, PREDOMINANTLY PMN NO ORGANISMS SEEN CYTOSPIN SMEAR    Culture   Final    NO GROWTH 2 DAYS Performed at [facility], [address], [place], [place] [address]    Report Status PENDING  Incomplete         Radiology Studies: DG Abd Portable 1V  Result Date: 01/23/2023 CLINICAL DATA:  Feeding tube placement. EXAM: PORTABLE ABDOMEN - 1 VIEW COMPARISON:  None Available. FINDINGS: Feeding tube tip in the distal stomach. Normal bowel gas pattern. Unchanged cardiomegaly and left-greater-than-right pleural effusions with bibasilar atelectasis. No acute osseous abnormality. IMPRESSION: 1. Feeding tube tip in the distal stomach. Electronically Signed   By: Obie Dredge M.D.   On: 01/23/2023 11:01   US ABDOMEN LIMITED WITH LIVER DOPPLER  Result Date: 01/22/2023 CLINICAL DATA:  Ascites. EXAM: DUPLEX ULTRASOUND OF LIVER TECHNIQUE: Color and duplex Doppler ultrasound was performed to evaluate the hepatic in-flow and out-flow vessels. COMPARISON:  CTA 01/21/2023.  Contrast CT 01/19/2023. FINDINGS: Liver: Normal parenchymal echogenicity. Normal hepatic contour without nodularity. No focal lesion, mass or intrahepatic biliary ductal dilatation. Liver appears slightly enlarged. Main Portal Vein size: 12 mm cm Portal Vein Velocities Main Prox:  24.7 cm/sec Main Mid: 32.0 cm/sec Main Dist:  20.9 cm/sec Right: 18.0 cm/sec Left: 13.0 cm/sec Hepatic Vein Velocities Right:  27.9 cm/sec Middle:   34.1 cm/sec Left:  30.1 cm/sec IVC: Preserved flow on color Doppler Hepatic Artery Velocity:  188 cm/sec Splenic Vein Velocity:  16.4 cm/sec Spleen: 13.1 cm x 6.1 cm x 6.0 cm with a total volume of 253.1 cm^3 (411 cm^3 is upper limit normal) Portal Vein Occlusion/Thrombus: No Splenic Vein Occlusion/Thrombus: No Ascites: None. Varices: None Small pleural effusions. IMPRESSION: Appropriate flow within the hepatic vasculature. Hepatosplenomegaly. Mild pleural effusions. Please correlate With multiple prior CT examinations in the last week. Electronically Signed   By: Karen Kays M.D.   On: 01/22/2023 19:44   DG Chest Port 1 View  Result Date: 01/22/2023 CLINICAL DATA:  Status post thoracentesis. EXAM: PORTABLE CHEST 1 VIEW COMPARISON:  Radiograph 01/19/2023, CT yesterday FINDINGS: No evidence of pneumothorax post thoracentesis. Left pleural effusion appears diminished with residual opacity at the left lung base. There is a small right pleural effusion and basilar opacity. Cardiomegaly is unchanged. IMPRESSION: 1. No pneumothorax post thoracentesis. 2. Decreased left pleural effusion with residual opacity at the left lung base. 3. Small right pleural effusion and basilar opacity. 4. Unchanged cardiomegaly. Electronically Signed   By: Narda Rutherford M.D.   On: 01/22/2023 18:54    Scheduled Meds:  aspirin  81 mg Oral Daily   atorvastatin  40 mg Oral Daily   carvedilol  6.25 mg Oral BID WC   Chlorhexidine Gluconate Cloth  6 each Topical Daily   feeding supplement  237 mL Oral TID BM   influenza vaccine adjuvanted  0.5 mL Intramuscular Tomorrow-1000   insulin aspart  0-9 Units Subcutaneous TID WC   pneumococcal 20-valent conjugate vaccine  0.5 mL Intramuscular Tomorrow-1000   polyethylene glycol  17 g Oral BID  sodium bicarbonate  1,300 mg Oral BID   tamsulosin  0.4 mg Oral Daily   thiamine  100 mg Per Tube Daily   Continuous Infusions:  feeding supplement (VITAL AF 1.2 CAL) 40 mL/hr at 01/24/23  0730     LOS: 5 days    Time spent: 50 mins    Willeen Niece, MD [facility]   If 7PM-7AM, please contact night-coverage

## 2023-01-25 DIAGNOSIS — I71012 Dissection of descending thoracic aorta: Secondary | ICD-10-CM | POA: Diagnosis not present

## 2023-01-25 LAB — CBC WITH DIFFERENTIAL/PLATELET
Abs Immature Granulocytes: 1.92 10*3/uL — ABNORMAL HIGH (ref 0.00–0.07)
Basophils Absolute: 0.2 10*3/uL — ABNORMAL HIGH (ref 0.0–0.1)
Basophils Relative: 0 %
Eosinophils Absolute: 0.3 10*3/uL (ref 0.0–0.5)
Eosinophils Relative: 1 %
HCT: 34.9 % — ABNORMAL LOW (ref 36.0–46.0)
Hemoglobin: 11.4 g/dL — ABNORMAL LOW (ref 12.0–15.0)
Immature Granulocytes: 6 %
Lymphocytes Relative: 4 %
Lymphs Abs: 1.4 10*3/uL (ref 0.7–4.0)
MCH: 30.1 pg (ref 26.0–34.0)
MCHC: 32.7 g/dL (ref 30.0–36.0)
MCV: 92.1 fL (ref 80.0–100.0)
Monocytes Absolute: 2.8 10*3/uL — ABNORMAL HIGH (ref 0.1–1.0)
Monocytes Relative: 8 %
Neutro Abs: 28.3 10*3/uL — ABNORMAL HIGH (ref 1.7–7.7)
Neutrophils Relative %: 81 %
Platelets: 1328 10*3/uL (ref 150–400)
RBC: 3.79 MIL/uL — ABNORMAL LOW (ref 3.87–5.11)
RDW: 16.2 % — ABNORMAL HIGH (ref 11.5–15.5)
Smear Review: INCREASED
WBC: 34.9 10*3/uL — ABNORMAL HIGH (ref 4.0–10.5)
nRBC: 0.1 % (ref 0.0–0.2)

## 2023-01-25 LAB — BASIC METABOLIC PANEL
Anion gap: 9 (ref 5–15)
BUN: 46 mg/dL — ABNORMAL HIGH (ref 8–23)
CO2: 21 mmol/L — ABNORMAL LOW (ref 22–32)
Calcium: 9.9 mg/dL (ref 8.9–10.3)
Chloride: 107 mmol/L (ref 98–111)
Creatinine, Ser: 1.48 mg/dL — ABNORMAL HIGH (ref 0.44–1.00)
GFR, Estimated: 36 mL/min — ABNORMAL LOW (ref >60)
Glucose, Bld: 152 mg/dL — ABNORMAL HIGH (ref 70–99)
Potassium: 4.8 mmol/L (ref 3.5–5.1)
Sodium: 137 mmol/L (ref 135–145)

## 2023-01-25 LAB — GLUCOSE, CAPILLARY
Glucose-Capillary: 129 mg/dL — ABNORMAL HIGH (ref 70–99)
Glucose-Capillary: 134 mg/dL — ABNORMAL HIGH (ref 70–99)
Glucose-Capillary: 149 mg/dL — ABNORMAL HIGH (ref 70–99)
Glucose-Capillary: 156 mg/dL — ABNORMAL HIGH (ref 70–99)
Glucose-Capillary: 166 mg/dL — ABNORMAL HIGH (ref 70–99)
Glucose-Capillary: 188 mg/dL — ABNORMAL HIGH (ref 70–99)

## 2023-01-25 LAB — CBC
HCT: 35.1 % — ABNORMAL LOW (ref 36.0–46.0)
Hemoglobin: 11.7 g/dL — ABNORMAL LOW (ref 12.0–15.0)
MCH: 30.5 pg (ref 26.0–34.0)
MCHC: 33.3 g/dL (ref 30.0–36.0)
MCV: 91.4 fL (ref 80.0–100.0)
Platelets: 1335 10*3/uL (ref 150–400)
RBC: 3.84 MIL/uL — ABNORMAL LOW (ref 3.87–5.11)
RDW: 16.1 % — ABNORMAL HIGH (ref 11.5–15.5)
WBC: 36.4 10*3/uL — ABNORMAL HIGH (ref 4.0–10.5)
nRBC: 0.1 % (ref 0.0–0.2)

## 2023-01-25 LAB — PROCALCITONIN: Procalcitonin: 0.48 ng/mL

## 2023-01-25 LAB — PHOSPHORUS: Phosphorus: 2.2 mg/dL — ABNORMAL LOW (ref 2.5–4.6)

## 2023-01-25 LAB — MAGNESIUM: Magnesium: 2.5 mg/dL — ABNORMAL HIGH (ref 1.7–2.4)

## 2023-01-25 MED ORDER — MELATONIN 3 MG PO TABS
3.0000 mg | ORAL_TABLET | Freq: Every day | ORAL | Status: DC
  Administered 2023-01-25 – 2023-01-26 (×2): 3 mg via ORAL
  Filled 2023-01-25 (×2): qty 1

## 2023-01-25 MED ORDER — HYDROXYUREA 500 MG PO CAPS
1000.0000 mg | ORAL_CAPSULE | Freq: Two times a day (BID) | ORAL | Status: DC
Start: 2023-01-25 — End: 2023-01-27
  Administered 2023-01-25 – 2023-01-27 (×5): 1000 mg via ORAL
  Filled 2023-01-25 (×6): qty 2

## 2023-01-25 NOTE — Plan of Care (Signed)
  Problem: Education: Goal: Knowledge of General Education information will improve Description: Including pain rating scale, medication(s)/side effects and non-pharmacologic comfort measures Outcome: Progressing   Problem: Health Behavior/Discharge Planning: Goal: Ability to manage health-related needs will improve Outcome: Progressing   Problem: Clinical Measurements: Goal: Ability to maintain clinical measurements within normal limits will improve Outcome: Progressing Goal: Will remain free from infection Outcome: Progressing Goal: Diagnostic test results will improve Outcome: Progressing Goal: Respiratory complications will improve Outcome: Progressing Goal: Cardiovascular complication will be avoided Outcome: Progressing   Problem: Activity: Goal: Risk for activity intolerance will decrease Outcome: Progressing   Problem: Nutrition: Goal: Adequate nutrition will be maintained Outcome: Progressing   Problem: Coping: Goal: Level of anxiety will decrease Outcome: Progressing   Problem: Elimination: Goal: Will not experience complications related to bowel motility Outcome: Progressing Goal: Will not experience complications related to urinary retention Outcome: Progressing   Problem: Safety: Goal: Ability to remain free from injury will improve Outcome: Progressing   Problem: Skin Integrity: Goal: Risk for impaired skin integrity will decrease Outcome: Progressing   Problem: Health Behavior/Discharge Planning: Goal: Ability to identify and utilize available resources and services will improve Outcome: Progressing Goal: Ability to manage health-related needs will improve Outcome: Progressing

## 2023-01-25 NOTE — Progress Notes (Signed)
PROGRESS NOTE    Emelee Kieper  NGE:[id number] DOB: Jul 04, 1945 DOA: 01/19/2023 PCP: Patient, No Pcp Per   Brief Narrative:  This 77 years old female with PMH significant for hypertension, hyperlipidemia, A-fib on Eliquis, CVA, thrombocytosis presented to the [facility] ED with complaints of chest pain, intermittent abdominal pain and generalized weakness.  Patient was hypertensive and in A-fib with RVR on arrival in the ED.  WBC 50K, BNP 597, UA unremarkable, chest x-ray finding consistent with CHF, pulmonary edema.  CTA chest showing aneurysmal disease of thoracic aorta with acute intramural hemorrhage with continuation into descending aorta likely and before the origin of renal arteries.  Lower extremity duplex negative for DVT.  Cardiothoracic surgery was consulted, vascular surgery was consulted recommended condition is nonoperative.  Patient remained admitted in ICU. TRH pickup 11/23.  Assessment & Plan:   Principal Problem:   Descending thoracic aortic dissection (HCC) Active Problems:   Pleural effusion   Hypertensive emergency   HFrEF (heart failure with reduced ejection fraction) (HCC)   PAF (paroxysmal atrial fibrillation) (HCC)   Malnutrition of moderate degree  Hypertensive emergency with acute aortic thrombus: Monitor blood pressure and heart rate control. Continue Coreg for blood pressure and rhythm control. Goal SBP <120, HR  <60.  Amlodipine is on hold. Appreciate vascular surgery consultation. Continue Lipitor 40 mg daily. BP improved.   Newly diagnosed HFrEF: Patient has significant leg edema. Hold diuretics with renal failure & poor PO intake. Unable to tolerate ARB and spironolactone due to AKI   AKI with metabolic acidosis: Most likely due to sudden drop in BP and inability to autoregulate. Lokelma given,  potassium improved. Continue bicarb supplementation Monitor renal functions, slightly improving Cautious with diuresis to hopefully ensure adequate renal  preload with her poor PO intake   Paroxysmal Afib Holding AC for about a month per VS Continue tele monitoring monitor electrolytes   Thrombocytosis, neutrophilic leukocytosis: Concerning for hematologic malignancy. JAK2+ Monitor CBC Continue aspirin daily. Oncology consulted and started patient on hydroxyurea 1000 mg twice daily.   Hyperbilirubinemia: Elevated transaminase-  Likely congestive heart failure Follow up RUQ  Moderate protein energy malnutrition: Continue cortrak feeds , start TF due to lack of adequate PO intake -ad lib PO intake   Hyperglycemia Continue SSI PRN goal BG 140-180   Prediabetes, A1c 6.1 OP follow up recommended SSI PRN   Partially loculated pleural effusion- exudative (high LDH, neutrophilic-- similar to peripheral blood, but overall low cell count is more concerning for malignancy than acute infectious etiology, although lymphocytic effusions are still the more common presentation of MPE) Follow-up flow cytometry. Follow pleural fluid culture Monitor for recurrence of effusion.   DVT prophylaxis:  SCDs Code Status: Full code Family Communication: (Son at bed side. Disposition Plan:    Status is: Inpatient Remains inpatient appropriate because: Patient admitted with hypertensive emergency with acute aortic thrombus.  Vascular surgery recommended no intervention warranted at this time.  Monitor heart rate and blood pressure.  Patient underwent thoracocentesis for pleural effusion.  Follow-up labs    Consultants:  Vascular Cardiology PCCM  Procedures: Thoracocentesis  Antimicrobials:  Anti-infectives (From admission, onward)    Start     Dose/Rate Route Frequency Ordered Stop   01/21/23 1900  vancomycin (VANCOREADY) IVPB 750 mg/150 mL  Status:  Discontinued        750 mg 150 mL/hr over 60 Minutes Intravenous Every 48 hours 01/20/23 1002 01/20/23 1005   01/21/23 1900  vancomycin (VANCOREADY) IVPB 1250 mg/250 mL  Status:  Discontinued         1,250 mg 166.7 mL/hr over 90 Minutes Intravenous Every 48 hours 01/20/23 1005 01/21/23 0828   01/20/23 1400  piperacillin-tazobactam (ZOSYN) IVPB 3.375 g  Status:  Discontinued        3.375 g 12.5 mL/hr over 240 Minutes Intravenous Every 8 hours 01/20/23 1000 01/20/23 1005   01/20/23 1100  piperacillin-tazobactam (ZOSYN) IVPB 3.375 g  Status:  Discontinued        3.375 g 12.5 mL/hr over 240 Minutes Intravenous Every 8 hours 01/20/23 1005 01/21/23 1003         Subjective: Patient was seen and examined at bedside.  Overnight events noted.   Patient seems comfortable,  sitting on the chair,  she reports feeling better. Patient denies any chest pain or shortness of breath.  Objective: Vitals:   01/25/23 0343 01/25/23 0430 01/25/23 0806 01/25/23 1203  BP: 121/65  (!) 142/90 128/80  Pulse: 86  80 84  Resp: 20  (!) 22 (!) 22  Temp: 98.8 F (37.1 C)  98.5 F (36.9 C) 98.6 F (37 C)  TempSrc: Oral  Oral Oral  SpO2: 95%  95% 95%  Weight:  73.9 kg    Height:        Intake/Output Summary (Last 24 hours) at 01/25/2023 1433 Last data filed at 01/25/2023 0942 Gross per 24 hour  Intake 1305.25 ml  Output 840 ml  Net 465.25 ml   Filed Weights   01/24/23 0332 01/24/23 0940 01/25/23 0430  Weight: 73.8 kg 73.8 kg 73.9 kg    Examination:  General exam: Appears calm and comfortable, deconditioned, cortrek noted. Respiratory system: Clear to auscultation. Respiratory effort normal. RR 13 Cardiovascular system: S1 & S2 heard, RRR. No JVD, murmurs, rubs, gallops or clicks. No pedal edema. Gastrointestinal system: Abdomen is nondistended, soft and nontender. Normal bowel sounds heard. Central nervous system: Alert and oriented X 3. No focal neurological deficits. Extremities: No edema, no cyanosis, no clubbing Skin: No rashes, lesions or ulcers Psychiatry: Judgement and insight appear normal. Mood & affect appropriate.     Data Reviewed: I have personally reviewed following  labs and imaging studies  CBC: Recent Labs  Lab 01/19/23 1125 01/19/23 2229 01/20/23 0257 01/21/23 0233 01/22/23 0252 01/23/23 0223 01/25/23 0701 01/25/23 0802  WBC 50.0*  50.3* 42.0*   < > 38.5* 41.6* 31.1* 36.4* 34.9*  NEUTROABS 42.4* 40.3*  --   --   --   --   --  28.3*  HGB 13.1  13.0 11.8*   < > 11.2* 11.4* 11.8* 11.7* 11.4*  HCT 40.9  40.7 37.5   < > 35.4* 35.2* 36.4 35.1* 34.9*  MCV 96.9  96.9 95.2   < > 95.7 95.9 93.8 91.4 92.1  PLT 1,315*  1,296* 1,184*   < > 1,045* 1,046* 884* 1,335* 1,328*   < > = values in this interval not displayed.   Basic Metabolic Panel: Recent Labs  Lab 01/20/23 0257 01/21/23 0233 01/22/23 0252 01/23/23 0223 01/23/23 1631 01/24/23 0705 01/24/23 1722 01/25/23 0701  NA 139 136 134* 134*  --  138  --  137  K 3.8 4.1 4.5 5.1  --  4.9  --  4.8  CL 107 107 107 105  --  108  --  107  CO2 23 19* 15* 14*  --  19*  --  21*  GLUCOSE 134* 108* 106* 120*  --  154*  --  152*  BUN 15 24* 34* 48*  --  51*  --  46*  CREATININE 1.50* 1.96* 2.29* 2.59*  --  2.35*  --  1.48*  CALCIUM 9.3 8.9 8.9 9.1  --  9.7  --  9.9  MG 2.0 2.3  --   --  2.5*  --  2.5* 2.5*  PHOS 4.1 4.9* 4.6 4.3 3.4 2.8 2.3* 2.2*   GFR: Estimated Creatinine Clearance: 29.7 mL/min (A) (by C-G formula based on SCr of 1.48 mg/dL (H)). Liver Function Tests: Recent Labs  Lab 01/19/23 2229 01/22/23 0252 01/22/23 1124 01/23/23 0222 01/23/23 0223 01/23/23 1631 01/24/23 0705  AST 29  --   --  50*  --  53*  --   ALT 23  --   --  35  --  43  --   ALKPHOS 94  --   --  120  --  128*  --   BILITOT 2.4*  --   --  1.6*  --  1.2*  --   PROT 5.7*  --  5.4* 6.1*  --  5.6*  --   ALBUMIN 3.2* 2.6*  --  2.8* 2.9* 2.6* 2.6*   No results for input(s): "LIPASE", "AMYLASE" in the last 168 hours. No results for input(s): "AMMONIA" in the last 168 hours. Coagulation Profile: Recent Labs  Lab 01/19/23 1431 01/19/23 2229  INR 1.4* 1.6*   Cardiac Enzymes: No results for input(s):  "CKTOTAL", "CKMB", "CKMBINDEX", "TROPONINI" in the last 168 hours. BNP (last 3 results) No results for input(s): "PROBNP" in the last 8760 hours. HbA1C: No results for input(s): "HGBA1C" in the last 72 hours. CBG: Recent Labs  Lab 01/24/23 1645 01/24/23 1958 01/25/23 0553 01/25/23 0830 01/25/23 1206  GLUCAP 161* 151* 149* 134* 166*   Lipid Profile: No results for input(s): "CHOL", "HDL", "LDLCALC", "TRIG", "CHOLHDL", "LDLDIRECT" in the last 72 hours. Thyroid Function Tests: No results for input(s): "TSH", "T4TOTAL", "FREET4", "T3FREE", "THYROIDAB" in the last 72 hours. Anemia Panel: No results for input(s): "VITAMINB12", "FOLATE", "FERRITIN", "TIBC", "IRON", "RETICCTPCT" in the last 72 hours. Sepsis Labs: Recent Labs  Lab 01/19/23 1431 01/19/23 2229 01/21/23 0822 01/23/23 1631  PROCALCITON  --  0.22 0.27 0.48  LATICACIDVEN 1.6  --   --   --     Recent Results (from the past 240 hour(s))  Blood Culture (routine x 2)     Status: None   Collection Time: 01/19/23  2:31 PM   Specimen: BLOOD  Result Value Ref Range Status   Specimen Description BLOOD RIGHT ANTECUBITAL  Final   Special Requests   Final    BOTTLES DRAWN AEROBIC AND ANAEROBIC Blood Culture adequate volume   Culture   Final    NO GROWTH 5 DAYS Performed at [facility], [address] [place], [place] [address]    Report Status 01/24/2023 FINAL  Final  Blood Culture (routine x 2)     Status: None (Preliminary result)   Collection Time: 01/19/23  2:36 PM   Specimen: BLOOD  Result Value Ref Range Status   Specimen Description BLOOD BLOOD RIGHT FOREARM  Final   Special Requests   Final    BOTTLES DRAWN AEROBIC AND ANAEROBIC Blood Culture adequate volume   Culture  Setup Time ANAEROBIC BOTTLE ONLY  Final   Culture   Final    NO GROWTH 5 DAYS Performed at [facility], [address], [place], [place] [address]    Report Status PENDING  Incomplete  MRSA Next Gen by PCR, Nasal  Status: None   Collection Time: 01/19/23  9:17 PM   Specimen: Nasal Mucosa; Nasal Swab  Result Value Ref Range Status   MRSA by PCR Next Gen NOT DETECTED NOT DETECTED Final    Comment: (NOTE) The GeneXpert MRSA Assay (FDA approved for NASAL specimens only), is one component of a comprehensive MRSA colonization surveillance program. It is not intended to diagnose MRSA infection nor to guide or monitor treatment for MRSA infections. Test performance is not FDA approved in patients less than 49 years old. Performed at [facility], [address] [place], [place] [address]   Body fluid culture w Gram Stain     Status: None (Preliminary result)   Collection Time: 01/22/23  5:06 PM   Specimen: Pleural Fluid  Result Value Ref Range Status   Specimen Description FLUID PLEURAL  Final   Special Requests NONE  Final   Gram Stain   Final    WBC PRESENT, PREDOMINANTLY PMN NO ORGANISMS SEEN CYTOSPIN SMEAR    Culture   Final    NO GROWTH 3 DAYS Performed at [facility], [address] [place], [place] [address]    Report Status PENDING  Incomplete         Radiology Studies: VAS [place] UPPER EXTREMITY VENOUS DUPLEX  Result Date: 01/25/2023 UPPER VENOUS STUDY  Patient Name:  LEYONA FRAYRE  Date of Exam:   01/24/2023 Medical Rec #: [id number]         Accession #:    [id number] Date of Birth: [birth year]         Patient Gender: F Patient Age:   30 years Exam Location:  [facility] Procedure:      VAS [place] UPPER EXTREMITY VENOUS DUPLEX Referring Phys: Maudry Zeidan KHATRI --------------------------------------------------------------------------------  Indications: Swelling, Edema, and Rule out DVT Comparison Study: No prior exam. Performing Technologist: Fernande Bras  Examination Guidelines: A complete evaluation includes B-mode imaging, spectral Doppler, color Doppler, and power Doppler as needed of all accessible portions of each vessel. Bilateral testing is considered an  integral part of a complete examination. Limited examinations for reoccurring indications may be performed as noted.  Right Findings: +----------+------------+---------+-----------+----------+-------+ RIGHT     CompressiblePhasicitySpontaneousPropertiesSummary +----------+------------+---------+-----------+----------+-------+ IJV           Full       Yes       Yes                      +----------+------------+---------+-----------+----------+-------+ Subclavian    Full       Yes       Yes                      +----------+------------+---------+-----------+----------+-------+ Axillary      Full       Yes       Yes                      +----------+------------+---------+-----------+----------+-------+ Brachial      Full       Yes       Yes                      +----------+------------+---------+-----------+----------+-------+ Radial      Partial      Yes       Yes              Limited +----------+------------+---------+-----------+----------+-------+ Ulnar  Full       Yes       Yes                      +----------+------------+---------+-----------+----------+-------+ Cephalic      None       No        No                       +----------+------------+---------+-----------+----------+-------+ Basilic       Full       Yes       Yes                      +----------+------------+---------+-----------+----------+-------+ Innominate    Full       Yes       Yes                      +----------+------------+---------+-----------+----------+-------+ Right radial vein unable to properly compresss due to swelling in lateral arm possibly due to SVT in cephalic v. Patent by color and pulsed wave doppler.  Left Findings: +----------+------------+---------+-----------+----------+-------+ LEFT      CompressiblePhasicitySpontaneousPropertiesSummary +----------+------------+---------+-----------+----------+-------+ Subclavian               Yes        Yes                      +----------+------------+---------+-----------+----------+-------+  Summary:  Right: Findings consistent with age indeterminate superficial vein thrombosis involving the right cephalic vein.  Left: No evidence of thrombosis in the subclavian.  *See table(s) above for measurements and observations.  Diagnosing physician: Gerarda Fraction Electronically signed by Gerarda Fraction on 01/25/2023 at 12:33:36 PM.    Final     Scheduled Meds:  aspirin  81 mg Oral Daily   atorvastatin  40 mg Oral Daily   carvedilol  6.25 mg Oral BID WC   Chlorhexidine Gluconate Cloth  6 each Topical Daily   feeding supplement  237 mL Oral TID BM   hydroxyurea  1,000 mg Oral BID   influenza vaccine adjuvanted  0.5 mL Intramuscular Tomorrow-1000   insulin aspart  0-9 Units Subcutaneous TID WC   melatonin  3 mg Oral QHS   pneumococcal 20-valent conjugate vaccine  0.5 mL Intramuscular Tomorrow-1000   polyethylene glycol  17 g Oral BID   sodium bicarbonate  1,300 mg Oral BID   tamsulosin  0.4 mg Oral Daily   thiamine  100 mg Per Tube Daily   Continuous Infusions:  feeding supplement (VITAL AF 1.2 CAL) 55 mL/hr at 01/25/23 0800     LOS: 6 days    Time spent: 35 mins    Willeen Niece, MD [facility]   If 7PM-7AM, please contact night-coverage

## 2023-01-25 NOTE — Progress Notes (Addendum)
  Progress Note    01/25/2023 12:08 PM * No surgery found *  Subjective:  No pain   Vitals:   01/25/23 0806 01/25/23 1203  BP: (!) 142/90 128/80  Pulse: 80 84  Resp: (!) 22 (!) 22  Temp: 98.5 F (36.9 C) 98.6 F (37 C)  SpO2: 95% 95%   Physical Exam: Lungs:  non labored Extremities:  palpable L DP; R foot warm and well perfused with motor and sensation intact Abdomen:  soft, NT, ND Neurologic: A&O  CBC    Component Value Date/Time   WBC 34.9 (H) 01/25/2023 0802   RBC 3.79 (L) 01/25/2023 0802   HGB 11.4 (L) 01/25/2023 0802   HCT 34.9 (L) 01/25/2023 0802   PLT 1,328 (HH) 01/25/2023 0802   MCV 92.1 01/25/2023 0802   MCH 30.1 01/25/2023 0802   MCHC 32.7 01/25/2023 0802   RDW 16.2 (H) 01/25/2023 0802   LYMPHSABS 1.4 01/25/2023 0802   MONOABS 2.8 (H) 01/25/2023 0802   EOSABS 0.3 01/25/2023 0802   BASOSABS 0.2 (H) 01/25/2023 0802    BMET    Component Value Date/Time   NA 137 01/25/2023 0701   K 4.8 01/25/2023 0701   CL 107 01/25/2023 0701   CO2 21 (L) 01/25/2023 0701   GLUCOSE 152 (H) 01/25/2023 0701   BUN 46 (H) 01/25/2023 0701   CREATININE 1.48 (H) 01/25/2023 0701   CALCIUM 9.9 01/25/2023 0701   GFRNONAA 36 (L) 01/25/2023 0701   GFRAA >60 10/31/2019 0920    INR    Component Value Date/Time   INR 1.6 (H) 01/19/2023 2229     Intake/Output Summary (Last 24 hours) at 01/25/2023 1208 Last data filed at 01/25/2023 0942 Gross per 24 hour  Intake 1305.25 ml  Output 840 ml  Net 465.25 ml     Assessment/Plan:  77 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 I had a long discussion regarding her aortic intramural hematoma with her son, who came into town from [place].  We discussed that the best course of action is medical management as long as she remains asymptomatic.  My plan is to see her in 1 month's time with repeat imaging.  I am hopeful that there will be some aortic remodeling.  Should this degenerate into an aneurysm,  we will discuss options moving forward.  Victorino Sparrow MD [facility] Phone Number: [phone] 01/25/2023 12:10 PM

## 2023-01-25 NOTE — Consult Note (Signed)
[name] [phone]   [phone]  CONSULT NOTE  REFERRING PHYSICIAN: Dr. Willeen Niece  REASON FOR CONSULTATION:  77 years old African-American female with thrombocytosis  HPI Kristy Sullivan is a 77 y.o. female. Kristy Sullivan, a 77 year old patient with a history of JAK2 mutation positive Essential Thrombocythemia, presented to the hospital after experiencing a general feeling of unwellness and shortness of breath. The patient reported feeling unwell while preparing for work, which prompted a family member to call an ambulance.  The patient was previously diagnosed with Essential Thrombocythemia by Dr. Donneta Romberg and was prescribed aspirin and hydroxyurea as treatment. However, the patient admitted to discontinuing the medication three years ago, not due to side effects, but out of personal choice.  Upon admission, it was discovered that the patient had an acute thoracic aneurysm with intramural thrombus, indicating bleeding in the wall of the aorta. The patient also has a history of congestive heart failure and atrial fibrillation, which occurs intermittently.  The patient also reported a lack of appetite, which led to the placement of a nasogastric tube for nutritional support. Family members at the bedside noted a drop in the patient's weight and a change in appetite.  The patient also had a left pleural effusion, which was drained. The patient was not experiencing any headaches, changes in vision, or night sweats at the time of admission. HPI  Past Medical History:  Diagnosis Date   Embolic stroke (HCC)    a. 09/2019 MRI: several punctate acute infarcts in R parietal lobe affecting the cortical and subcortical brain consistent w/ micro embolic infarcts in R MCA branch vessel distribution.   Hyperlipidemia    Hypertension    Persistent atrial fibrillation (HCC)    a. 09/2019 Dx in setting of ED visit for weakness-->embolic stroke; b.  CHA2DS2VASc = 6-->eliquis; c. 09/2019 Echo: EF 50-55%, no rwma, mild LVH. Nl RV size/fxn. Mild LAE, mod RAE. Mild to mod MR.   Thrombocythemia, essential (HCC)    JAK2 positive    History reviewed. No pertinent surgical history.  Family History  Problem Relation Age of Onset   High blood pressure Mother    Hypertension Brother     Social History Social History   Tobacco Use   Smoking status: Never   Smokeless tobacco: Never  Vaping Use   Vaping status: Never Used  Substance Use Topics   Alcohol use: Never   Drug use: Never    No Known Allergies  Current Facility-Administered Medications  Medication Dose Route Frequency Provider Last Rate Last Admin   aspirin chewable tablet 81 mg  81 mg Oral Daily Desai, Rahul P, PA-C   81 mg at 01/25/23 0833   atorvastatin (LIPITOR) tablet 40 mg  40 mg Oral Daily Desai, Rahul P, PA-C   40 mg at 01/25/23 0833   carvedilol (COREG) tablet 6.25 mg  6.25 mg Oral BID WC Selmer Dominion B, NP   6.25 mg at 01/25/23 5643   Chlorhexidine Gluconate Cloth 2 % PADS 6 each  6 each Topical Daily Steffanie Dunn, DO   6 each at 01/25/23 1000   docusate sodium (COLACE) capsule 100 mg  100 mg Oral BID PRN Tim Lair, PA-C       feeding supplement (ENSURE ENLIVE / ENSURE PLUS) liquid 237 mL  237 mL Oral TID BM Karie Fetch P, DO   237 mL at 01/25/23 0853   feeding supplement (VITAL AF 1.2 CAL) liquid 1,000 mL  1,000  mL Per Tube Continuous Steffanie Dunn, DO 55 mL/hr at 01/25/23 0800 Infusion Verify at 01/25/23 0800   influenza vaccine adjuvanted (FLUAD) injection 0.5 mL  0.5 mL Intramuscular Tomorrow-1000 Karie Fetch P, DO       insulin aspart (novoLOG) injection 0-9 Units  0-9 Units Subcutaneous TID WC Steffanie Dunn, DO   2 Units at 01/25/23 1235   melatonin tablet 3 mg  3 mg Oral QHS Willeen Niece, MD       phenol (CHLORASEPTIC) mouth spray 1 spray  1 spray Mouth/Throat PRN Willeen Niece, MD   1 spray at 01/24/23 0930   pneumococcal 20-valent  conjugate vaccine (PREVNAR 20) injection 0.5 mL  0.5 mL Intramuscular Tomorrow-1000 Karie Fetch P, DO       polyethylene glycol (MIRALAX / GLYCOLAX) packet 17 g  17 g Oral BID Selmer Dominion B, NP   17 g at 01/23/23 2203   sodium bicarbonate tablet 1,300 mg  1,300 mg Oral BID Selmer Dominion B, NP   1,300 mg at 01/25/23 8469   tamsulosin (FLOMAX) capsule 0.4 mg  0.4 mg Oral Daily Steffanie Dunn, DO   0.4 mg at 01/25/23 6295   thiamine (VITAMIN B1) tablet 100 mg  100 mg Per Tube Daily Karie Fetch P, DO   100 mg at 01/25/23 2841    Review of Systems  Constitutional: positive for anorexia, fatigue, and weight loss Eyes: negative Ears, nose, mouth, throat, and face: negative Respiratory: positive for dyspnea on exertion Cardiovascular: negative Gastrointestinal: negative Genitourinary:negative Integument/breast: negative Hematologic/lymphatic: negative Musculoskeletal:negative Neurological: negative Behavioral/Psych: negative Endocrine: negative Allergic/Immunologic: negative  Physical Exam  LKG:MWNUU, healthy, no distress, well nourished, and well developed SKIN: skin color, texture, turgor are normal, no rashes or significant lesions HEAD: Normocephalic, No masses, lesions, tenderness or abnormalities EYES: normal, PERRLA, Conjunctiva are pink and non-injected EARS: External ears normal, Canals clear OROPHARYNX:no exudate, no erythema, and lips, buccal mucosa, and tongue normal  NECK: supple, no adenopathy, no JVD LYMPH:  no palpable lymphadenopathy, no hepatosplenomegaly BREAST:not examined LUNGS: clear to auscultation , and palpation HEART: regular rate & rhythm, no murmurs, and no gallops ABDOMEN:abdomen soft, non-tender, normal bowel sounds, and no masses or organomegaly BACK: Back symmetric, no curvature., No CVA tenderness EXTREMITIES:no joint deformities, effusion, or inflammation, no edema  NEURO: alert & oriented x 3 with fluent speech, no focal motor/sensory  deficits  PERFORMANCE STATUS: ECOG 1  LABORATORY DATA: Lab Results  Component Value Date   WBC 34.9 (H) 01/25/2023   HGB 11.4 (L) 01/25/2023   HCT 34.9 (L) 01/25/2023   MCV 92.1 01/25/2023   PLT 1,328 (HH) 01/25/2023    @LASTCHEM @  RADIOGRAPHIC STUDIES: VAS [place] UPPER EXTREMITY VENOUS DUPLEX  Result Date: 01/25/2023 UPPER VENOUS STUDY  Patient Name:  LENIAH WILLETTE  Date of Exam:   01/24/2023 Medical Rec #: [id number]         Accession #:    [id number] Date of Birth: 1945-03-14         Patient Gender: F Patient Age:   38 years Exam Location:  [facility] Procedure:      VAS [place] UPPER EXTREMITY VENOUS DUPLEX Referring Phys: PARDEEP KHATRI --------------------------------------------------------------------------------  Indications: Swelling, Edema, and Rule out DVT Comparison Study: No prior exam. Performing Technologist: Fernande Bras  Examination Guidelines: A complete evaluation includes B-mode imaging, spectral Doppler, color Doppler, and power Doppler as needed of all accessible portions of each vessel. Bilateral testing is considered an integral part of a complete  examination. Limited examinations for reoccurring indications may be performed as noted.  Right Findings: +----------+------------+---------+-----------+----------+-------+ RIGHT     CompressiblePhasicitySpontaneousPropertiesSummary +----------+------------+---------+-----------+----------+-------+ IJV           Full       Yes       Yes                      +----------+------------+---------+-----------+----------+-------+ Subclavian    Full       Yes       Yes                      +----------+------------+---------+-----------+----------+-------+ Axillary      Full       Yes       Yes                      +----------+------------+---------+-----------+----------+-------+ Brachial      Full       Yes       Yes                       +----------+------------+---------+-----------+----------+-------+ Radial      Partial      Yes       Yes              Limited +----------+------------+---------+-----------+----------+-------+ Ulnar         Full       Yes       Yes                      +----------+------------+---------+-----------+----------+-------+ Cephalic      None       No        No                       +----------+------------+---------+-----------+----------+-------+ Basilic       Full       Yes       Yes                      +----------+------------+---------+-----------+----------+-------+ Innominate    Full       Yes       Yes                      +----------+------------+---------+-----------+----------+-------+ Right radial vein unable to properly compresss due to swelling in lateral arm possibly due to SVT in cephalic v. Patent by color and pulsed wave doppler.  Left Findings: +----------+------------+---------+-----------+----------+-------+ LEFT      CompressiblePhasicitySpontaneousPropertiesSummary +----------+------------+---------+-----------+----------+-------+ Subclavian               Yes       Yes                      +----------+------------+---------+-----------+----------+-------+  Summary:  Right: Findings consistent with age indeterminate superficial vein thrombosis involving the right cephalic vein.  Left: No evidence of thrombosis in the subclavian.  *See table(s) above for measurements and observations.  Diagnosing physician: Gerarda Fraction Electronically signed by Gerarda Fraction on 01/25/2023 at 12:33:36 PM.    Final    DG Abd Portable 1V  Result Date: 01/23/2023 CLINICAL DATA:  Feeding tube placement. EXAM: PORTABLE ABDOMEN - 1 VIEW COMPARISON:  None Available. FINDINGS: Feeding tube tip in the distal stomach. Normal bowel gas pattern. Unchanged cardiomegaly and left-greater-than-right pleural effusions with bibasilar atelectasis. No acute osseous abnormality.  IMPRESSION:  1. Feeding tube tip in the distal stomach. Electronically Signed   By: Obie Dredge M.D.   On: 01/23/2023 11:01   US ABDOMEN LIMITED WITH LIVER DOPPLER  Result Date: 01/22/2023 CLINICAL DATA:  Ascites. EXAM: DUPLEX ULTRASOUND OF LIVER TECHNIQUE: Color and duplex Doppler ultrasound was performed to evaluate the hepatic in-flow and out-flow vessels. COMPARISON:  CTA 01/21/2023.  Contrast CT 01/19/2023. FINDINGS: Liver: Normal parenchymal echogenicity. Normal hepatic contour without nodularity. No focal lesion, mass or intrahepatic biliary ductal dilatation. Liver appears slightly enlarged. Main Portal Vein size: 12 mm cm Portal Vein Velocities Main Prox:  24.7 cm/sec Main Mid: 32.0 cm/sec Main Dist:  20.9 cm/sec Right: 18.0 cm/sec Left: 13.0 cm/sec Hepatic Vein Velocities Right:  27.9 cm/sec Middle:  34.1 cm/sec Left:  30.1 cm/sec IVC: Preserved flow on color Doppler Hepatic Artery Velocity:  188 cm/sec Splenic Vein Velocity:  16.4 cm/sec Spleen: 13.1 cm x 6.1 cm x 6.0 cm with a total volume of 253.1 cm^3 (411 cm^3 is upper limit normal) Portal Vein Occlusion/Thrombus: No Splenic Vein Occlusion/Thrombus: No Ascites: None. Varices: None Small pleural effusions. IMPRESSION: Appropriate flow within the hepatic vasculature. Hepatosplenomegaly. Mild pleural effusions. Please correlate With multiple prior CT examinations in the last week. Electronically Signed   By: Karen Kays M.D.   On: 01/22/2023 19:44   DG Chest Port 1 View  Result Date: 01/22/2023 CLINICAL DATA:  Status post thoracentesis. EXAM: PORTABLE CHEST 1 VIEW COMPARISON:  Radiograph 01/19/2023, CT yesterday FINDINGS: No evidence of pneumothorax post thoracentesis. Left pleural effusion appears diminished with residual opacity at the left lung base. There is a small right pleural effusion and basilar opacity. Cardiomegaly is unchanged. IMPRESSION: 1. No pneumothorax post thoracentesis. 2. Decreased left pleural effusion with residual  opacity at the left lung base. 3. Small right pleural effusion and basilar opacity. 4. Unchanged cardiomegaly. Electronically Signed   By: Narda Rutherford M.D.   On: 01/22/2023 18:54   CT Angio Chest/Abd/Pel for Dissection W and/or W/WO  Result Date: 01/21/2023 CLINICAL DATA:  Follow-up thoracic aortic intramural hematoma EXAM: CT ANGIOGRAPHY CHEST, ABDOMEN AND PELVIS TECHNIQUE: Non-contrast CT of the chest was initially obtained. Multidetector CT imaging through the chest, abdomen and pelvis was performed using the standard protocol during bolus administration of intravenous contrast. Multiplanar reconstructed images and MIPs were obtained and reviewed to evaluate the vascular anatomy. RADIATION DOSE REDUCTION: This exam was performed according to the departmental dose-optimization program which includes automated exposure control, adjustment of the mA and/or kV according to patient size and/or use of iterative reconstruction technique. CONTRAST:  75mL OMNIPAQUE IOHEXOL 350 MG/ML SOLN COMPARISON:  CT chest angiogram, 01/19/2023 FINDINGS: CTA CHEST FINDINGS VASCULAR Aorta: Satisfactory opacification of the aorta. Unchanged contour and caliber of the descending thoracic aorta again with a posterior intramural hematoma arising just distal to the left subclavian artery origin. Maximum caliber of the proximal descending thoracic aorta 3.8 x 3.8 cm (series 7, image 55). Cardiovascular: No evidence of pulmonary embolism on limited non-tailored examination. Gross cardiomegaly. Unchanged small pericardial effusion. Review of the MIP images confirms the above findings. NON VASCULAR Mediastinum/Nodes: No enlarged mediastinal, hilar, or axillary lymph nodes. Thyroid gland, trachea, and esophagus demonstrate no significant findings. Lungs/Pleura: Moderate left, small right pleural effusions, increased in volume compared to prior examination. Musculoskeletal: No chest wall abnormality. No acute osseous findings. Review of  the MIP images confirms the above findings. CTA ABDOMEN AND PELVIS FINDINGS VASCULAR Intramural hematoma extends into the upper abdominal aorta, to approximately the level  of the celiac origin, maximum caliber of the vessel in this vicinity 3.3 x 3.1 cm (series 7, image 134). Standard branching pattern of the abdominal aorta with solitary bilateral renal arteries. Review of the MIP images confirms the above findings. NON-VASCULAR Hepatobiliary: No solid liver abnormality is seen. No gallstones, gallbladder wall thickening, or biliary dilatation. Pancreas: Unremarkable. No pancreatic ductal dilatation or surrounding inflammatory changes. Spleen: Mild splenomegaly, maximum span 13.3 cm. Adrenals/Urinary Tract: Adrenal glands are unremarkable. Under rotated kidneys. Kidneys are otherwise normal, without renal calculi, solid lesion, or hydronephrosis. Bladder is unremarkable. Stomach/Bowel: Stomach is within normal limits. Appendix not clearly visualized. No evidence of bowel wall thickening, distention, or inflammatory changes. Lymphatic: No enlarged abdominal or pelvic lymph nodes. Reproductive: Uterine fibroids. Other: Small fat containing midline epigastric hernia (series 7, image 185). Severe anasarca. Small volume perihepatic and perisplenic ascites. Musculoskeletal: No acute osseous findings. IMPRESSION: 1. Unchanged contour and caliber of the descending thoracic aorta again with a posterior intramural hematoma arising just distal to the left subclavian artery origin. Maximum caliber of the proximal descending thoracic aorta 3.8 x 3.8 cm. 2. Intramural hematoma extends into the upper abdominal aorta, to approximately the level of the celiac origin, maximum caliber of the vessel in this vicinity 3.3 x 3.1 cm. 3. Gross cardiomegaly. Unchanged small pericardial effusion. 4. Moderate left, small right pleural effusions, increased in volume compared to prior examination. 5. Severe anasarca and small volume ascites. 6.  Mild splenomegaly. 7. Uterine fibroids. Aortic Atherosclerosis (ICD10-I70.0). Electronically Signed   By: Jearld Lesch M.D.   On: 01/21/2023 14:04   ECHOCARDIOGRAM COMPLETE  Result Date: 01/21/2023    ECHOCARDIOGRAM REPORT   Patient Name:   TONYETTA LOVEGROVE Date of Exam: 01/21/2023 Medical Rec #:  [id number]        Height:       63.0 in Accession #:    [id number]       Weight:       154.3 lb Date of Birth:  04/27/1945        BSA:          1.732 m Patient Age:    77 years         BP:           97/59 mmHg Patient Gender: F                HR:           65 bpm. Exam Location:  Inpatient Procedure: 2D Echo, Cardiac Doppler and Color Doppler Indications:    Aortic dissection [facility])  History:        Patient has prior history of Echocardiogram examinations, most                 recent 09/30/2019. Stroke, Arrythmias:Atrial Fibrillation; Risk                 Factors:Hypertension.  Sonographer:    Darlys Gales Referring Phys: [phone] PAULA B SIMPSON IMPRESSIONS  1. Significant beat to beat variability due to atrial fibrillation.  2. Left ventricular ejection fraction, by estimation, is 45%. The left ventricle has mildly decreased function. The left ventricle has no regional wall motion abnormalities. There is severe concentric left ventricular hypertrophy. Consider CMR to rule out infiltrative cardiomyoathy (i.e amyloid).  3. Right ventricular systolic function is mildly reduced. The right ventricular size is mildly enlarged.  4. Left atrial size was moderately dilated.  5. Right atrial size was severely dilated.  6. A small pericardial  effusion is present. Moderate pleural effusion in the left lateral region.  7. The mitral valve is normal in structure. No evidence of mitral valve regurgitation. No evidence of mitral stenosis.  8. Tricuspid valve regurgitation is moderate.  9. The aortic valve is normal in structure. There is mild calcification of the aortic valve. There is mild thickening of the aortic valve. Aortic valve  regurgitation is not visualized. No aortic stenosis is present. 10. The inferior vena cava is dilated in size with <50% respiratory variability, suggesting right atrial pressure of 15 mmHg. There is systolic reversal of hepatic vein flow. 11. No obvious aortic dissection noted on limited views. There is some thickening around the abdominal aorta possibly due to intramural hematoma. FINDINGS  Left Ventricle: Left ventricular ejection fraction, by estimation, is 45 to 50%. The left ventricle has mildly decreased function. The left ventricle has no regional wall motion abnormalities. The left ventricular internal cavity size was normal in size. There is severe concentric left ventricular hypertrophy. Left ventricular diastolic function could not be evaluated due to atrial fibrillation. Left ventricular diastolic function could not be evaluated. Right Ventricle: The right ventricular size is mildly enlarged. No increase in right ventricular wall thickness. Right ventricular systolic function is mildly reduced. Left Atrium: Left atrial size was moderately dilated. Right Atrium: Right atrial size was severely dilated. Pericardium: A small pericardial effusion is present. Mitral Valve: The mitral valve is normal in structure. No evidence of mitral valve regurgitation. No evidence of mitral valve stenosis. Tricuspid Valve: The tricuspid valve is normal in structure. Tricuspid valve regurgitation is moderate . No evidence of tricuspid stenosis. Aortic Valve: The aortic valve is normal in structure. There is mild calcification of the aortic valve. There is mild thickening of the aortic valve. Aortic valve regurgitation is not visualized. No aortic stenosis is present. Aortic valve mean gradient measures 5.0 mmHg. Aortic valve peak gradient measures 8.0 mmHg. Aortic valve area, by VTI measures 2.17 cm. Pulmonic Valve: The pulmonic valve was normal in structure. Pulmonic valve regurgitation is not visualized. No evidence of  pulmonic stenosis. Aorta: The aortic root is normal in size and structure. Venous: The inferior vena cava is dilated in size with less than 50% respiratory variability, suggesting right atrial pressure of 15 mmHg. The inferior vena cava and the hepatic vein show a pattern of systolic flow reversal, suggestive of tricuspid regurgitation. IAS/Shunts: No atrial level shunt detected by color flow Doppler. Additional Comments: There is a moderate pleural effusion in the left lateral region.  LEFT VENTRICLE PLAX 2D LVIDd:         4.00 cm   Diastology LVIDs:         3.10 cm   LV e' medial:    8.27 cm/s LV PW:         1.40 cm   LV E/e' medial:  13.4 LV IVS:        1.50 cm   LV e' lateral:   11.30 cm/s LVOT diam:     1.80 cm   LV E/e' lateral: 9.8 LV SV:         51 LV SV Index:   29 LVOT Area:     2.54 cm  RIGHT VENTRICLE            IVC RV S prime:     9.14 cm/s  IVC diam: 3.00 cm TAPSE (M-mode): 1.2 cm LEFT ATRIUM              Index  RIGHT ATRIUM           Index LA Vol (A2C):   119.0 ml 68.71 ml/m  RA Area:     35.80 cm LA Vol (A4C):   77.2 ml  44.58 ml/m  RA Volume:   141.00 ml 81.41 ml/m LA Biplane Vol: 101.0 ml 58.32 ml/m  AORTIC VALVE AV Area (Vmax):    2.00 cm AV Area (Vmean):   2.08 cm AV Area (VTI):     2.17 cm AV Vmax:           141.00 cm/s AV Vmean:          100.000 cm/s AV VTI:            0.234 m AV Peak Grad:      8.0 mmHg AV Mean Grad:      5.0 mmHg LVOT Vmax:         111.00 cm/s LVOT Vmean:        81.600 cm/s LVOT VTI:          0.200 m LVOT/AV VTI ratio: 0.85  AORTA Ao Root diam: 2.50 cm Ao Asc diam:  3.30 cm MITRAL VALVE                TRICUSPID VALVE MV Area (PHT): 3.02 cm     TR Peak grad:   28.9 mmHg MV Decel Time: 251 msec     TR Vmax:        269.00 cm/s MV E velocity: 111.00 cm/s                             SHUNTS                             Systemic VTI:  0.20 m                             Systemic Diam: 1.80 cm Aditya Sabharwal Electronically signed by Dorthula Nettles Signature  Date/Time: 01/21/2023/1:24:08 PM    Final    CT Angio Chest/Abd/Pel for Dissection W and/or Wo Contrast  Addendum Date: 01/19/2023   ADDENDUM REPORT: 01/19/2023 16:57 ADDENDUM: On further review, the origin of intramural hemorrhage is visible, especially on the sagittal reconstructions with a focal ulceration along the superior aspect of the aortic arch located approximately 1.3 cm distal to the left subclavian origin. This likely is the origin of the intramural hemorrhage which then progressed distally in the thoracic aorta and into the proximal abdominal aorta. Electronically Signed   By: Irish Lack M.D.   On: 01/19/2023 16:57   Result Date: 01/19/2023 CLINICAL DATA:  Suggestion of possible aortic dissection/intramural hemorrhage of the aorta on CT of the abdomen and pelvis earlier today. EXAM: CT ANGIOGRAPHY CHEST, ABDOMEN AND PELVIS TECHNIQUE: Non-contrast CT of the chest was initially obtained. Multidetector CT imaging through the chest, abdomen and pelvis was performed using the standard protocol during bolus administration of intravenous contrast. Multiplanar reconstructed images and MIPs were obtained and reviewed to evaluate the vascular anatomy. RADIATION DOSE REDUCTION: This exam was performed according to the departmental dose-optimization program which includes automated exposure control, adjustment of the mA and/or kV according to patient size and/or use of iterative reconstruction technique. CONTRAST:  75mL OMNIPAQUE IOHEXOL 350 MG/ML SOLN COMPARISON:  CT of the abdomen and pelvis earlier today. FINDINGS: CTA CHEST FINDINGS Cardiovascular:  There is aneurysmal disease of the thoracic aorta beginning at the level of the distal arch and continuing into the descending thoracic aorta. Component of acute intramural hemorrhage is suspected beginning at roughly the aortic isthmus and continuing into the descending thoracic aorta. Maximum caliber of the distal arch including the estimated  component of intramural hemorrhage is 3.9 cm. Maximum caliber of the descending thoracic aorta including intramural hemorrhage is approximately 3.7 cm. There also is a component additional adjacent crescentic fluid along the left lateral aspect of the descending thoracic aorta which appears to be separate from the intramural hemorrhage. The presence of some crescentic fluid could be related to some extraluminal bleeding but is not large. No overt intimal flap is present within the opacified lumen of the thoracic aorta. The intramural hemorrhage does not appear to affect the ascending thoracic aorta or great vessel origins at this time. The heart is substantially enlarged. There is a small amount of pericardial fluid. No significant calcified coronary artery plaque. Central pulmonary arteries are dilated with the main pulmonary artery measuring up to 3.4 cm. Mediastinum/Nodes: No enlarged mediastinal, hilar, or axillary lymph nodes. Thyroid gland, trachea, and esophagus demonstrate no significant findings. Lungs/Pleura: Trace left pleural fluid. Left lower lung atelectasis. No overt pulmonary edema. No pneumothorax. Musculoskeletal: No chest wall abnormality. No acute or significant osseous findings. Review of the MIP images confirms the above findings. CTA ABDOMEN AND PELVIS FINDINGS VASCULAR Aorta: As suspected on the abdominal CT, the intramural hemorrhage of the descending thoracic aorta likely continues into the proximal abdominal aorta but likely ends before origins of the renal arteries. No intimal flap is noted in the abdominal aorta. The abdominal aorta is not aneurysmal. Celiac: Normally patent.  Normally patent branch vessels. SMA: Normally patent. Renals: Normally patent single left renal artery. There are 3 separate normally patent right renal arteries. IMA: Normally patent. Inflow: Normally patent bilateral iliac arteries without aneurysm, stenosis or dissection. Review of the MIP images confirms the  above findings. NON-VASCULAR Hepatobiliary: Stable appearance of liver and gallbladder. No biliary ductal dilatation. Pancreas: Unremarkable. No pancreatic ductal dilatation or surrounding inflammatory changes. Spleen: Stable splenic enlargement. The previously noted anterior splenic lesion is not well appreciated on the arterial phase. 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 Bladder contains excreted contrast related to the prior CT. Stomach/Bowel: No bowel obstruction, inflammation or free intraperitoneal air. Lymphatic: No enlarged lymph nodes identified. Reproductive: Enlarged uterus demonstrating degenerated and calcified fibroids. Other: Stable small ventral hernia. Body wall anasarca without significant ascites or focal abnormal fluid collection. Musculoskeletal: No acute or significant osseous findings. Review of the MIP images confirms the above findings. IMPRESSION: 1. 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 Component of acute intramural hemorrhage is suspected beginning at roughly the aortic isthmus just beyond the left subclavian artery origin and continuing into the descending thoracic aorta. Maximum caliber of the distal arch including the estimated component of intramural hemorrhage is 3.9 cm. Maximum caliber of the descending thoracic aorta including intramural hemorrhage is approximately 3.7 cm. There also is a component of additional adjacent crescentic fluid along the left lateral aspect of the descending thoracic aorta which appears to be separate from the intramural hemorrhage. The presence of some crescentic fluid could be related to some extraluminal bleeding but is not large. This could also be related to reactive fluid. No overt intimal flap is present within the opacified lumen  of the thoracic aorta. The intramural hemorrhage does not appear  to affect the ascending thoracic aorta or great vessel origins at this time. 2. As suspected on the abdominal CT, the intramural hemorrhage of the descending thoracic aorta likely continues into the proximal abdominal aorta but likely ends before origins of the renal arteries. Intramural hemorrhage does not compromise patency of the celiac axis or SMA. 3. Substantial cardiomegaly with small amount of pericardial fluid. 4. Dilated central pulmonary arteries suggesting pulmonary arterial hypertension. 5. Trace left pleural fluid with left lower lung atelectasis. 6. Stable splenic enlargement. The previously noted anterior splenic lesion is not well appreciated on the arterial phase. 7. 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 8. Enlarged uterus demonstrating degenerated and calcified fibroids. 9. Stable small ventral hernia. 10. Body wall anasarca without significant ascites or focal abnormal fluid collection. 11. These results were called by telephone at the time of interpretation on 01/19/2023 at 4:50PM to provider Dr. Fuller Plan, who verbally acknowledged these results. Electronically Signed: By: Irish Lack M.D. On: 01/19/2023 16:50   US Venous Img Lower Bilateral  Result Date: 01/19/2023 CLINICAL DATA:  77 year old female with bilateral lower extremity edema. EXAM: BILATERAL LOWER EXTREMITY VENOUS DOPPLER ULTRASOUND TECHNIQUE: Gray-scale sonography with graded compression, as well as color Doppler and duplex ultrasound were performed to evaluate the lower extremity deep venous systems from the level of the common femoral vein and including the common femoral, femoral, profunda femoral, popliteal and calf veins including the posterior tibial, peroneal and gastrocnemius veins when visible. The superficial great saphenous vein was also interrogated. Spectral Doppler was utilized to evaluate flow at rest and with distal augmentation maneuvers in the common femoral, femoral and  popliteal veins. COMPARISON:  None Available. FINDINGS: RIGHT LOWER EXTREMITY Common Femoral Vein: No evidence of thrombus. Normal compressibility, respiratory phasicity and response to augmentation. Saphenofemoral Junction: No evidence of thrombus. Normal compressibility and flow on color Doppler imaging. Profunda Femoral Vein: No evidence of thrombus. Normal compressibility and flow on color Doppler imaging. Femoral Vein: No evidence of thrombus. Normal compressibility, respiratory phasicity and response to augmentation. Popliteal Vein: No evidence of thrombus. Normal compressibility, respiratory phasicity and response to augmentation. Calf Veins: No evidence of thrombus. Normal compressibility and flow on color Doppler imaging. Other Findings:  None. LEFT LOWER EXTREMITY Common Femoral Vein: No evidence of thrombus. Normal compressibility, respiratory phasicity and response to augmentation. Saphenofemoral Junction: No evidence of thrombus. Normal compressibility and flow on color Doppler imaging. Profunda Femoral Vein: No evidence of thrombus. Normal compressibility and flow on color Doppler imaging. Femoral Vein: No evidence of thrombus. Normal compressibility, respiratory phasicity and response to augmentation. Popliteal Vein: No evidence of thrombus. Normal compressibility, respiratory phasicity and response to augmentation. Calf Veins: No evidence of thrombus. Normal compressibility and flow on color Doppler imaging. Other Findings:  None. IMPRESSION: No evidence of bilateral lower extremity deep venous thrombosis. Marliss Coots, MD [facility] Electronically Signed   By: Marliss Coots M.D.   On: 01/19/2023 14:58   CT ABDOMEN PELVIS W CONTRAST  Addendum Date: 01/19/2023   ADDENDUM REPORT: 01/19/2023 14:55 ADDENDUM: Critical Value/emergent results were called by telephone at the time of interpretation on 01/19/2023 at 2:55 pm to provider DAVID  WELLS , who verbally acknowledged these results. Electronically Signed   By: Jules Schick M.D.   On: 01/19/2023 14:55   Result Date: 01/19/2023 CLINICAL DATA:  Generalized abdominal tenderness to palpation. Prior history of hernia. Concern for obstruction  EXAM: CT ABDOMEN AND PELVIS WITH CONTRAST TECHNIQUE: Multidetector CT imaging of the abdomen and pelvis was performed using the standard protocol following bolus administration of intravenous contrast. RADIATION DOSE REDUCTION: This exam was performed according to the departmental dose-optimization program which includes automated exposure control, adjustment of the mA and/or kV according to patient size and/or use of iterative reconstruction technique. CONTRAST:  OMNIPAQUE IOHEXOL 300 MG/ML  SOLN COMPARISON:  None Available. FINDINGS: Lower chest: There is small left and trace right pleural effusion. There are associated atelectatic changes at the lung bases. No consolidation or lung mass. There is mild-to-moderate cardiomegaly. Partially seen at least mild pericardial effusion. Hepatobiliary: The liver is normal in size. Non-cirrhotic configuration. There is heterogeneous attenuation of the liver, predominantly right hepatic lobe, which may be due to heterogeneous hepatic steatosis. No discrete suspicious mass seen within the limitations of this exam. No intrahepatic or extrahepatic bile duct dilation. No calcified gallstones. Normal gallbladder wall thickness. No pericholecystic inflammatory changes. Pancreas: Unremarkable. No pancreatic ductal dilatation or surrounding inflammatory changes. Spleen: There is a well-circumscribed heterogeneous, mixed hypoattenuating and hyperattenuating 4.9 x 5.9 cm lesion in the anterior aspect of the spleen. This is incompletely characterized on the current exam. Adrenals/Urinary Tract: Adrenal glands are unremarkable. Malrotated bilateral kidneys noted. There are hypoattenuating areas in the left kidney lower pole,  reaching up to the cortical surface. The differential diagnosis includes renal infarction versus pyelonephritis. There are at least 4, subcentimeter sized nonobstructing calculi in the right kidney. No nephroureterolithiasis on the left side. No hydroureteronephrosis on either side. Note is made of bilateral extrarenal pelvis. Unremarkable urinary bladder. Stomach/Bowel: No disproportionate dilation of the small or large bowel loops. No evidence of abnormal bowel wall thickening or inflammatory changes. The appendix is unremarkable. Vascular/Lymphatic: There is small ascites no pneumoperitoneum. No abdominal or pelvic lymphadenopathy, by size criteria. No aneurysmal dilation of the major abdominal arteries. There are mild peripheral atherosclerotic vascular calcifications of the aorta and its major branches. There is crescentic filling defect along the left/posterior wall of the lower thoracic aorta extending inferiorly into the abdomen up to the level of origin of the inferior mesenteric artery. This is nonspecific and differential diagnosis includes thrombosed false lumen in aortic dissection, intramural hematoma or intraluminal thrombus/hematoma, etc. However, there is probable focal dissection flap in the proximal left renal artery (series 3, image 25), favoring aortic dissection. Further imaging of chest with dissection protocol (before and after intravenous contrast) is recommended. There is asymmetric dilated and tortuous left-sided parametrial/gonadal veins, which is nonspecific but in appropriate clinical setting can be seen with pelvic congestion syndrome. Reproductive: Enlarged bulky uterus containing multiple hyperattenuating and calcified masses, favored to represent leiomyomas. No large adnexal mass seen. Other: There is small right paramedian ventral hernia containing portion of fat and ascitic fluid. No herniation of bowel loops. The soft tissues and abdominal wall are otherwise unremarkable.  Musculoskeletal: No suspicious osseous lesions. There are mild - moderate multilevel degenerative changes in the visualized spine. IMPRESSION: 1. There is crescentic filling defect along the left/posterior wall of the lower thoracic aorta extending inferiorly into the abdomen up to the level of origin of inferior mesenteric artery. There is probable dissection flap extending into the proximal left renal artery which exhibit hypoattenuating area in the lower pole, favored to represent renal infarction. 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 Further imaging with CT angiography chest, abdomen and pelvis as per dissection protocol is recommended. 2. There is a  small right paramedian ventral hernia containing portion of fat and ascitic fluid. No herniation of bowel loops. No bowel obstruction. 3. Small left pleural effusion and partially seen pericardial effusion. 4. There is a heterogeneous approximately 4.9 x 5.9 cm lesion in the anterior spleen, which is incompletely characterized on the current exam. 5. Multiple other nonacute observations, as described above. Electronically Signed: By: Jules Schick M.D. On: 01/19/2023 14:47   DG Chest Port 1 View  Result Date: 01/19/2023 CLINICAL DATA:  Chest pain.  Weakness. EXAM: PORTABLE CHEST 1 VIEW COMPARISON:  None Available. FINDINGS: There is diffuse pulmonary vascular congestion. There are left retrocardiac opacities, which may represent combination of atelectasis and/or consolidation. Bilateral lung fields are otherwise clear. No dense consolidation or lung collapse. Apparent blunting of left lateral costophrenic angle may represent pleural effusion versus superimposed soft tissues. Right lateral costophrenic angle is clear. Moderately enlarged cardio-mediastinal silhouette. No acute osseous abnormalities. The soft tissues are within normal limits. IMPRESSION: *Findings favor congestive heart failure/pulmonary  edema. Electronically Signed   By: Jules Schick M.D.   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 Previously on aspirin and hydroxyurea, discontinued three years ago. Current platelet count critically high at 1.3 million, increasing thrombotic risk. Discussed restarting hydroxyurea to manage elevated platelet count and reduce thrombotic events. - Restart hydroxyurea 1000 mg twice a day - Follow up with Dr. Donneta Romberg for monitoring - Order VW panel to identify risk of bleeding with high Platelets count over 1.0 million. - I will be out of town the whole week of Thanksgiving.  Please reach out to Dr. Donneta Romberg or the on-call oncologist if you have any additional questions.  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 Requires close monitoring and potential surgical intervention. - Monitor aneurysm closely - Follow up with vascular surgery as needed  Congestive Heart Failure Ongoing management required to prevent exacerbations. No acute symptoms reported. - Continue current heart failure management - Follow up with cardiologist in December  Atrial Fibrillation Intermittent episodes requiring regular monitoring to manage stroke risk and complications. - Continue current atrial fibrillation management - Follow up with cardiologist in December  Left Pleural Effusion Drained by Dr. Chestine Spore. Further testing to assess protein content and other fluid characteristics. - Monitor for recurrence - Review pleural fluid analysis results  Nutritional Support Nasogastric tube in place due to lack of appetite and recent weight loss. Essential for adequate nutrition. - Continue nasogastric tube feeding as needed - Monitor nutritional status and appetite  Follow-up - Follow up with Dr. Donneta Romberg for essential thrombocythemia -  Follow up with Cardiology in December for cardiology - Consider follow-up with vascular surgery for aneurysm monitoring. The patient voices understanding of current disease status and treatment options and is in agreement with the current care plan.  All questions were answered. The patient knows to call the clinic with any problems, questions or concerns. We can certainly see the patient much sooner if necessary.  Thank you so much for allowing me to participate in the care of Julianna Coto. I will continue to follow up the patient with you and assist in her care.  Disclaimer: This note was dictated with voice recognition software. Similar sounding words can inadvertently be transcribed and may not be corrected upon review.   Lajuana Matte January 25, 2023, 1:34 PM

## 2023-01-26 DIAGNOSIS — I480 Paroxysmal atrial fibrillation: Secondary | ICD-10-CM | POA: Diagnosis not present

## 2023-01-26 DIAGNOSIS — D473 Essential (hemorrhagic) thrombocythemia: Secondary | ICD-10-CM

## 2023-01-26 DIAGNOSIS — I71012 Dissection of descending thoracic aorta: Secondary | ICD-10-CM | POA: Diagnosis not present

## 2023-01-26 DIAGNOSIS — I5020 Unspecified systolic (congestive) heart failure: Secondary | ICD-10-CM | POA: Diagnosis not present

## 2023-01-26 LAB — VON WILLEBRAND PANEL
Coagulation Factor VIII: 232 % — ABNORMAL HIGH (ref 56–140)
Ristocetin Co-factor, Plasma: 50 % (ref 50–200)
Von Willebrand Antigen, Plasma: 241 % — ABNORMAL HIGH (ref 50–200)

## 2023-01-26 LAB — CULTURE, BLOOD (ROUTINE X 2)
Culture  Setup Time: NO GROWTH
Special Requests: ADEQUATE

## 2023-01-26 LAB — BODY FLUID CULTURE W GRAM STAIN: Culture: NO GROWTH

## 2023-01-26 LAB — GLUCOSE, CAPILLARY
Glucose-Capillary: 111 mg/dL — ABNORMAL HIGH (ref 70–99)
Glucose-Capillary: 124 mg/dL — ABNORMAL HIGH (ref 70–99)
Glucose-Capillary: 136 mg/dL — ABNORMAL HIGH (ref 70–99)
Glucose-Capillary: 150 mg/dL — ABNORMAL HIGH (ref 70–99)
Glucose-Capillary: 160 mg/dL — ABNORMAL HIGH (ref 70–99)

## 2023-01-26 LAB — CERULOPLASMIN: Ceruloplasmin: 30.3 mg/dL (ref 19.0–39.0)

## 2023-01-26 LAB — COAG STUDIES INTERP REPORT

## 2023-01-26 NOTE — TOC Progression Note (Signed)
Transition of Care [facility]) - Progression Note    Patient Details  Name: Kristy Sullivan MRN: [id number] Date of Birth: 1945-10-11  [facility] CM/SW Contact  Kermit Balo, RN Phone Number: 01/26/2023, 2:30 PM  Clinical Narrative:     Pt continues with cortrak due to poor PO intake. TOC following.  Expected Discharge Plan: Home w Home Health Services Barriers to Discharge: Continued Medical Work up  Expected Discharge Plan and Services   Discharge Planning Services: CM Consult Post Acute Care Choice: Home Health Living arrangements for the past 2 months: Single Family Home                                       Social Determinants of Health (SDOH) Interventions SDOH Screenings   Food Insecurity: No Food Insecurity (01/19/2023)  Housing: Patient Declined (01/19/2023)  Transportation Needs: No Transportation Needs (01/19/2023)  Utilities: Not At Risk (01/19/2023)  Tobacco Use: Low Risk  (01/19/2023)    Readmission Risk Interventions     No data to display

## 2023-01-26 NOTE — Care Management Important Message (Signed)
Important Message  Patient Details  Name: Kristy Sullivan MRN: [id number] Date of Birth: 05/26/1945   Important Message Given:  Yes - Medicare IM     Dorena Bodo 01/26/2023, 3:34 PM

## 2023-01-26 NOTE — Progress Notes (Signed)
Palliative Care Progress Note, Assessment & Plan   Patient Name: Kristy Sullivan       Date: 01/26/2023 DOB: [birth year]  Age: 77 y.o. MRN#: [id number] Attending Physician: Willeen Niece, MD Primary Care Physician: Patient, No Pcp Per Admit Date: 01/19/2023  Subjective: Patient is out of bed and using the restroom with standby assistance from a family member during my visit.  She acknowledges my presence and is able to make her wishes known.  She has no acute complaints at this time.  HPI: 77 y.o. female with past medical history of CVA 2021, HTN, HLD, a fib, and thrombocytosis admitted on 01/19/2023 with chest pain, abdominal pain, and weakness. Diagnosed with descending aortic intramural hematoma, likely d/t uncontrolled htn. Also with leukocytosis, thrombocytosis, anemia, splenomegaly, JAK-2 positive, anasarca w/ascites, and AKI. Found to have EF down to 45% from 55%. Also found with moderate left pleural effusion. Patient with poor intake. Of note, patient lost to follow up about 2 years ago in favor of holistic approach. PMT consulted as patient is a high risk for significant complications.   Summary of counseling/coordination of care: Extensive chart review completed prior to meeting patient including labs, vital signs, imaging, progress notes, orders, and available advanced directive documents from current and previous encounters.   After reviewing the patient's chart and assessing the patient at bedside, I discussed plan of care and next steps for patient.  Family member at bedside shares that patient's son has come from [place] and stays in the evenings with her.  She says they have all discussed the patient's wishes and have outlined her medical goals.  Patient reamins in agreement with full code  and full scope.    According to the family member present at bedside today, the patient's family has come together to make sure patient has around-the-clock care.  They have gained access to her MyChart and plan on helping her keep up with her appointments as well as providing daily care for her.  She shares they have a clear understanding of her diagnosis and prognosis.  Space and opportunity provided for her and patient to ask questions.  Questions and concerns were addressed.  Patient and family advised to call with any acute palliative needs.  I shared that PMT remains available to patient and family throughout her hospitalization.  I also highlighted that we will step back and monitor her peripherally.  If we see an acute palliative needs we will intervene.  Otherwise, please reengage with PMT at patient/family's request, if patient's health deteriorates during hospitalization, or if goals change.  Physical Exam Vitals reviewed.  Constitutional:      General: She is not in acute distress.    Appearance: She is normal weight.  HENT:     Head: Normocephalic.     Mouth/Throat:     Mouth: Mucous membranes are moist.  Eyes:     Pupils: Pupils are equal, round, and reactive to light.  Abdominal:     Palpations: Abdomen is soft.  Skin:    General: Skin is dry.  Neurological:     Mental Status: She is alert and oriented to person, place, and time.  Psychiatric:  Mood and Affect: Mood normal.        Behavior: Behavior normal.        Judgment: Judgment normal.             Total Time 25 minutes   Time spent includes: Detailed review of medical records (labs, imaging, vital signs), medically appropriate exam (mental status, respiratory, cardiac, skin), discussed with treatment team, counseling and educating patient, family and staff, documenting clinical information, medication management and coordination of care.  Samara Deist L. Bonita Quin, DNP, FNP-BC Palliative Medicine Team

## 2023-01-26 NOTE — Progress Notes (Signed)
Mobility Specialist Progress Note:   01/26/23 1531  Mobility  Activity Ambulated with assistance in hallway  Level of Assistance  (MinG)  Assistive Device Front wheel walker  Distance Ambulated (ft) 250 ft  Activity Response Tolerated well  Mobility Referral Yes  $Mobility charge 1 Mobility  Mobility Specialist Start Time (ACUTE ONLY) 1410  Mobility Specialist Stop Time (ACUTE ONLY) 1420  Mobility Specialist Time Calculation (min) (ACUTE ONLY) 10 min   Pre Mobility: 82 HR  During Mobility: 103 HR , 95% SpO2 RA Post Mobility: 96 HR   Pt received in chair, agreeable to mobility. SB to stand. MinG to ambulate. Pt denied any discomfort during ambulation, asx throughout. Pt returned to chair with call bell in reach and all needs met.   Leory Plowman  Mobility Specialist Please contact via [employer] office at [phone]

## 2023-01-26 NOTE — Progress Notes (Signed)
Dr. Loney Loh text paged. Patient c/o tongue sore dry and cracked. [place] from Chloraseptic spray.Call return and wanted to let M.D. know in a.m. In mean time use mouth wash.

## 2023-01-26 NOTE — Progress Notes (Signed)
Pt not seen today. She was started on hydrea yesterday by Dr Arbutus Ped. Typically it takes several days to respond. In the mean time, she also continues on aspirin Resume Eliquis per primary team when safe. She has a FU appt on 12/6 with her primary hematologist Please call us with any further questions.

## 2023-01-26 NOTE — Progress Notes (Signed)
PROGRESS NOTE    Kristy Sullivan  DGU:[id number] DOB: Aug 15, 1945 DOA: 01/19/2023 PCP: Patient, No Pcp Per   Brief Narrative:  This 77 years old female with PMH significant for hypertension, hyperlipidemia, A-fib on Eliquis, CVA, thrombocytosis presented to the [facility] ED with complaints of chest pain, intermittent abdominal pain and generalized weakness.  Patient was hypertensive and in A-fib with RVR on arrival in the ED.  WBC 50K, BNP 597, UA unremarkable, chest x-ray finding consistent with CHF, pulmonary edema.  CTA chest showing aneurysmal disease of thoracic aorta with acute intramural hemorrhage with continuation into descending aorta likely and before the origin of renal arteries.  Lower extremity duplex negative for DVT.  Cardiothoracic surgery was consulted, vascular surgery was consulted recommended condition is nonoperative.  Patient remained admitted in ICU. TRH pickup 11/23.  Assessment & Plan:   Principal Problem:   Descending thoracic aortic dissection (HCC) Active Problems:   Pleural effusion   Hypertensive emergency   HFrEF (heart failure with reduced ejection fraction) (HCC)   PAF (paroxysmal atrial fibrillation) (HCC)   Malnutrition of moderate degree  Hypertensive emergency with acute aortic thrombus: Monitor blood pressure and heart rate control. Continue Coreg for blood pressure and rhythm control. Goal SBP <120, HR  <60.  Amlodipine is on hold. Appreciate vascular surgery consultation. Continue Lipitor 40 mg daily. BP improved.   Newly diagnosed HFrEF: Patient has significant leg edema. Hold diuretics with renal failure & poor PO intake. Unable to tolerate ARB and spironolactone due to AKI   AKI with metabolic acidosis: Most likely due to sudden drop in BP and inability to autoregulate. Lokelma given,  potassium improved. Continue bicarb supplementation Monitor renal functions, slightly improving Cautious with diuresis to hopefully ensure adequate renal  preload with her poor PO intake   Paroxysmal Afib Holding AC for about a month per VS Continue tele monitoring monitor electrolytes   Thrombocytosis, neutrophilic leukocytosis: Concerning for hematologic malignancy. JAK2+ Monitor CBC Continue aspirin daily. Oncology consulted and started patient on hydroxyurea 1000 mg twice daily.   Hyperbilirubinemia: Elevated transaminase-  Likely congestive heart failure Follow up RUQ  Moderate protein energy malnutrition: Continue cortrak feeds , start TF due to lack of adequate PO intake -ad lib PO intake   Hyperglycemia Continue SSI PRN goal BG 140-180   Prediabetes, A1c 6.1 OP follow up recommended SSI PRN   Partially loculated pleural effusion- exudative (high LDH, neutrophilic-- similar to peripheral blood, but overall low cell count is more concerning for malignancy than acute infectious etiology, although lymphocytic effusions are still the more common presentation of MPE) Follow-up flow cytometry. Cultures no growth so far. Monitor for recurrence of effusion.   DVT prophylaxis:  SCDs Code Status: Full code Family Communication: (Son at bed side. Disposition Plan:    Status is: Inpatient Remains inpatient appropriate because: Patient admitted with hypertensive emergency with acute aortic thrombus.  Vascular surgery recommended no intervention warranted at this time.  Monitor heart rate and blood pressure.  Patient underwent thoracocentesis for pleural effusion.  Follow-up labs    Consultants:  Vascular Cardiology PCCM  Procedures: Thoracocentesis  Antimicrobials:  Anti-infectives (From admission, onward)    Start     Dose/Rate Route Frequency Ordered Stop   01/21/23 1900  vancomycin (VANCOREADY) IVPB 750 mg/150 mL  Status:  Discontinued        750 mg 150 mL/hr over 60 Minutes Intravenous Every 48 hours 01/20/23 1002 01/20/23 1005   01/21/23 1900  vancomycin (VANCOREADY) IVPB 1250 mg/250 mL  Status:  Discontinued         1,250 mg 166.7 mL/hr over 90 Minutes Intravenous Every 48 hours 01/20/23 1005 01/21/23 0828   01/20/23 1400  piperacillin-tazobactam (ZOSYN) IVPB 3.375 g  Status:  Discontinued        3.375 g 12.5 mL/hr over 240 Minutes Intravenous Every 8 hours 01/20/23 1000 01/20/23 1005   01/20/23 1100  piperacillin-tazobactam (ZOSYN) IVPB 3.375 g  Status:  Discontinued        3.375 g 12.5 mL/hr over 240 Minutes Intravenous Every 8 hours 01/20/23 1005 01/21/23 1003         Subjective: Patient was seen and examined at bedside.  Overnight events noted.   Patient seems comfortable, sitting on the chair, she reports feeling better. Patient denies any chest pain or shortness of breath.  Objective: Vitals:   01/26/23 0153 01/26/23 0428 01/26/23 0825 01/26/23 1224  BP: 137/84 (!) 135/90 (!) 135/90 139/69  Pulse: (!) 107 92 98 90  Resp: 18 (!) 22 18 20   Temp: 98.3 F (36.8 C) 98.2 F (36.8 C) 98.7 F (37.1 C) 98.5 F (36.9 C)  TempSrc: Oral Oral Oral Oral  SpO2: 95% 96% 100% 96%  Weight:      Height:        Intake/Output Summary (Last 24 hours) at 01/26/2023 1511 Last data filed at 01/26/2023 0641 Gross per 24 hour  Intake 1365 ml  Output --  Net 1365 ml   Filed Weights   01/24/23 0940 01/25/23 0430 01/26/23 0131  Weight: 73.8 kg 73.9 kg 74.9 kg    Examination:  General exam: Appears calm and comfortable, deconditioned, cortrek noted. Respiratory system: Clear to auscultation. Respiratory effort normal. RR 13 Cardiovascular system: S1 & S2 heard, RRR. No JVD, murmurs, rubs, gallops or clicks. No pedal edema. Gastrointestinal system: Abdomen is nondistended, soft and nontender. Normal bowel sounds heard. Central nervous system: Alert and oriented X 3. No focal neurological deficits. Extremities: No edema, no cyanosis, no clubbing Skin: No rashes, lesions or ulcers Psychiatry: Judgement and insight appear normal. Mood & affect appropriate.     Data Reviewed: I have  personally reviewed following labs and imaging studies  CBC: Recent Labs  Lab 01/19/23 2229 01/20/23 0257 01/21/23 0233 01/22/23 0252 01/23/23 0223 01/25/23 0701 01/25/23 0802  WBC 42.0*   < > 38.5* 41.6* 31.1* 36.4* 34.9*  NEUTROABS 40.3*  --   --   --   --   --  28.3*  HGB 11.8*   < > 11.2* 11.4* 11.8* 11.7* 11.4*  HCT 37.5   < > 35.4* 35.2* 36.4 35.1* 34.9*  MCV 95.2   < > 95.7 95.9 93.8 91.4 92.1  PLT 1,184*   < > 1,045* 1,046* 884* 1,335* 1,328*   < > = values in this interval not displayed.   Basic Metabolic Panel: Recent Labs  Lab 01/20/23 0257 01/21/23 0233 01/22/23 0252 01/23/23 0223 01/23/23 1631 01/24/23 0705 01/24/23 1722 01/25/23 0701  NA 139 136 134* 134*  --  138  --  137  K 3.8 4.1 4.5 5.1  --  4.9  --  4.8  CL 107 107 107 105  --  108  --  107  CO2 23 19* 15* 14*  --  19*  --  21*  GLUCOSE 134* 108* 106* 120*  --  154*  --  152*  BUN 15 24* 34* 48*  --  51*  --  46*  CREATININE 1.50* 1.96* 2.29* 2.59*  --  2.35*  --  1.48*  CALCIUM 9.3 8.9 8.9 9.1  --  9.7  --  9.9  MG 2.0 2.3  --   --  2.5*  --  2.5* 2.5*  PHOS 4.1 4.9* 4.6 4.3 3.4 2.8 2.3* 2.2*   GFR: Estimated Creatinine Clearance: 29.9 mL/min (A) (by C-G formula based on SCr of 1.48 mg/dL (H)). Liver Function Tests: Recent Labs  Lab 01/19/23 2229 01/22/23 0252 01/22/23 1124 01/23/23 0222 01/23/23 0223 01/23/23 1631 01/24/23 0705  AST 29  --   --  50*  --  53*  --   ALT 23  --   --  35  --  43  --   ALKPHOS 94  --   --  120  --  128*  --   BILITOT 2.4*  --   --  1.6*  --  1.2*  --   PROT 5.7*  --  5.4* 6.1*  --  5.6*  --   ALBUMIN 3.2* 2.6*  --  2.8* 2.9* 2.6* 2.6*   No results for input(s): "LIPASE", "AMYLASE" in the last 168 hours. No results for input(s): "AMMONIA" in the last 168 hours. Coagulation Profile: Recent Labs  Lab 01/19/23 2229  INR 1.6*   Cardiac Enzymes: No results for input(s): "CKTOTAL", "CKMB", "CKMBINDEX", "TROPONINI" in the last 168 hours. BNP (last 3  results) No results for input(s): "PROBNP" in the last 8760 hours. HbA1C: No results for input(s): "HGBA1C" in the last 72 hours. CBG: Recent Labs  Lab 01/25/23 1642 01/25/23 2024 01/25/23 2350 01/26/23 0612 01/26/23 1231  GLUCAP 129* 156* 188* 160* 150*   Lipid Profile: No results for input(s): "CHOL", "HDL", "LDLCALC", "TRIG", "CHOLHDL", "LDLDIRECT" in the last 72 hours. Thyroid Function Tests: No results for input(s): "TSH", "T4TOTAL", "FREET4", "T3FREE", "THYROIDAB" in the last 72 hours. Anemia Panel: No results for input(s): "VITAMINB12", "FOLATE", "FERRITIN", "TIBC", "IRON", "RETICCTPCT" in the last 72 hours. Sepsis Labs: Recent Labs  Lab 01/19/23 2229 01/21/23 0822 01/23/23 1631  PROCALCITON 0.22 0.27 0.48    Recent Results (from the past 240 hour(s))  Blood Culture (routine x 2)     Status: None   Collection Time: 01/19/23  2:31 PM   Specimen: BLOOD  Result Value Ref Range Status   Specimen Description BLOOD RIGHT ANTECUBITAL  Final   Special Requests   Final    BOTTLES DRAWN AEROBIC AND ANAEROBIC Blood Culture adequate volume   Culture   Final    NO GROWTH 5 DAYS Performed at [facility], [address], [place], [place] [address]    Report Status 01/24/2023 FINAL  Final  Blood Culture (routine x 2)     Status: None   Collection Time: 01/19/23  2:36 PM   Specimen: BLOOD  Result Value Ref Range Status   Specimen Description BLOOD BLOOD RIGHT FOREARM  Final   Special Requests   Final    BOTTLES DRAWN AEROBIC AND ANAEROBIC Blood Culture adequate volume   Culture   Final    NO GROWTH 5 DAYS Performed at [facility], [address], [place], [place] [address]    Report Status 01/26/2023 FINAL  Final  MRSA Next Gen by PCR, Nasal     Status: None   Collection Time: 01/19/23  9:17 PM   Specimen: Nasal Mucosa; Nasal Swab  Result Value Ref Range Status   MRSA by PCR Next Gen NOT DETECTED NOT DETECTED Final    Comment: (NOTE) The  GeneXpert MRSA Assay (FDA approved for NASAL specimens  only), is one component of a comprehensive MRSA colonization surveillance program. It is not intended to diagnose MRSA infection nor to guide or monitor treatment for MRSA infections. Test performance is not FDA approved in patients less than 64 years old. Performed at [facility], [address], [place], [place] [address]   Body fluid culture w Gram Stain     Status: None   Collection Time: 01/22/23  5:06 PM   Specimen: Pleural Fluid  Result Value Ref Range Status   Specimen Description FLUID PLEURAL  Final   Special Requests NONE  Final   Gram Stain   Final    WBC PRESENT, PREDOMINANTLY PMN NO ORGANISMS SEEN CYTOSPIN SMEAR    Culture   Final    NO GROWTH 3 DAYS Performed at [facility], [address], [place], [place] [address]    Report Status 01/26/2023 FINAL  Final   Radiology Studies: No results found.  Scheduled Meds:  aspirin  81 mg Oral Daily   atorvastatin  40 mg Oral Daily   carvedilol  6.25 mg Oral BID WC   Chlorhexidine Gluconate Cloth  6 each Topical Daily   feeding supplement  237 mL Oral TID BM   hydroxyurea  1,000 mg Oral BID   influenza vaccine adjuvanted  0.5 mL Intramuscular Tomorrow-1000   insulin aspart  0-9 Units Subcutaneous TID WC   melatonin  3 mg Oral QHS   pneumococcal 20-valent conjugate vaccine  0.5 mL Intramuscular Tomorrow-1000   polyethylene glycol  17 g Oral BID   sodium bicarbonate  1,300 mg Oral BID   tamsulosin  0.4 mg Oral Daily   thiamine  100 mg Per Tube Daily   Continuous Infusions:  feeding supplement (VITAL AF 1.2 CAL) Stopped (01/26/23 1329)     LOS: 7 days    Time spent: 35 mins    Willeen Niece, MD [facility]   If 7PM-7AM, please contact night-coverage

## 2023-01-27 DIAGNOSIS — I71012 Dissection of descending thoracic aorta: Secondary | ICD-10-CM | POA: Diagnosis not present

## 2023-01-27 LAB — BASIC METABOLIC PANEL
Anion gap: 10 (ref 5–15)
BUN: 31 mg/dL — ABNORMAL HIGH (ref 8–23)
CO2: 25 mmol/L (ref 22–32)
Calcium: 9.9 mg/dL (ref 8.9–10.3)
Chloride: 105 mmol/L (ref 98–111)
Creatinine, Ser: 0.97 mg/dL (ref 0.44–1.00)
GFR, Estimated: 59 mL/min — ABNORMAL LOW (ref >60)
Glucose, Bld: 101 mg/dL — ABNORMAL HIGH (ref 70–99)
Potassium: 3.8 mmol/L (ref 3.5–5.1)
Sodium: 140 mmol/L (ref 135–145)

## 2023-01-27 LAB — GLUCOSE, CAPILLARY
Glucose-Capillary: 124 mg/dL — ABNORMAL HIGH (ref 70–99)
Glucose-Capillary: 140 mg/dL — ABNORMAL HIGH (ref 70–99)

## 2023-01-27 LAB — CBC
HCT: 36.9 % (ref 36.0–46.0)
Hemoglobin: 11.5 g/dL — ABNORMAL LOW (ref 12.0–15.0)
MCH: 30.3 pg (ref 26.0–34.0)
MCHC: 31.2 g/dL (ref 30.0–36.0)
MCV: 97.4 fL (ref 80.0–100.0)
Platelets: 1384 10*3/uL (ref 150–400)
RBC: 3.79 MIL/uL — ABNORMAL LOW (ref 3.87–5.11)
RDW: 16 % — ABNORMAL HIGH (ref 11.5–15.5)
WBC: 36.4 10*3/uL — ABNORMAL HIGH (ref 4.0–10.5)
nRBC: 0.2 % (ref 0.0–0.2)

## 2023-01-27 LAB — PHOSPHORUS: Phosphorus: 3.4 mg/dL (ref 2.5–4.6)

## 2023-01-27 LAB — MAGNESIUM: Magnesium: 2.1 mg/dL (ref 1.7–2.4)

## 2023-01-27 MED ORDER — CARVEDILOL 6.25 MG PO TABS: 6.2500 mg | ORAL_TABLET | Freq: Two times a day (BID) | ORAL | 10 refills | Status: DC

## 2023-01-27 MED ORDER — ASPIRIN 81 MG PO CHEW: 81.0000 mg | CHEWABLE_TABLET | Freq: Every day | ORAL | 0 refills | Status: AC

## 2023-01-27 MED ORDER — TAMSULOSIN HCL 0.4 MG PO CAPS: 0.4000 mg | ORAL_CAPSULE | Freq: Every day | ORAL | 0 refills | Status: AC

## 2023-01-27 MED ORDER — HYDROXYUREA 500 MG PO CAPS: 1000.0000 mg | ORAL_CAPSULE | Freq: Two times a day (BID) | ORAL | 0 refills | Status: DC

## 2023-01-27 MED ORDER — ATORVASTATIN CALCIUM 40 MG PO TABS: 40.0000 mg | ORAL_TABLET | Freq: Every day | ORAL | 0 refills | Status: DC

## 2023-01-27 NOTE — TOC Transition Note (Signed)
Transition of Care (TOC) - CM/SW Discharge Note Donn Pierini RN, BSN Transitions of Care Unit 4E- RN Case Manager See Treatment Team for direct phone #   Patient Details  Name: Kristy Sullivan MRN: [id number] Date of Birth: 1945-09-15  Transition of Care [facility]) CM/SW Contact:  Darrold Span, RN Phone Number: 01/27/2023, 1:09 PM   Clinical Narrative:    Pt stable for transition home today, HH and DME orders placed.   CM in to speak with pt at bedside for [facility] choice- niece also present. List provided Per CMS guidelines from [email or web address] website with star ratings (copy placed in shadow chart), after review they selected [facility] for [facility] needs, no preference for DME provider.  Address, phone # confirmed- per niece they plan to get appointment with PCP as soon as possible and will f/u with [facility] in meantime.   Call made to [facility] lia[facility]- referral has been accepted- due to Thanksgiving holiday- start of care delay till Sunday Dec 1- pt and niece updated and area agreeable.   Call made to [employer] liaison for DME needs- rollator to be delivered to room   Final next level of care: Home w Home Health Services Barriers to Discharge: Barriers Resolved   Patient Goals and CMS Choice CMS Medicare.gov Compare Post Acute Care list provided to:: Patient Choice offered to / list presented to : Patient (Niece)  Discharge Placement                 Home w/ [facility]        Discharge Plan and Services Additional resources added to the After Visit Summary for     Discharge Planning Services: CM Consult Post Acute Care Choice: Durable Medical Equipment, Home Health          DME Arranged: Walker rolling with seat DME Agency: [employer] Date DME Agency Contacted: 01/27/23 Time DME Agency Contacted: 1215 Representative spoke with at DME Agency: Zack HH Arranged: PT, OT HH Agency: [facility] Date [facility] Agency Contacted: 01/27/23 Time HH Agency Contacted: 1215 Representative  spoke with at [facility] Agency: Haywood Lasso  Social Determinants of Health (SDOH) Interventions SDOH Screenings   Food Insecurity: No Food Insecurity (01/19/2023)  Housing: Patient Declined (01/19/2023)  Transportation Needs: No Transportation Needs (01/19/2023)  Utilities: Not At Risk (01/19/2023)  Tobacco Use: Low Risk  (01/19/2023)     Readmission Risk Interventions    01/27/2023    1:09 PM  Readmission Risk Prevention Plan  Post Dischage Appt Complete  Medication Screening Complete  Transportation Screening Complete

## 2023-01-27 NOTE — Discharge Summary (Addendum)
Physician Discharge Summary  Marchell Munroe FTD:[id number] DOB: [birth year] DOA: 01/19/2023  PCP: Patient, No Pcp Per  Admit date: 01/19/2023  Discharge date: 01/27/2023  Admitted From: Home  Disposition:  Home Health Services.  Recommendations for Outpatient Follow-up:  Follow up with PCP in 1-2 weeks. Please obtain BMP/CBC in one week. Advised to follow-up with vascular surgery as scheduled. Advised to follow-up with Cardiology as scheduled. Advised to hold Eliquis for 4 weeks as per vascular surgery. Advised to take hydroxyurea 1000 mg twice daily.  Home Health:Home HPT/OT Equipment/Devices:None  Discharge Condition: Stable CODE STATUS:Full code Diet recommendation: Heart Healthy   Brief [facility] Course: This 77 years old female with PMH significant for hypertension, hyperlipidemia, A-fib on Eliquis, CVA, thrombocytosis presented to the [facility] ED with complaints of chest pain, intermittent abdominal pain and generalized weakness.  Patient was hypertensive and in A-fib with RVR on arrival in the ED.  WBC 50K, BNP 597, UA unremarkable, chest x-ray finding consistent with CHF, pulmonary edema. CTA chest showing aneurysmal disease of thoracic aorta with acute intramural hemorrhage with continuation into descending aorta likely and before the origin of renal arteries.  Lower extremity duplex negative for DVT. Cardiothoracic surgery was consulted, vascular surgery was consulted recommended condition is nonoperative.  Patient was admitted in ICU.  Patient was started on aspirin and Plavix. Vascular surgery recommended to continue DAPT for 4 weeks, start Eliquis after 4 weeks at that time discontinue Plavix.  Patient was also seen by hematologist,  Patient was started on hydroxyurea for thrombocytosis.  Patient also has pleural effusion,  underwent thoracocentesis,  cultures were negative.  Patient feels much better,  vascular surgery signed off.  Patient want to be discharged.  Patient  being discharged home.   Discharge Diagnoses:  Principal Problem:   Descending thoracic aortic dissection (HCC) Active Problems:   Pleural effusion   Hypertensive emergency   HFrEF (heart failure with reduced ejection fraction) (HCC)   PAF (paroxysmal atrial fibrillation) (HCC)   Malnutrition of moderate degree  Hypertensive emergency with acute aortic thrombus: Monitor blood pressure and heart rate control. Continue Coreg for blood pressure and rhythm control. Goal SBP <120, HR  <60.  Amlodipine is on hold. Appreciate vascular surgery consultation. Continue Lipitor 40 mg daily. BP improved.   Newly diagnosed HFrEF: Patient has significant leg edema. Hold diuretics with renal failure & poor PO intake. Unable to tolerate ARB and spironolactone due to AKI. Renal functions improved.   AKI with metabolic acidosis: Most likely due to sudden drop in BP and inability to autoregulate. Lokelma given,  potassium improved. Continue bicarb supplementation Monitor renal functions, improved and back to normal.  Paroxysmal Afib: Holding AC for about a month per VS Continue tele monitoring monitor electrolytes   Thrombocytosis, neutrophilic leukocytosis: Concerning for hematologic malignancy. JAK2+ Monitor CBC Continue aspirin daily. Oncology consulted and started patient on hydroxyurea 1000 mg twice daily.   Hyperbilirubinemia: Elevated transaminase-  Likely congestive heart failure Lfts improved.   Moderate protein energy malnutrition: Continue cortrak feeds , start TF due to lack of adequate PO intake -ad lib PO intake. Cortrek removed, tolerating regular diet.   Hyperglycemia Continue SSI PRN goal BG 140-180   Prediabetes, A1c 6.1 OP follow up recommended SSI PRN   Partially loculated pleural effusion- exudative (high LDH, neutrophilic-- similar to peripheral blood, but overall low cell count is more concerning for malignancy than acute infectious etiology, although  lymphocytic effusions are still the more common presentation of MPE) Follow-up flow cytometry. Cultures  no growth so far. Monitor for recurrence of effusion.  Discharge Instructions  Discharge Instructions     Call MD for:  difficulty breathing, headache or visual disturbances   Complete by: As directed    Call MD for:  persistant dizziness or light-headedness   Complete by: As directed    Call MD for:  persistant nausea and vomiting   Complete by: As directed    Diet - low sodium heart healthy   Complete by: As directed    Diet Carb Modified   Complete by: As directed    Discharge instructions   Complete by: As directed    Advised to follow-up with primary care physician in 1 week. Advised to follow-up with vascular surgery as scheduled. Advised to follow-up with cardiology as scheduled. Advised to hold Eliquis for 4 weeks as per vascular surgery.   Increase activity slowly   Complete by: As directed       Allergies as of 01/27/2023   No Known Allergies      Medication List     TAKE these medications    aspirin 81 MG chewable tablet Chew 1 tablet (81 mg total) by mouth daily. Start taking on: January 28, 2023   atorvastatin 40 MG tablet Commonly known as: LIPITOR Take 1 tablet (40 mg total) by mouth daily. Start taking on: January 28, 2023   carvedilol 6.25 MG tablet Commonly known as: COREG Take 1 tablet (6.25 mg total) by mouth 2 (two) times daily with a meal.   COD LIVER PO Take 1 capsule by mouth daily.   GERITOL PO Take 1 tablet by mouth daily.   hydroxyurea 500 MG capsule Commonly known as: HYDREA Take 2 capsules (1,000 mg total) by mouth 2 (two) times daily. May take with food to minimize GI side effects.   multivitamin with minerals tablet Take 1 tablet by mouth daily.   tamsulosin 0.4 MG Caps capsule Commonly known as: FLOMAX Take 1 capsule (0.4 mg total) by mouth daily. Start taking on: January 28, 2023               Durable  Medical Equipment  (From admission, onward)           Start     Ordered   01/27/23 1249  For home use only DME 4 wheeled rolling walker with seat  Once       Question:  Patient needs a walker to treat with the following condition  Answer:  Generalized weakness   01/27/23 1248   01/23/23 1714  For home use only DME 4 wheeled rolling walker with seat  Once       Question Answer Comment  Patient needs a walker to treat with the following condition Hx of TIA (transient ischemic attack) and stroke   Patient needs a walker to treat with the following condition Heart failure (HCC)      01/23/23 1714            Follow-up Information     Parke Poisson, MD Follow up in 1 week(s).   Specialties: Cardiology, Radiology Contact information: [address] [place] [address] [phone]         Victorino Sparrow, MD Follow up in 4 week(s).   Specialty: Vascular Surgery Contact information: [address] [place] [address] [phone]         Primary care. Schedule an appointment as soon as possible for a visit.   Why: make an appointment with  a primary care provider as soon as possible        [facility] Follow up.   Specialty: Home Health Services Why: Rolene Arbour)- HHPT/OT arranged- they will contact you to schedule- anticipate start of care visit for Sunday Dec 1 Contact information: [address] [place] [address] [phone]         [facility] Follow up.   Why: ([facility])- rollator arranged- to be delivered to room Contact information: [address] [place] [address] [phone]                No Known Allergies  Consultations: Vascular surgery Cardiology   Procedures/Studies: VAS US UPPER EXTREMITY VENOUS DUPLEX  Result Date: 01/25/2023 UPPER VENOUS STUDY  Patient Name:  Andreika Juday  Date of Exam:   01/24/2023 Medical Rec #: [id number]         Accession #:     [id number] Date of Birth: 11-Mar-1945         Patient Gender: F Patient Age:   47 years Exam Location:  [facility] Procedure:      VAS [place] UPPER EXTREMITY VENOUS DUPLEX Referring Phys: Koston Hennes KHATRI --------------------------------------------------------------------------------  Indications: Swelling, Edema, and Rule out DVT Comparison Study: No prior exam. Performing Technologist: Fernande Bras  Examination Guidelines: A complete evaluation includes B-mode imaging, spectral Doppler, color Doppler, and power Doppler as needed of all accessible portions of each vessel. Bilateral testing is considered an integral part of a complete examination. Limited examinations for reoccurring indications may be performed as noted.  Right Findings: +----------+------------+---------+-----------+----------+-------+ RIGHT     CompressiblePhasicitySpontaneousPropertiesSummary +----------+------------+---------+-----------+----------+-------+ IJV           Full       Yes       Yes                      +----------+------------+---------+-----------+----------+-------+ Subclavian    Full       Yes       Yes                      +----------+------------+---------+-----------+----------+-------+ Axillary      Full       Yes       Yes                      +----------+------------+---------+-----------+----------+-------+ Brachial      Full       Yes       Yes                      +----------+------------+---------+-----------+----------+-------+ Radial      Partial      Yes       Yes              Limited +----------+------------+---------+-----------+----------+-------+ Ulnar         Full       Yes       Yes                      +----------+------------+---------+-----------+----------+-------+ Cephalic      None       No        No                       +----------+------------+---------+-----------+----------+-------+ Basilic       Full       Yes  Yes                       +----------+------------+---------+-----------+----------+-------+ Innominate    Full       Yes       Yes                      +----------+------------+---------+-----------+----------+-------+ Right radial vein unable to properly compresss due to swelling in lateral arm possibly due to SVT in cephalic v. Patent by color and pulsed wave doppler.  Left Findings: +----------+------------+---------+-----------+----------+-------+ LEFT      CompressiblePhasicitySpontaneousPropertiesSummary +----------+------------+---------+-----------+----------+-------+ Subclavian               Yes       Yes                      +----------+------------+---------+-----------+----------+-------+  Summary:  Right: Findings consistent with age indeterminate superficial vein thrombosis involving the right cephalic vein.  Left: No evidence of thrombosis in the subclavian.  *See table(s) above for measurements and observations.  Diagnosing physician: Gerarda Fraction Electronically signed by Gerarda Fraction on 01/25/2023 at 12:33:36 PM.    Final    DG Abd Portable 1V  Result Date: 01/23/2023 CLINICAL DATA:  Feeding tube placement. EXAM: PORTABLE ABDOMEN - 1 VIEW COMPARISON:  None Available. FINDINGS: Feeding tube tip in the distal stomach. Normal bowel gas pattern. Unchanged cardiomegaly and left-greater-than-right pleural effusions with bibasilar atelectasis. No acute osseous abnormality. IMPRESSION: 1. Feeding tube tip in the distal stomach. Electronically Signed   By: Obie Dredge M.D.   On: 01/23/2023 11:01   US ABDOMEN LIMITED WITH LIVER DOPPLER  Result Date: 01/22/2023 CLINICAL DATA:  Ascites. EXAM: DUPLEX ULTRASOUND OF LIVER TECHNIQUE: Color and duplex Doppler ultrasound was performed to evaluate the hepatic in-flow and out-flow vessels. COMPARISON:  CTA 01/21/2023.  Contrast CT 01/19/2023. FINDINGS: Liver: Normal parenchymal echogenicity. Normal hepatic contour without nodularity. No focal  lesion, mass or intrahepatic biliary ductal dilatation. Liver appears slightly enlarged. Main Portal Vein size: 12 mm cm Portal Vein Velocities Main Prox:  24.7 cm/sec Main Mid: 32.0 cm/sec Main Dist:  20.9 cm/sec Right: 18.0 cm/sec Left: 13.0 cm/sec Hepatic Vein Velocities Right:  27.9 cm/sec Middle:  34.1 cm/sec Left:  30.1 cm/sec IVC: Preserved flow on color Doppler Hepatic Artery Velocity:  188 cm/sec Splenic Vein Velocity:  16.4 cm/sec Spleen: 13.1 cm x 6.1 cm x 6.0 cm with a total volume of 253.1 cm^3 (411 cm^3 is upper limit normal) Portal Vein Occlusion/Thrombus: No Splenic Vein Occlusion/Thrombus: No Ascites: None. Varices: None Small pleural effusions. IMPRESSION: Appropriate flow within the hepatic vasculature. Hepatosplenomegaly. Mild pleural effusions. Please correlate With multiple prior CT examinations in the last week. Electronically Signed   By: Karen Kays M.D.   On: 01/22/2023 19:44   DG Chest Port 1 View  Result Date: 01/22/2023 CLINICAL DATA:  Status post thoracentesis. EXAM: PORTABLE CHEST 1 VIEW COMPARISON:  Radiograph 01/19/2023, CT yesterday FINDINGS: No evidence of pneumothorax post thoracentesis. Left pleural effusion appears diminished with residual opacity at the left lung base. There is a small right pleural effusion and basilar opacity. Cardiomegaly is unchanged. IMPRESSION: 1. No pneumothorax post thoracentesis. 2. Decreased left pleural effusion with residual opacity at the left lung base. 3. Small right pleural effusion and basilar opacity. 4. Unchanged cardiomegaly. Electronically Signed   By: Narda Rutherford M.D.   On: 01/22/2023 18:54   CT Angio Chest/Abd/Pel for Dissection W and/or W/WO  Result Date:  01/21/2023 CLINICAL DATA:  Follow-up thoracic aortic intramural hematoma EXAM: CT ANGIOGRAPHY CHEST, ABDOMEN AND PELVIS TECHNIQUE: Non-contrast CT of the chest was initially obtained. Multidetector CT imaging through the chest, abdomen and pelvis was performed using the  standard protocol during bolus administration of intravenous contrast. Multiplanar reconstructed images and MIPs were obtained and reviewed to evaluate the vascular anatomy. RADIATION DOSE REDUCTION: This exam was performed according to the departmental dose-optimization program which includes automated exposure control, adjustment of the mA and/or kV according to patient size and/or use of iterative reconstruction technique. CONTRAST:  75mL OMNIPAQUE IOHEXOL 350 MG/ML SOLN COMPARISON:  CT chest angiogram, 01/19/2023 FINDINGS: CTA CHEST FINDINGS VASCULAR Aorta: Satisfactory opacification of the aorta. Unchanged contour and caliber of the descending thoracic aorta again with a posterior intramural hematoma arising just distal to the left subclavian artery origin. Maximum caliber of the proximal descending thoracic aorta 3.8 x 3.8 cm (series 7, image 55). Cardiovascular: No evidence of pulmonary embolism on limited non-tailored examination. Gross cardiomegaly. Unchanged small pericardial effusion. Review of the MIP images confirms the above findings. NON VASCULAR Mediastinum/Nodes: No enlarged mediastinal, hilar, or axillary lymph nodes. Thyroid gland, trachea, and esophagus demonstrate no significant findings. Lungs/Pleura: Moderate left, small right pleural effusions, increased in volume compared to prior examination. Musculoskeletal: No chest wall abnormality. No acute osseous findings. Review of the MIP images confirms the above findings. CTA ABDOMEN AND PELVIS FINDINGS VASCULAR Intramural hematoma extends into the upper abdominal aorta, to approximately the level of the celiac origin, maximum caliber of the vessel in this vicinity 3.3 x 3.1 cm (series 7, image 134). Standard branching pattern of the abdominal aorta with solitary bilateral renal arteries. Review of the MIP images confirms the above findings. NON-VASCULAR Hepatobiliary: No solid liver abnormality is seen. No gallstones, gallbladder wall thickening,  or biliary dilatation. Pancreas: Unremarkable. No pancreatic ductal dilatation or surrounding inflammatory changes. Spleen: Mild splenomegaly, maximum span 13.3 cm. Adrenals/Urinary Tract: Adrenal glands are unremarkable. Under rotated kidneys. Kidneys are otherwise normal, without renal calculi, solid lesion, or hydronephrosis. Bladder is unremarkable. Stomach/Bowel: Stomach is within normal limits. Appendix not clearly visualized. No evidence of bowel wall thickening, distention, or inflammatory changes. Lymphatic: No enlarged abdominal or pelvic lymph nodes. Reproductive: Uterine fibroids. Other: Small fat containing midline epigastric hernia (series 7, image 185). Severe anasarca. Small volume perihepatic and perisplenic ascites. Musculoskeletal: No acute osseous findings. IMPRESSION: 1. Unchanged contour and caliber of the descending thoracic aorta again with a posterior intramural hematoma arising just distal to the left subclavian artery origin. Maximum caliber of the proximal descending thoracic aorta 3.8 x 3.8 cm. 2. Intramural hematoma extends into the upper abdominal aorta, to approximately the level of the celiac origin, maximum caliber of the vessel in this vicinity 3.3 x 3.1 cm. 3. Gross cardiomegaly. Unchanged small pericardial effusion. 4. Moderate left, small right pleural effusions, increased in volume compared to prior examination. 5. Severe anasarca and small volume ascites. 6. Mild splenomegaly. 7. Uterine fibroids. Aortic Atherosclerosis (ICD10-I70.0). Electronically Signed   By: Jearld Lesch M.D.   On: 01/21/2023 14:04   ECHOCARDIOGRAM COMPLETE  Result Date: 01/21/2023    ECHOCARDIOGRAM REPORT   Patient Name:   SOMMER MELDE Date of Exam: 01/21/2023 Medical Rec #:  [id number]        Height:       63.0 in Accession #:    [id number]       Weight:       154.3 lb Date of Birth:  [birth year]  BSA:          1.732 m Patient Age:    77 years         BP:           97/59 mmHg Patient  Gender: F                HR:           65 bpm. Exam Location:  Inpatient Procedure: 2D Echo, Cardiac Doppler and Color Doppler Indications:    Aortic dissection [facility])  History:        Patient has prior history of Echocardiogram examinations, most                 recent 09/30/2019. Stroke, Arrythmias:Atrial Fibrillation; Risk                 Factors:Hypertension.  Sonographer:    Darlys Gales Referring Phys: [phone] PAULA B SIMPSON IMPRESSIONS  1. Significant beat to beat variability due to atrial fibrillation.  2. Left ventricular ejection fraction, by estimation, is 45%. The left ventricle has mildly decreased function. The left ventricle has no regional wall motion abnormalities. There is severe concentric left ventricular hypertrophy. Consider CMR to rule out infiltrative cardiomyoathy (i.e amyloid).  3. Right ventricular systolic function is mildly reduced. The right ventricular size is mildly enlarged.  4. Left atrial size was moderately dilated.  5. Right atrial size was severely dilated.  6. A small pericardial effusion is present. Moderate pleural effusion in the left lateral region.  7. The mitral valve is normal in structure. No evidence of mitral valve regurgitation. No evidence of mitral stenosis.  8. Tricuspid valve regurgitation is moderate.  9. The aortic valve is normal in structure. There is mild calcification of the aortic valve. There is mild thickening of the aortic valve. Aortic valve regurgitation is not visualized. No aortic stenosis is present. 10. The inferior vena cava is dilated in size with <50% respiratory variability, suggesting right atrial pressure of 15 mmHg. There is systolic reversal of hepatic vein flow. 11. No obvious aortic dissection noted on limited views. There is some thickening around the abdominal aorta possibly due to intramural hematoma. FINDINGS  Left Ventricle: Left ventricular ejection fraction, by estimation, is 45 to 50%. The left ventricle has mildly decreased  function. The left ventricle has no regional wall motion abnormalities. The left ventricular internal cavity size was normal in size. There is severe concentric left ventricular hypertrophy. Left ventricular diastolic function could not be evaluated due to atrial fibrillation. Left ventricular diastolic function could not be evaluated. Right Ventricle: The right ventricular size is mildly enlarged. No increase in right ventricular wall thickness. Right ventricular systolic function is mildly reduced. Left Atrium: Left atrial size was moderately dilated. Right Atrium: Right atrial size was severely dilated. Pericardium: A small pericardial effusion is present. Mitral Valve: The mitral valve is normal in structure. No evidence of mitral valve regurgitation. No evidence of mitral valve stenosis. Tricuspid Valve: The tricuspid valve is normal in structure. Tricuspid valve regurgitation is moderate . No evidence of tricuspid stenosis. Aortic Valve: The aortic valve is normal in structure. There is mild calcification of the aortic valve. There is mild thickening of the aortic valve. Aortic valve regurgitation is not visualized. No aortic stenosis is present. Aortic valve mean gradient measures 5.0 mmHg. Aortic valve peak gradient measures 8.0 mmHg. Aortic valve area, by VTI measures 2.17 cm. Pulmonic Valve: The pulmonic valve was normal in structure. Pulmonic valve regurgitation  is not visualized. No evidence of pulmonic stenosis. Aorta: The aortic root is normal in size and structure. Venous: The inferior vena cava is dilated in size with less than 50% respiratory variability, suggesting right atrial pressure of 15 mmHg. The inferior vena cava and the hepatic vein show a pattern of systolic flow reversal, suggestive of tricuspid regurgitation. IAS/Shunts: No atrial level shunt detected by color flow Doppler. Additional Comments: There is a moderate pleural effusion in the left lateral region.  LEFT VENTRICLE PLAX 2D  LVIDd:         4.00 cm   Diastology LVIDs:         3.10 cm   LV e' medial:    8.27 cm/s LV PW:         1.40 cm   LV E/e' medial:  13.4 LV IVS:        1.50 cm   LV e' lateral:   11.30 cm/s LVOT diam:     1.80 cm   LV E/e' lateral: 9.8 LV SV:         51 LV SV Index:   29 LVOT Area:     2.54 cm  RIGHT VENTRICLE            IVC RV S prime:     9.14 cm/s  IVC diam: 3.00 cm TAPSE (M-mode): 1.2 cm LEFT ATRIUM              Index        RIGHT ATRIUM           Index LA Vol (A2C):   119.0 ml 68.71 ml/m  RA Area:     35.80 cm LA Vol (A4C):   77.2 ml  44.58 ml/m  RA Volume:   141.00 ml 81.41 ml/m LA Biplane Vol: 101.0 ml 58.32 ml/m  AORTIC VALVE AV Area (Vmax):    2.00 cm AV Area (Vmean):   2.08 cm AV Area (VTI):     2.17 cm AV Vmax:           141.00 cm/s AV Vmean:          100.000 cm/s AV VTI:            0.234 m AV Peak Grad:      8.0 mmHg AV Mean Grad:      5.0 mmHg LVOT Vmax:         111.00 cm/s LVOT Vmean:        81.600 cm/s LVOT VTI:          0.200 m LVOT/AV VTI ratio: 0.85  AORTA Ao Root diam: 2.50 cm Ao Asc diam:  3.30 cm MITRAL VALVE                TRICUSPID VALVE MV Area (PHT): 3.02 cm     TR Peak grad:   28.9 mmHg MV Decel Time: 251 msec     TR Vmax:        269.00 cm/s MV E velocity: 111.00 cm/s                             SHUNTS                             Systemic VTI:  0.20 m  Systemic Diam: 1.80 cm Aditya Sabharwal Electronically signed by Dorthula Nettles Signature Date/Time: 01/21/2023/1:24:08 PM    Final    CT Angio Chest/Abd/Pel for Dissection W and/or Wo Contrast  Addendum Date: 01/19/2023   ADDENDUM REPORT: 01/19/2023 16:57 ADDENDUM: On further review, the origin of intramural hemorrhage is visible, especially on the sagittal reconstructions with a focal ulceration along the superior aspect of the aortic arch located approximately 1.3 cm distal to the left subclavian origin. This likely is the origin of the intramural hemorrhage which then progressed distally in the  thoracic aorta and into the proximal abdominal aorta. Electronically Signed   By: Irish Lack M.D.   On: 01/19/2023 16:57   Result Date: 01/19/2023 CLINICAL DATA:  Suggestion of possible aortic dissection/intramural hemorrhage of the aorta on CT of the abdomen and pelvis earlier today. EXAM: CT ANGIOGRAPHY CHEST, ABDOMEN AND PELVIS TECHNIQUE: Non-contrast CT of the chest was initially obtained. Multidetector CT imaging through the chest, abdomen and pelvis was performed using the standard protocol during bolus administration of intravenous contrast. Multiplanar reconstructed images and MIPs were obtained and reviewed to evaluate the vascular anatomy. RADIATION DOSE REDUCTION: This exam was performed according to the departmental dose-optimization program which includes automated exposure control, adjustment of the mA and/or kV according to patient size and/or use of iterative reconstruction technique. CONTRAST:  75mL OMNIPAQUE IOHEXOL 350 MG/ML SOLN COMPARISON:  CT of the abdomen and pelvis earlier today. FINDINGS: CTA CHEST FINDINGS Cardiovascular: There is aneurysmal disease of the thoracic aorta beginning at the level of the distal arch and continuing into the descending thoracic aorta. Component of acute intramural hemorrhage is suspected beginning at roughly the aortic isthmus and continuing into the descending thoracic aorta. Maximum caliber of the distal arch including the estimated component of intramural hemorrhage is 3.9 cm. Maximum caliber of the descending thoracic aorta including intramural hemorrhage is approximately 3.7 cm. There also is a component additional adjacent crescentic fluid along the left lateral aspect of the descending thoracic aorta which appears to be separate from the intramural hemorrhage. The presence of some crescentic fluid could be related to some extraluminal bleeding but is not large. No overt intimal flap is present within the opacified lumen of the thoracic aorta. The  intramural hemorrhage does not appear to affect the ascending thoracic aorta or great vessel origins at this time. The heart is substantially enlarged. There is a small amount of pericardial fluid. No significant calcified coronary artery plaque. Central pulmonary arteries are dilated with the main pulmonary artery measuring up to 3.4 cm. Mediastinum/Nodes: No enlarged mediastinal, hilar, or axillary lymph nodes. Thyroid gland, trachea, and esophagus demonstrate no significant findings. Lungs/Pleura: Trace left pleural fluid. Left lower lung atelectasis. No overt pulmonary edema. No pneumothorax. Musculoskeletal: No chest wall abnormality. No acute or significant osseous findings. Review of the MIP images confirms the above findings. CTA ABDOMEN AND PELVIS FINDINGS VASCULAR Aorta: As suspected on the abdominal CT, the intramural hemorrhage of the descending thoracic aorta likely continues into the proximal abdominal aorta but likely ends before origins of the renal arteries. No intimal flap is noted in the abdominal aorta. The abdominal aorta is not aneurysmal. Celiac: Normally patent.  Normally patent branch vessels. SMA: Normally patent. Renals: Normally patent single left renal artery. There are 3 separate normally patent right renal arteries. IMA: Normally patent. Inflow: Normally patent bilateral iliac arteries without aneurysm, stenosis or dissection. Review of the MIP images confirms the above findings. NON-VASCULAR Hepatobiliary: Stable appearance of liver and gallbladder.  No biliary ductal dilatation. Pancreas: Unremarkable. No pancreatic ductal dilatation or surrounding inflammatory changes. Spleen: Stable splenic enlargement. The previously noted anterior splenic lesion is not well appreciated on the arterial phase. 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 Bladder contains excreted contrast related to the prior CT. Stomach/Bowel: No bowel obstruction,  inflammation or free intraperitoneal air. Lymphatic: No enlarged lymph nodes identified. Reproductive: Enlarged uterus demonstrating degenerated and calcified fibroids. Other: Stable small ventral hernia. Body wall anasarca without significant ascites or focal abnormal fluid collection. Musculoskeletal: No acute or significant osseous findings. Review of the MIP images confirms the above findings. IMPRESSION: 1. 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 Component of acute intramural hemorrhage is suspected beginning at roughly the aortic isthmus just beyond the left subclavian artery origin and continuing into the descending thoracic aorta. Maximum caliber of the distal arch including the estimated component of intramural hemorrhage is 3.9 cm. Maximum caliber of the descending thoracic aorta including intramural hemorrhage is approximately 3.7 cm. There also is a component of additional adjacent crescentic fluid along the left lateral aspect of the descending thoracic aorta which appears to be separate from the intramural hemorrhage. The presence of some crescentic fluid could be related to some extraluminal bleeding but is not large. This could also be related to reactive fluid. No overt intimal flap is present within the opacified lumen of the thoracic aorta. The intramural hemorrhage does not appear to affect the ascending thoracic aorta or great vessel origins at this time. 2. As suspected on the abdominal CT, the intramural hemorrhage of the descending thoracic aorta likely continues into the proximal abdominal aorta but likely ends before origins of the renal arteries. Intramural hemorrhage does not compromise patency of the celiac axis or SMA. 3. Substantial cardiomegaly with small amount of pericardial fluid. 4. Dilated central pulmonary arteries suggesting pulmonary arterial hypertension. 5. Trace left pleural fluid  with left lower lung atelectasis. 6. Stable splenic enlargement. The previously noted anterior splenic lesion is not well appreciated on the arterial phase. 7. 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 8. Enlarged uterus demonstrating degenerated and calcified fibroids. 9. Stable small ventral hernia. 10. Body wall anasarca without significant ascites or focal abnormal fluid collection. 11. These results were called by telephone at the time of interpretation on 01/19/2023 at 4:50PM to provider Dr. Fuller Plan, who verbally acknowledged these results. Electronically Signed: By: Irish Lack M.D. On: 01/19/2023 16:50   US Venous Img Lower Bilateral  Result Date: 01/19/2023 CLINICAL DATA:  77 year old female with bilateral lower extremity edema. EXAM: BILATERAL LOWER EXTREMITY VENOUS DOPPLER ULTRASOUND TECHNIQUE: Gray-scale sonography with graded compression, as well as color Doppler and duplex ultrasound were performed to evaluate the lower extremity deep venous systems from the level of the common femoral vein and including the common femoral, femoral, profunda femoral, popliteal and calf veins including the posterior tibial, peroneal and gastrocnemius veins when visible. The superficial great saphenous vein was also interrogated. Spectral Doppler was utilized to evaluate flow at rest and with distal augmentation maneuvers in the common femoral, femoral and popliteal veins. COMPARISON:  None Available. FINDINGS: RIGHT LOWER EXTREMITY Common Femoral Vein: No evidence of thrombus. Normal compressibility, respiratory phasicity and response to augmentation. Saphenofemoral Junction: No evidence of thrombus. Normal compressibility and flow on color Doppler imaging. Profunda Femoral Vein: No evidence of thrombus. Normal compressibility and flow on color Doppler imaging. Femoral Vein: No  evidence of thrombus. Normal compressibility, respiratory phasicity and response to augmentation. Popliteal  Vein: No evidence of thrombus. Normal compressibility, respiratory phasicity and response to augmentation. Calf Veins: No evidence of thrombus. Normal compressibility and flow on color Doppler imaging. Other Findings:  None. LEFT LOWER EXTREMITY Common Femoral Vein: No evidence of thrombus. Normal compressibility, respiratory phasicity and response to augmentation. Saphenofemoral Junction: No evidence of thrombus. Normal compressibility and flow on color Doppler imaging. Profunda Femoral Vein: No evidence of thrombus. Normal compressibility and flow on color Doppler imaging. Femoral Vein: No evidence of thrombus. Normal compressibility, respiratory phasicity and response to augmentation. Popliteal Vein: No evidence of thrombus. Normal compressibility, respiratory phasicity and response to augmentation. Calf Veins: No evidence of thrombus. Normal compressibility and flow on color Doppler imaging. Other Findings:  None. IMPRESSION: No evidence of bilateral lower extremity deep venous thrombosis. Marliss Coots, MD [facility] Electronically Signed   By: Marliss Coots M.D.   On: 01/19/2023 14:58   CT ABDOMEN PELVIS W CONTRAST  Addendum Date: 01/19/2023   ADDENDUM REPORT: 01/19/2023 14:55 ADDENDUM: Critical Value/emergent results were called by telephone at the time of interpretation on 01/19/2023 at 2:55 pm to provider DAVID WELLS , who verbally acknowledged these results. Electronically Signed   By: Jules Schick M.D.   On: 01/19/2023 14:55   Result Date: 01/19/2023 CLINICAL DATA:  Generalized abdominal tenderness to palpation. Prior history of hernia. Concern for obstruction EXAM: CT ABDOMEN AND PELVIS WITH CONTRAST TECHNIQUE: Multidetector CT imaging of the abdomen and pelvis was performed using the standard protocol following bolus administration of intravenous contrast. RADIATION DOSE REDUCTION: This exam was performed according to the departmental  dose-optimization program which includes automated exposure control, adjustment of the mA and/or kV according to patient size and/or use of iterative reconstruction technique. CONTRAST:  OMNIPAQUE IOHEXOL 300 MG/ML  SOLN COMPARISON:  None Available. FINDINGS: Lower chest: There is small left and trace right pleural effusion. There are associated atelectatic changes at the lung bases. No consolidation or lung mass. There is mild-to-moderate cardiomegaly. Partially seen at least mild pericardial effusion. Hepatobiliary: The liver is normal in size. Non-cirrhotic configuration. There is heterogeneous attenuation of the liver, predominantly right hepatic lobe, which may be due to heterogeneous hepatic steatosis. No discrete suspicious mass seen within the limitations of this exam. No intrahepatic or extrahepatic bile duct dilation. No calcified gallstones. Normal gallbladder wall thickness. No pericholecystic inflammatory changes. Pancreas: Unremarkable. No pancreatic ductal dilatation or surrounding inflammatory changes. Spleen: There is a well-circumscribed heterogeneous, mixed hypoattenuating and hyperattenuating 4.9 x 5.9 cm lesion in the anterior aspect of the spleen. This is incompletely characterized on the current exam. Adrenals/Urinary Tract: Adrenal glands are unremarkable. Malrotated bilateral kidneys noted. There are hypoattenuating areas in the left kidney lower pole, reaching up to the cortical surface. The differential diagnosis includes renal infarction versus pyelonephritis. There are at least 4, subcentimeter sized nonobstructing calculi in the right kidney. No nephroureterolithiasis on the left side. No hydroureteronephrosis on either side. Note is made of bilateral extrarenal pelvis. Unremarkable urinary bladder. Stomach/Bowel: No disproportionate dilation of the small or large bowel loops. No evidence of abnormal bowel wall thickening or inflammatory changes. The appendix is unremarkable.  Vascular/Lymphatic: There is small ascites no pneumoperitoneum. No abdominal or pelvic lymphadenopathy, by size criteria. No aneurysmal dilation of the major abdominal arteries. There are mild peripheral atherosclerotic vascular calcifications of the aorta and its major branches. There is crescentic filling defect along the left/posterior wall  of the lower thoracic aorta extending inferiorly into the abdomen up to the level of origin of the inferior mesenteric artery. This is nonspecific and differential diagnosis includes thrombosed false lumen in aortic dissection, intramural hematoma or intraluminal thrombus/hematoma, etc. However, there is probable focal dissection flap in the proximal left renal artery (series 3, image 25), favoring aortic dissection. Further imaging of chest with dissection protocol (before and after intravenous contrast) is recommended. There is asymmetric dilated and tortuous left-sided parametrial/gonadal veins, which is nonspecific but in appropriate clinical setting can be seen with pelvic congestion syndrome. Reproductive: Enlarged bulky uterus containing multiple hyperattenuating and calcified masses, favored to represent leiomyomas. No large adnexal mass seen. Other: There is small right paramedian ventral hernia containing portion of fat and ascitic fluid. No herniation of bowel loops. The soft tissues and abdominal wall are otherwise unremarkable. Musculoskeletal: No suspicious osseous lesions. There are mild - moderate multilevel degenerative changes in the visualized spine. IMPRESSION: 1. There is crescentic filling defect along the left/posterior wall of the lower thoracic aorta extending inferiorly into the abdomen up to the level of origin of inferior mesenteric artery. There is probable dissection flap extending into the proximal left renal artery which exhibit hypoattenuating area in the lower pole, favored to represent renal infarction. 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 Further imaging with CT angiography chest, abdomen and pelvis as per dissection protocol is recommended. 2. There is a small right paramedian ventral hernia containing portion of fat and ascitic fluid. No herniation of bowel loops. No bowel obstruction. 3. Small left pleural effusion and partially seen pericardial effusion. 4. There is a heterogeneous approximately 4.9 x 5.9 cm lesion in the anterior spleen, which is incompletely characterized on the current exam. 5. Multiple other nonacute observations, as described above. Electronically Signed: By: Jules Schick M.D. On: 01/19/2023 14:47   DG Chest Port 1 View  Result Date: 01/19/2023 CLINICAL DATA:  Chest pain.  Weakness. EXAM: PORTABLE CHEST 1 VIEW COMPARISON:  None Available. FINDINGS: There is diffuse pulmonary vascular congestion. There are left retrocardiac opacities, which may represent combination of atelectasis and/or consolidation. Bilateral lung fields are otherwise clear. No dense consolidation or lung collapse. Apparent blunting of left lateral costophrenic angle may represent pleural effusion versus superimposed soft tissues. Right lateral costophrenic angle is clear. Moderately enlarged cardio-mediastinal silhouette. No acute osseous abnormalities. The soft tissues are within normal limits. IMPRESSION: *Findings favor congestive heart failure/pulmonary edema. Electronically Signed   By: Jules Schick M.D.   On: 01/19/2023 14:20    Subjective: Patient was seen and examined at bedside.  Overnight events noted.   Patient reports doing much better,  She wants to be discharged.  She is tolerating regular diet.  Discharge Exam: Vitals:   01/27/23 1000 01/27/23 1100  BP:    Pulse:    Resp: (!) 25 17  Temp:    SpO2:     Vitals:   01/27/23 0801 01/27/23 0900 01/27/23 1000 01/27/23 1100  BP: (!) 152/108 131/86    Pulse: 98     Resp: (!) 25 (!) 27 (!) 25 17  Temp:      TempSrc:       SpO2: 98%     Weight:      Height:        General: Pt is alert, awake, not in acute distress Cardiovascular: RRR, S1/S2 +, no rubs, no gallops Respiratory: CTA bilaterally, no wheezing, no rhonchi Abdominal: Soft, NT, ND,  bowel sounds + Extremities: no edema, no cyanosis    The results of significant diagnostics from this hospitalization (including imaging, microbiology, ancillary and laboratory) are listed below for reference.     Microbiology: Recent Results (from the past 240 hour(s))  Blood Culture (routine x 2)     Status: None   Collection Time: 01/19/23  2:31 PM   Specimen: BLOOD  Result Value Ref Range Status   Specimen Description BLOOD RIGHT ANTECUBITAL  Final   Special Requests   Final    BOTTLES DRAWN AEROBIC AND ANAEROBIC Blood Culture adequate volume   Culture   Final    NO GROWTH 5 DAYS Performed at [facility], [address] [place], [place] [address]    Report Status 01/24/2023 FINAL  Final  Blood Culture (routine x 2)     Status: None   Collection Time: 01/19/23  2:36 PM   Specimen: BLOOD  Result Value Ref Range Status   Specimen Description BLOOD BLOOD RIGHT FOREARM  Final   Special Requests   Final    BOTTLES DRAWN AEROBIC AND ANAEROBIC Blood Culture adequate volume   Culture   Final    NO GROWTH 5 DAYS Performed at [facility], [address], [place], [place] [address]    Report Status 01/26/2023 FINAL  Final  MRSA Next Gen by PCR, Nasal     Status: None   Collection Time: 01/19/23  9:17 PM   Specimen: Nasal Mucosa; Nasal Swab  Result Value Ref Range Status   MRSA by PCR Next Gen NOT DETECTED NOT DETECTED Final    Comment: (NOTE) The GeneXpert MRSA Assay (FDA approved for NASAL specimens only), is one component of a comprehensive MRSA colonization surveillance program. It is not intended to diagnose MRSA infection nor to guide or monitor treatment for MRSA infections. Test performance is not FDA approved in  patients less than 29 years old. Performed at [facility], [address], [place], [place] [address]   Body fluid culture w Gram Stain     Status: None   Collection Time: 01/22/23  5:06 PM   Specimen: Pleural Fluid  Result Value Ref Range Status   Specimen Description FLUID PLEURAL  Final   Special Requests NONE  Final   Gram Stain   Final    WBC PRESENT, PREDOMINANTLY PMN NO ORGANISMS SEEN CYTOSPIN SMEAR    Culture   Final    NO GROWTH 3 DAYS Performed at [facility], [address], [place], [place] [address]    Report Status 01/26/2023 FINAL  Final     Labs: BNP (last 3 results) Recent Labs    01/19/23 1125  BNP 597.5*   Basic Metabolic Panel: Recent Labs  Lab 01/21/23 0233 01/22/23 0252 01/23/23 0223 01/23/23 1631 01/24/23 0705 01/24/23 1722 01/25/23 0701 01/27/23 0349  NA 136 134* 134*  --  138  --  137 140  K 4.1 4.5 5.1  --  4.9  --  4.8 3.8  CL 107 107 105  --  108  --  107 105  CO2 19* 15* 14*  --  19*  --  21* 25  GLUCOSE 108* 106* 120*  --  154*  --  152* 101*  BUN 24* 34* 48*  --  51*  --  46* 31*  CREATININE 1.96* 2.29* 2.59*  --  2.35*  --  1.48* 0.97  CALCIUM 8.9 8.9 9.1  --  9.7  --  9.9  9.9  MG 2.3  --   --  2.5*  --  2.5* 2.5* 2.1  PHOS 4.9* 4.6 4.3 3.4 2.8 2.3* 2.2* 3.4   Liver Function Tests: Recent Labs  Lab 01/22/23 0252 01/22/23 1124 01/23/23 0222 01/23/23 0223 01/23/23 1631 01/24/23 0705  AST  --   --  50*  --  53*  --   ALT  --   --  35  --  43  --   ALKPHOS  --   --  120  --  128*  --   BILITOT  --   --  1.6*  --  1.2*  --   PROT  --  5.4* 6.1*  --  5.6*  --   ALBUMIN 2.6*  --  2.8* 2.9* 2.6* 2.6*   No results for input(s): "LIPASE", "AMYLASE" in the last 168 hours. No results for input(s): "AMMONIA" in the last 168 hours. CBC: Recent Labs  Lab 01/22/23 0252 01/23/23 0223 01/25/23 0701 01/25/23 0802 01/27/23 0349  WBC 41.6* 31.1* 36.4* 34.9* 36.4*  NEUTROABS  --   --   --  28.3*  --   HGB 11.4*  11.8* 11.7* 11.4* 11.5*  HCT 35.2* 36.4 35.1* 34.9* 36.9  MCV 95.9 93.8 91.4 92.1 97.4  PLT 1,046* 884* 1,335* 1,328* 1,384*   Cardiac Enzymes: No results for input(s): "CKTOTAL", "CKMB", "CKMBINDEX", "TROPONINI" in the last 168 hours. BNP: Invalid input(s): "POCBNP" CBG: Recent Labs  Lab 01/26/23 1619 01/26/23 2003 01/26/23 2351 01/27/23 0406 01/27/23 0907  GLUCAP 124* 136* 111* 124* 140*   D-Dimer No results for input(s): "DDIMER" in the last 72 hours. Hgb A1c No results for input(s): "HGBA1C" in the last 72 hours. Lipid Profile No results for input(s): "CHOL", "HDL", "LDLCALC", "TRIG", "CHOLHDL", "LDLDIRECT" in the last 72 hours. Thyroid function studies No results for input(s): "TSH", "T4TOTAL", "T3FREE", "THYROIDAB" in the last 72 hours.  Invalid input(s): "FREET3" Anemia work up No results for input(s): "VITAMINB12", "FOLATE", "FERRITIN", "TIBC", "IRON", "RETICCTPCT" in the last 72 hours. Urinalysis    Component Value Date/Time   COLORURINE YELLOW (A) 01/19/2023 1431   APPEARANCEUR CLEAR (A) 01/19/2023 1431   LABSPEC 1.031 (H) 01/19/2023 1431   PHURINE 6.0 01/19/2023 1431   GLUCOSEU NEGATIVE 01/19/2023 1431   HGBUR NEGATIVE 01/19/2023 1431   BILIRUBINUR NEGATIVE 01/19/2023 1431   KETONESUR NEGATIVE 01/19/2023 1431   PROTEINUR 30 (A) 01/19/2023 1431   NITRITE NEGATIVE 01/19/2023 1431   LEUKOCYTESUR NEGATIVE 01/19/2023 1431   Sepsis Labs Recent Labs  Lab 01/23/23 0223 01/25/23 0701 01/25/23 0802 01/27/23 0349  WBC 31.1* 36.4* 34.9* 36.4*   Microbiology Recent Results (from the past 240 hour(s))  Blood Culture (routine x 2)     Status: None   Collection Time: 01/19/23  2:31 PM   Specimen: BLOOD  Result Value Ref Range Status   Specimen Description BLOOD RIGHT ANTECUBITAL  Final   Special Requests   Final    BOTTLES DRAWN AEROBIC AND ANAEROBIC Blood Culture adequate volume   Culture   Final    NO GROWTH 5 DAYS Performed at [facility],  [address], [place], [place] [address]    Report Status 01/24/2023 FINAL  Final  Blood Culture (routine x 2)     Status: None   Collection Time: 01/19/23  2:36 PM   Specimen: BLOOD  Result Value Ref Range Status   Specimen Description BLOOD BLOOD RIGHT FOREARM  Final   Special Requests   Final  BOTTLES DRAWN AEROBIC AND ANAEROBIC Blood Culture adequate volume   Culture   Final    NO GROWTH 5 DAYS Performed at [facility], [address] [place], [place] [address]    Report Status 01/26/2023 FINAL  Final  MRSA Next Gen by PCR, Nasal     Status: None   Collection Time: 01/19/23  9:17 PM   Specimen: Nasal Mucosa; Nasal Swab  Result Value Ref Range Status   MRSA by PCR Next Gen NOT DETECTED NOT DETECTED Final    Comment: (NOTE) The GeneXpert MRSA Assay (FDA approved for NASAL specimens only), is one component of a comprehensive MRSA colonization surveillance program. It is not intended to diagnose MRSA infection nor to guide or monitor treatment for MRSA infections. Test performance is not FDA approved in patients less than 7 years old. Performed at [facility], [address] [place], [place] [address]   Body fluid culture w Gram Stain     Status: None   Collection Time: 01/22/23  5:06 PM   Specimen: Pleural Fluid  Result Value Ref Range Status   Specimen Description FLUID PLEURAL  Final   Special Requests NONE  Final   Gram Stain   Final    WBC PRESENT, PREDOMINANTLY PMN NO ORGANISMS SEEN CYTOSPIN SMEAR    Culture   Final    NO GROWTH 3 DAYS Performed at [facility], [address], [place], [place] [address]    Report Status 01/26/2023 FINAL  Final     Time coordinating discharge: Over 30 minutes  SIGNED:   Willeen Niece, MD  [facility] 01/27/2023, 4:04 PM Pager   If 7PM-7AM, please contact night-coverage

## 2023-01-27 NOTE — Progress Notes (Signed)
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  11/21 pleural effusion s/p thoracentesis. PMHx: HTN, HLD, A-Fib, CVA, thrombocytosis.    PT Comments  Pt excited and agreeable for mobility. Pt tolerated largely increased ambulation distance, supervision for STS and ambulation to keep eye on HR and BP. Pt HR 82-86 seated upon arrival elevated to 97 on standing, rapidly changing around 80s-90s in static stance. BP 151/93 (110), pt seated and rested for 3 minutes, BP 123/85 (96) HR maintained 80s before attempting ambulation. On ambulation pts HR 80-114 rapidly changing, when maintaining rate >89 initiated standing rest break until HR settled. Pt mobility improved is limited by HR and BP changes to maintain precautions. HR- 80-114 BP- 151/93 (110) prior, 123/85(96) during, 131/86 (101) post    If plan is discharge home, recommend the following: A little help with walking and/or transfers;A little help with bathing/dressing/bathroom;Help with stairs or ramp for entrance;Assistance with cooking/housework;Assist for transportation   Can travel by private Psychologist, clinical (4 wheels);BSC/3in1    Recommendations for Other Services       Precautions / Restrictions Precautions Precautions: Fall Precaution Comments: aggressive BP control SBP <120, HR <80. Restrictions Weight Bearing Restrictions: No     Mobility  Bed Mobility               General bed mobility comments: in chair begining of session    Transfers Overall transfer level: Needs assistance Equipment used: None Transfers: Sit to/from  Stand Sit to Stand: Supervision           General transfer comment: pt STS x2 from recliner no assist, done without RW static stance prior to being given RW    Ambulation/Gait Ambulation/Gait assistance: Supervision Gait Distance (Feet): 250 Feet Assistive device: Rolling walker (2 wheels) Gait Pattern/deviations: Step-through pattern, Decreased step length - right, Decreased step length - left   Gait velocity interpretation: 1.31 - 2.62 ft/sec, indicative of limited community ambulator   General Gait Details: increased speed and stability with ambulation, pt reports feeling close to normal gaot pattern, still slower than baseline.   Stairs             Wheelchair Mobility     Tilt Bed    Modified Rankin (Stroke Patients Only)       Balance Overall balance assessment: Needs assistance Sitting-balance support: Feet supported, Feet unsupported, No upper extremity supported, Bilateral upper extremity supported Sitting balance-Leahy Scale: Good     Standing balance support: No upper extremity supported, Bilateral upper extremity supported Standing balance-Leahy Scale: Fair Standing balance comment: static stance no RW for for BP no LOB, use of RW dynamic activity                            Cognition Arousal: Alert Behavior During Therapy: WFL for tasks assessed/performed Overall Cognitive Status: Impaired/Different from baseline Area of Impairment: Attention, Following commands                   Current Attention Level: Selective   Following Commands: Follows one step commands consistently, Follows one step commands with increased time  Exercises      General Comments        Pertinent Vitals/Pain Pain Assessment Pain Assessment: No/denies pain    Home Living                          Prior Function            PT Goals (current goals can now be found in the care plan section) Progress  towards PT goals: Progressing toward goals    Frequency    Min 1X/week      PT Plan      Co-evaluation              AM-PAC PT "6 Clicks" Mobility   Outcome Measure  Help needed turning from your back to your side while in a flat bed without using bedrails?: A Little Help needed moving from lying on your back to sitting on the side of a flat bed without using bedrails?: A Little Help needed moving to and from a bed to a chair (including a wheelchair)?: A Little Help needed standing up from a chair using your arms (e.g., wheelchair or bedside chair)?: A Little Help needed to walk in hospital room?: A Little Help needed climbing 3-5 steps with a railing? : A Lot 6 Click Score: 17    End of Session Equipment Utilized During Treatment: Gait belt Activity Tolerance: Patient tolerated treatment well Patient left: with call bell/phone within reach;with family/visitor present;in chair Nurse Communication: Mobility status PT Visit Diagnosis: Other abnormalities of gait and mobility (R26.89);Muscle weakness (generalized) (M62.81)     Time: 1610-9604 PT Time Calculation (min) (ACUTE ONLY): 26 min  Charges:    $Gait Training: 8-22 mins $Therapeutic Activity: 8-22 mins PT General Charges $$ ACUTE PT VISIT: 1 Visit                     Andrey Farmer. SPT Secure chat preferred    Darlin Drop 01/27/2023, 11:14 AM

## 2023-01-27 NOTE — Plan of Care (Signed)
  Problem: Education: Goal: Knowledge of General Education information will improve Description: Including pain rating scale, medication(s)/side effects and non-pharmacologic comfort measures Outcome: Adequate for Discharge   Problem: Health Behavior/Discharge Planning: Goal: Ability to manage health-related needs will improve Outcome: Adequate for Discharge   Problem: Clinical Measurements: Goal: Ability to maintain clinical measurements within normal limits will improve Outcome: Adequate for Discharge Goal: Will remain free from infection Outcome: Adequate for Discharge Goal: Diagnostic test results will improve Outcome: Adequate for Discharge Goal: Cardiovascular complication will be avoided Outcome: Adequate for Discharge   Problem: Activity: Goal: Risk for activity intolerance will decrease Outcome: Adequate for Discharge   Problem: Nutrition: Goal: Adequate nutrition will be maintained Outcome: Adequate for Discharge   Problem: Coping: Goal: Level of anxiety will decrease Outcome: Adequate for Discharge   Problem: Elimination: Goal: Will not experience complications related to bowel motility Outcome: Adequate for Discharge Goal: Will not experience complications related to urinary retention Outcome: Adequate for Discharge   Problem: Safety: Goal: Ability to remain free from injury will improve Outcome: Adequate for Discharge   Problem: Skin Integrity: Goal: Risk for impaired skin integrity will decrease Outcome: Adequate for Discharge   Problem: Education: Goal: Ability to describe self-care measures that may prevent or decrease complications (Diabetes Survival Skills Education) will improve Outcome: Adequate for Discharge Goal: Individualized Educational Video(s) Outcome: Adequate for Discharge   Problem: Coping: Goal: Ability to adjust to condition or change in health will improve Outcome: Adequate for Discharge   Problem: Fluid Volume: Goal: Ability to  maintain a balanced intake and output will improve Outcome: Adequate for Discharge   Problem: Health Behavior/Discharge Planning: Goal: Ability to identify and utilize available resources and services will improve Outcome: Adequate for Discharge Goal: Ability to manage health-related needs will improve Outcome: Adequate for Discharge   Problem: Metabolic: Goal: Ability to maintain appropriate glucose levels will improve Outcome: Adequate for Discharge   Problem: Nutritional: Goal: Maintenance of adequate nutrition will improve Outcome: Adequate for Discharge Goal: Progress toward achieving an optimal weight will improve Outcome: Adequate for Discharge   Problem: Skin Integrity: Goal: Risk for impaired skin integrity will decrease Outcome: Adequate for Discharge   Problem: Tissue Perfusion: Goal: Adequacy of tissue perfusion will improve Outcome: Adequate for Discharge

## 2023-01-27 NOTE — Progress Notes (Signed)
Nutrition Follow-up  DOCUMENTATION CODES:   Non-severe (moderate) malnutrition in context of chronic illness  INTERVENTION:  Continue regular diet as ordered Ensure Enlive po BID, each supplement provides 350 kcal and 20 grams of protein. "High Calorie, High Protein Nutrition Therapy" handout added to AVS  NUTRITION DIAGNOSIS:   Moderate Malnutrition related to chronic illness as evidenced by moderate fat depletion, severe muscle depletion, moderate muscle depletion, mild fat depletion. - remains applicable  GOAL:   Patient will meet greater than or equal to 90% of their needs - progressing  MONITOR:   PO intake, TF tolerance, Labs, Weight trends  REASON FOR ASSESSMENT:   Consult Assessment of nutrition requirement/status  ASSESSMENT:   77 yo female admitted with chest pain, intermittent abdominal pain and generalized weakness with HTN emergency, descending aortic intramural hematoma, AKI, leukocytosis and thrmbocytosis with mild anemia, splenomegaly PMH includes HTN, HLD, CVA, systolic HF, afib  PMT following. Remains full code and full scope. Plans for d/c today.    TF stopped yesterday and Cortrak removed. Pt reports tolerating diet without nausea or emesis. Continues to have regular BM's. Pt states that she ate some chicken noodle soup and potato soup yesterday. This morning she reports eating grits, potatoes, bacon and juice with about 50% of an ensure consumed.   Recalls that she loves the variety of vegetables she is able to get fresh from her garden. She plans to continue with protein shakes at home including blended shakes as well as Ensure. She prefers smaller, more frequent PO intake. Provided examples using pt's food preferences to optimize PO intake.    Meal completions: 11/23: 40% lunch, 20% dinner 11/25: 100% lunch, 30% dinner  Admit weight: 68.7 kg Current weight: 73.9 kg  + edema: non-pitting BUE, mild pitting BLE  Medications: SSI 0-9 units TID,  melatonin, miralax BID (not given), sodium bicarb BID, thiamine  Labs: BUN 31, GFR 59, platelets 1384, CBG's 111-140 x24 hours  CRP: 11.7 (H) - resulted 11/22 Zinc pending  Diet Order:   Diet Order             Diet - low sodium heart healthy           Diet Carb Modified           Diet regular Room service appropriate? Yes; Fluid consistency: Thin  Diet effective now                   EDUCATION NEEDS:   Education needs have been addressed  Skin:  Skin Assessment: Reviewed RN Assessment  Last BM:  11/25 (type 6 )  Height:   Ht Readings from Last 1 Encounters:  01/19/23 5\' 3"  (1.6 m)    Weight:   Wt Readings from Last 1 Encounters:  01/27/23 73.9 kg   BMI:  Body mass index is 28.86 kg/m.  Estimated Nutritional Needs:   Kcal:  1550-1750 kcals  Protein:  75-85 g  Fluid:  >/= 1.5 L  Kristy Sullivan, RDN, LDN Clinical Nutrition

## 2023-01-27 NOTE — Discharge Instructions (Signed)

## 2023-01-27 NOTE — Plan of Care (Signed)
  Problem: Education: Goal: Knowledge of General Education information will improve Description: Including pain rating scale, medication(s)/side effects and non-pharmacologic comfort measures Outcome: Adequate for Discharge   Problem: Health Behavior/Discharge Planning: Goal: Ability to manage health-related needs will improve Outcome: Adequate for Discharge   Problem: Clinical Measurements: Goal: Ability to maintain clinical measurements within normal limits will improve Outcome: Adequate for Discharge Goal: Will remain free from infection Outcome: Adequate for Discharge Goal: Diagnostic test results will improve Outcome: Adequate for Discharge Goal: Cardiovascular complication will be avoided Outcome: Adequate for Discharge   Problem: Activity: Goal: Risk for activity intolerance will decrease Outcome: Adequate for Discharge   Problem: Nutrition: Goal: Adequate nutrition will be maintained Outcome: Adequate for Discharge   Problem: Coping: Goal: Level of anxiety will decrease Outcome: Adequate for Discharge   Problem: Elimination: Goal: Will not experience complications related to bowel motility Outcome: Adequate for Discharge Goal: Will not experience complications related to urinary retention Outcome: Adequate for Discharge   Problem: Safety: Goal: Ability to remain free from injury will improve Outcome: Adequate for Discharge   Problem: Skin Integrity: Goal: Risk for impaired skin integrity will decrease Outcome: Adequate for Discharge   Problem: Education: Goal: Ability to describe self-care measures that may prevent or decrease complications (Diabetes Survival Skills Education) will improve Outcome: Adequate for Discharge Goal: Individualized Educational Video(s) Outcome: Adequate for Discharge   Problem: Coping: Goal: Ability to adjust to condition or change in health will improve Outcome: Adequate for Discharge   Problem: Fluid Volume: Goal: Ability to  maintain a balanced intake and output will improve Outcome: Adequate for Discharge   Problem: Health Behavior/Discharge Planning: Goal: Ability to identify and utilize available resources and services will improve Outcome: Adequate for Discharge Goal: Ability to manage health-related needs will improve Outcome: Adequate for Discharge   Problem: Metabolic: Goal: Ability to maintain appropriate glucose levels will improve Outcome: Adequate for Discharge   Problem: Nutritional: Goal: Maintenance of adequate nutrition will improve Outcome: Adequate for Discharge Goal: Progress toward achieving an optimal weight will improve Outcome: Adequate for Discharge   Problem: Skin Integrity: Goal: Risk for impaired skin integrity will decrease Outcome: Adequate for Discharge   Problem: Tissue Perfusion: Goal: Adequacy of tissue perfusion will improve Outcome: Adequate for Discharge   Problem: Acute Rehab PT Goals(only PT should resolve) Goal: Pt Will Go Supine/Side To Sit Outcome: Adequate for Discharge Goal: Patient Will Transfer Sit To/From Stand Outcome: Adequate for Discharge Goal: Pt Will Transfer Bed To Chair/Chair To Bed Outcome: Adequate for Discharge Goal: Pt Will Perform Standing Balance Or Pre-Gait Outcome: Adequate for Discharge Goal: Pt Will Ambulate Outcome: Adequate for Discharge

## 2023-02-06 ENCOUNTER — Inpatient Hospital Stay: Payer: Medicare PPO | Admitting: Internal Medicine

## 2023-02-06 ENCOUNTER — Inpatient Hospital Stay: Payer: Medicare PPO

## 2023-02-16 ENCOUNTER — Encounter: Payer: Self-pay | Admitting: Internal Medicine

## 2023-02-16 ENCOUNTER — Telehealth (HOSPITAL_COMMUNITY): Payer: Self-pay | Admitting: *Deleted

## 2023-02-16 ENCOUNTER — Inpatient Hospital Stay: Payer: Medicare PPO | Attending: Internal Medicine

## 2023-02-16 ENCOUNTER — Inpatient Hospital Stay (HOSPITAL_BASED_OUTPATIENT_CLINIC_OR_DEPARTMENT_OTHER): Payer: Medicare PPO | Admitting: Internal Medicine

## 2023-02-16 VITALS — BP 146/85 | HR 65 | Temp 98.2°F | Ht 63.0 in | Wt 147.8 lb

## 2023-02-16 DIAGNOSIS — Z79899 Other long term (current) drug therapy: Secondary | ICD-10-CM | POA: Diagnosis not present

## 2023-02-16 DIAGNOSIS — I4891 Unspecified atrial fibrillation: Secondary | ICD-10-CM | POA: Diagnosis not present

## 2023-02-16 DIAGNOSIS — I10 Essential (primary) hypertension: Secondary | ICD-10-CM | POA: Diagnosis not present

## 2023-02-16 DIAGNOSIS — Z7982 Long term (current) use of aspirin: Secondary | ICD-10-CM | POA: Diagnosis not present

## 2023-02-16 DIAGNOSIS — M25511 Pain in right shoulder: Secondary | ICD-10-CM | POA: Insufficient documentation

## 2023-02-16 DIAGNOSIS — D473 Essential (hemorrhagic) thrombocythemia: Secondary | ICD-10-CM | POA: Diagnosis not present

## 2023-02-16 LAB — CBC WITH DIFFERENTIAL (CANCER CENTER ONLY)
Abs Immature Granulocytes: 0.05 10*3/uL (ref 0.00–0.07)
Basophils Absolute: 0 10*3/uL (ref 0.0–0.1)
Basophils Relative: 1 %
Eosinophils Absolute: 0.1 10*3/uL (ref 0.0–0.5)
Eosinophils Relative: 1 %
HCT: 34.1 % — ABNORMAL LOW (ref 36.0–46.0)
Hemoglobin: 10.9 g/dL — ABNORMAL LOW (ref 12.0–15.0)
Immature Granulocytes: 1 %
Lymphocytes Relative: 11 %
Lymphs Abs: 1 10*3/uL (ref 0.7–4.0)
MCH: 31.3 pg (ref 26.0–34.0)
MCHC: 32 g/dL (ref 30.0–36.0)
MCV: 98 fL (ref 80.0–100.0)
Monocytes Absolute: 0.4 10*3/uL (ref 0.1–1.0)
Monocytes Relative: 5 %
Neutro Abs: 6.8 10*3/uL (ref 1.7–7.7)
Neutrophils Relative %: 81 %
Platelet Count: 1048 10*3/uL (ref 150–400)
RBC: 3.48 MIL/uL — ABNORMAL LOW (ref 3.87–5.11)
RDW: 19 % — ABNORMAL HIGH (ref 11.5–15.5)
Smear Review: INCREASED
WBC Count: 8.3 10*3/uL (ref 4.0–10.5)
nRBC: 0 % (ref 0.0–0.2)

## 2023-02-16 LAB — CMP (CANCER CENTER ONLY)
ALT: 28 U/L (ref 0–44)
AST: 22 U/L (ref 15–41)
Albumin: 3.4 g/dL — ABNORMAL LOW (ref 3.5–5.0)
Alkaline Phosphatase: 232 U/L — ABNORMAL HIGH (ref 38–126)
Anion gap: 9 (ref 5–15)
BUN: 23 mg/dL (ref 8–23)
CO2: 28 mmol/L (ref 22–32)
Calcium: 9.6 mg/dL (ref 8.9–10.3)
Chloride: 103 mmol/L (ref 98–111)
Creatinine: 0.77 mg/dL (ref 0.44–1.00)
GFR, Estimated: >60 mL/min (ref >60)
Glucose, Bld: 127 mg/dL — ABNORMAL HIGH (ref 70–99)
Potassium: 4.3 mmol/L (ref 3.5–5.1)
Sodium: 140 mmol/L (ref 135–145)
Total Bilirubin: 1 mg/dL (ref <1.2)
Total Protein: 6.6 g/dL (ref 6.5–8.1)

## 2023-02-16 LAB — IRON AND TIBC
Iron: 37 ug/dL (ref 28–170)
Saturation Ratios: 12 % (ref 10.4–31.8)
TIBC: 298 ug/dL (ref 250–450)
UIBC: 261 ug/dL

## 2023-02-16 LAB — FERRITIN: Ferritin: 166 ng/mL (ref 11–307)

## 2023-02-16 LAB — PATHOLOGIST SMEAR REVIEW

## 2023-02-16 MED ORDER — HYDROXYUREA 500 MG PO CAPS: 1000.0000 mg | ORAL_CAPSULE | Freq: Two times a day (BID) | ORAL | 4 refills | Status: DC

## 2023-02-16 NOTE — Progress Notes (Signed)
Bruising: no Bleeding from gums: no  D/C from [facility] 01/27/23 for Descending thoracic aortic dissection.  C/o right shoulder blade pain that comes and goes, no injury, no pain today. PT comes out to her house, they advise tylenol.  Appetite 50% normal, 1 protein drink per day.  Pt states she had some bleeding in the left nostril, stopped 3 days ago. She states a blood clot came out and no bleeding since.

## 2023-02-16 NOTE — Progress Notes (Signed)
Critical lab received by [facility] in Lab; platelets 1,048 today. Read back. Dr B notified.

## 2023-02-16 NOTE — Addendum Note (Signed)
Addended by: Darrold Span A on: 02/16/2023 04:06 PM   Modules accepted: Orders

## 2023-02-16 NOTE — Progress Notes (Signed)
[facility] CONSULT NOTE  Patient Care Team: Patient, No Pcp Per as PCP - General (General Practice) Iran Ouch, MD as PCP - Cardiology (Cardiology) Earna Coder, MD as Consulting Physician (Internal Medicine)  CHIEF COMPLAINTS/PURPOSE OF CONSULTATION:  Essential thrombocytosis  #  Oncology History Overview Note   # July 2021-HIGH RISK Essential thrombocytosis-JAK-2 positive- / A.fib- TIA- currently on hydrea ; AUG 9th 1000 mg BID.   #A. fib with RVR-July 2021; August 9-Eliquis/baby aspirin   Essential thrombocythemia (HCC)  09/30/2019 Initial Diagnosis   Essential thrombocythemia (HCC)      HISTORY OF PRESENTING ILLNESS: Patient ambulating-with assistance/rolling walker. Accompanied by husband.   Kristy Sullivan 77 y.o.  female  On A. Fib/Essential Thrombocytosis; poorly controlled hypertension, hyperlipidemia, A-fib on Eliquis, CVA-was recently admitted to the hospital at [facility] aortic dissection.  Patient last seen in the clinic in 2021.  Patient decided to take herself off Hydrea/lost to follow-up.   Patient's platelets were in the order of 1.3 million.  On admission patient was not on Hydrea.  Patient was taken off Eliquis.  At the time of discharge patient was sent home on Hydrea.   Denies any blood in stools or black or stools.  Denies any worsening weakness.  No nausea no vomiting.  No diarrhea.  No skin rash.  Review of Systems  Constitutional:  Negative for chills, diaphoresis, fever, malaise/fatigue and weight loss.  HENT:  Negative for nosebleeds and sore throat.   Eyes:  Negative for double vision.  Respiratory:  Negative for cough, hemoptysis, sputum production, shortness of breath and wheezing.   Cardiovascular:  Negative for chest pain, palpitations, orthopnea and leg swelling.  Gastrointestinal:  Negative for abdominal pain, blood in stool, constipation, diarrhea, heartburn, melena, nausea and vomiting.  Genitourinary:   Negative for dysuria, frequency and urgency.  Musculoskeletal:  Positive for back pain and joint pain.  Skin: Negative.  Negative for itching and rash.  Neurological:  Negative for dizziness, tingling, focal weakness, weakness and headaches.  Endo/Heme/Allergies:  Does not bruise/bleed easily.  Psychiatric/Behavioral:  Negative for depression. The patient is not nervous/anxious and does not have insomnia.      MEDICAL HISTORY:  Past Medical History:  Diagnosis Date   Embolic stroke (HCC)    a. 09/2019 MRI: several punctate acute infarcts in R parietal lobe affecting the cortical and subcortical brain consistent w/ micro embolic infarcts in R MCA branch vessel distribution.   Hyperlipidemia    Hypertension    Persistent atrial fibrillation (HCC)    a. 09/2019 Dx in setting of ED visit for weakness-->embolic stroke; b. CHA2DS2VASc = 6-->eliquis; c. 09/2019 Echo: EF 50-55%, no rwma, mild LVH. Nl RV size/fxn. Mild LAE, mod RAE. Mild to mod MR.   Thrombocythemia, essential (HCC)    JAK2 positive    SURGICAL HISTORY: History reviewed. No pertinent surgical history.  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    Social Drivers of Corporate investment banker Strain: Not on file  Food Insecurity: No Food Insecurity (01/19/2023)   Hunger Vital Sign    Worried About Running Out of Food in the Last Year: Never true    Ran Out of Food in the  Last Year: Never true  Transportation Needs: No Transportation Needs (01/19/2023)   PRAPARE - Administrator, Civil Service (Medical): No    Lack of Transportation (Non-Medical): No  Physical Activity: Not on file  Stress: Not on file  Social Connections: Not on file  Intimate Partner Violence: Not At Risk (01/19/2023)   Humiliation, Afraid, Rape, and Kick questionnaire    Fear of Current or Ex-Partner: No    Emotionally Abused: No    Physically Abused: No    Sexually Abused: No    FAMILY HISTORY: Family History  Problem Relation Age of Onset   High blood pressure Mother    Hypertension Brother     ALLERGIES:  has no known allergies.  MEDICATIONS:  Current Outpatient Medications  Medication Sig Dispense Refill   aspirin 81 MG chewable tablet Chew 1 tablet (81 mg total) by mouth daily. 30 tablet 0   atorvastatin (LIPITOR) 40 MG tablet Take 1 tablet (40 mg total) by mouth daily. 30 tablet 0   carvedilol (COREG) 6.25 MG tablet Take 1 tablet (6.25 mg total) by mouth 2 (two) times daily with a meal. 60 tablet 10   Cod Liver Oil (COD LIVER PO) Take 1 capsule by mouth daily.     Iron-Vitamins (GERITOL PO) Take 1 tablet by mouth daily.     Multiple Vitamins-Minerals (MULTIVITAMIN WITH MINERALS) tablet Take 1 tablet by mouth daily.     tamsulosin (FLOMAX) 0.4 MG CAPS capsule Take 1 capsule (0.4 mg total) by mouth daily. 30 capsule 0   hydroxyurea (HYDREA) 500 MG capsule Take 2 capsules (1,000 mg total) by mouth 2 (two) times daily. May take with food to minimize GI side effects. 120 capsule 4   No current facility-administered medications for this visit.      Marland Kitchen  PHYSICAL EXAMINATION: ECOG PERFORMANCE STATUS: 1 - Symptomatic but completely ambulatory  Vitals:   02/16/23 1341 02/16/23 1406  BP: (!) 143/102 (!) 146/85  Pulse:    Temp:    SpO2:      Filed Weights   02/16/23 1324  Weight: 147 lb 12.8 oz (67 kg)     Physical Exam Constitutional:       Comments: Ambulating independently.  Accompanied by her niece.  HENT:     Head: Normocephalic and atraumatic.     Mouth/Throat:     Pharynx: No oropharyngeal exudate.  Eyes:     Pupils: Pupils are equal, round, and reactive to light.  Cardiovascular:     Rate and Rhythm: Normal rate. Rhythm irregular.  Pulmonary:     Effort: No respiratory distress.     Breath sounds: No wheezing.  Abdominal:     General: Bowel sounds are normal. There is no distension.     Palpations: Abdomen is soft. There is no mass.     Tenderness: There is no abdominal tenderness. There is no guarding or rebound.  Musculoskeletal:        General: No tenderness. Normal range of motion.     Cervical back: Normal range of motion and neck supple.  Skin:    General:  Skin is warm.  Neurological:     General: No focal deficit present.     Mental Status: She is alert and oriented to person, place, and time.  Psychiatric:        Mood and Affect: Affect normal.      LABORATORY DATA:  I have reviewed the data as listed Lab Results  Component Value Date   WBC 8.3 02/16/2023   HGB 10.9 (L) 02/16/2023   HCT 34.1 (L) 02/16/2023   MCV 98.0 02/16/2023   PLT 1,048 (HH) 02/16/2023   Recent Labs    01/23/23 0222 01/23/23 0223 01/23/23 1631 01/24/23 0705 01/25/23 0701 01/27/23 0349 02/16/23 1315  NA  --    < >  --  138 137 140 140  K  --    < >  --  4.9 4.8 3.8 4.3  CL  --    < >  --  108 107 105 103  CO2  --    < >  --  19* 21* 25 28  GLUCOSE  --    < >  --  154* 152* 101* 127*  BUN  --    < >  --  51* 46* 31* 23  CREATININE  --    < >  --  2.35* 1.48* 0.97 0.77  CALCIUM  --    < >  --  9.7 9.9 9.9 9.6  GFRNONAA  --    < >  --  21* 36* 59* >60  PROT 6.1*  --  5.6*  --   --   --  6.6  ALBUMIN 2.8*   < > 2.6* 2.6*  --   --  3.4*  AST 50*  --  53*  --   --   --  22  ALT 35  --  43  --   --   --  28  ALKPHOS 120  --  128*  --   --   --  232*  BILITOT 1.6*  --  1.2*  --   --   --  1.0  BILIDIR 0.5*  --   0.3*  --   --   --   --   IBILI 1.1*  --  0.9  --   --   --   --    < > = values in this interval not displayed.    RADIOGRAPHIC STUDIES: I have personally reviewed the radiological images as listed and agreed with the findings in the report. VAS [place] UPPER EXTREMITY VENOUS DUPLEX Result Date: 01/25/2023 UPPER VENOUS STUDY  Patient Name:  SHAVELLE TEP  Date of Exam:   01/24/2023 Medical Rec #: [id number]         Accession #:    [id number] Date of Birth: [birth year]         Patient Gender: F Patient Age:   18 years Exam Location:  [facility] Procedure:      VAS [place] UPPER EXTREMITY VENOUS DUPLEX Referring Phys: PARDEEP KHATRI --------------------------------------------------------------------------------  Indications: Swelling, Edema, and Rule out DVT Comparison Study: No prior exam. Performing Technologist: Fernande Bras  Examination Guidelines: A complete evaluation includes B-mode imaging, spectral Doppler, color Doppler, and power Doppler as needed of all accessible portions of each vessel. Bilateral testing is considered an integral part of a complete examination. Limited examinations for reoccurring indications may be performed as noted.  Right Findings: +----------+------------+---------+-----------+----------+-------+ RIGHT     CompressiblePhasicitySpontaneousPropertiesSummary +----------+------------+---------+-----------+----------+-------+ IJV  Full       Yes       Yes                      +----------+------------+---------+-----------+----------+-------+ Subclavian    Full       Yes       Yes                      +----------+------------+---------+-----------+----------+-------+ Axillary      Full       Yes       Yes                      +----------+------------+---------+-----------+----------+-------+ Brachial      Full       Yes       Yes                      +----------+------------+---------+-----------+----------+-------+ Radial       Partial      Yes       Yes              Limited +----------+------------+---------+-----------+----------+-------+ Ulnar         Full       Yes       Yes                      +----------+------------+---------+-----------+----------+-------+ Cephalic      None       No        No                       +----------+------------+---------+-----------+----------+-------+ Basilic       Full       Yes       Yes                      +----------+------------+---------+-----------+----------+-------+ Innominate    Full       Yes       Yes                      +----------+------------+---------+-----------+----------+-------+ Right radial vein unable to properly compresss due to swelling in lateral arm possibly due to SVT in cephalic v. Patent by color and pulsed wave doppler.  Left Findings: +----------+------------+---------+-----------+----------+-------+ LEFT      CompressiblePhasicitySpontaneousPropertiesSummary +----------+------------+---------+-----------+----------+-------+ Subclavian               Yes       Yes                      +----------+------------+---------+-----------+----------+-------+  Summary:  Right: Findings consistent with age indeterminate superficial vein thrombosis involving the right cephalic vein.  Left: No evidence of thrombosis in the subclavian.  *See table(s) above for measurements and observations.  Diagnosing physician: Gerarda Fraction Electronically signed by Gerarda Fraction on 01/25/2023 at 12:33:36 PM.    Final    DG Abd Portable 1V Result Date: 01/23/2023 CLINICAL DATA:  Feeding tube placement. EXAM: PORTABLE ABDOMEN - 1 VIEW COMPARISON:  None Available. FINDINGS: Feeding tube tip in the distal stomach. Normal bowel gas pattern. Unchanged cardiomegaly and left-greater-than-right pleural effusions with bibasilar atelectasis. No acute osseous abnormality. IMPRESSION: 1. Feeding tube tip in the distal stomach. Electronically Signed   By:  Obie Dredge M.D.   On: 01/23/2023 11:01   US ABDOMEN LIMITED WITH LIVER DOPPLER Result Date: 01/22/2023 CLINICAL  DATA:  Ascites. EXAM: DUPLEX ULTRASOUND OF LIVER TECHNIQUE: Color and duplex Doppler ultrasound was performed to evaluate the hepatic in-flow and out-flow vessels. COMPARISON:  CTA 01/21/2023.  Contrast CT 01/19/2023. FINDINGS: Liver: Normal parenchymal echogenicity. Normal hepatic contour without nodularity. No focal lesion, mass or intrahepatic biliary ductal dilatation. Liver appears slightly enlarged. Main Portal Vein size: 12 mm cm Portal Vein Velocities Main Prox:  24.7 cm/sec Main Mid: 32.0 cm/sec Main Dist:  20.9 cm/sec Right: 18.0 cm/sec Left: 13.0 cm/sec Hepatic Vein Velocities Right:  27.9 cm/sec Middle:  34.1 cm/sec Left:  30.1 cm/sec IVC: Preserved flow on color Doppler Hepatic Artery Velocity:  188 cm/sec Splenic Vein Velocity:  16.4 cm/sec Spleen: 13.1 cm x 6.1 cm x 6.0 cm with a total volume of 253.1 cm^3 (411 cm^3 is upper limit normal) Portal Vein Occlusion/Thrombus: No Splenic Vein Occlusion/Thrombus: No Ascites: None. Varices: None Small pleural effusions. IMPRESSION: Appropriate flow within the hepatic vasculature. Hepatosplenomegaly. Mild pleural effusions. Please correlate With multiple prior CT examinations in the last week. Electronically Signed   By: Karen Kays M.D.   On: 01/22/2023 19:44   DG Chest Port 1 View Result Date: 01/22/2023 CLINICAL DATA:  Status post thoracentesis. EXAM: PORTABLE CHEST 1 VIEW COMPARISON:  Radiograph 01/19/2023, CT yesterday FINDINGS: No evidence of pneumothorax post thoracentesis. Left pleural effusion appears diminished with residual opacity at the left lung base. There is a small right pleural effusion and basilar opacity. Cardiomegaly is unchanged. IMPRESSION: 1. No pneumothorax post thoracentesis. 2. Decreased left pleural effusion with residual opacity at the left lung base. 3. Small right pleural effusion and basilar opacity. 4.  Unchanged cardiomegaly. Electronically Signed   By: Narda Rutherford M.D.   On: 01/22/2023 18:54   CT Angio Chest/Abd/Pel for Dissection W and/or W/WO Result Date: 01/21/2023 CLINICAL DATA:  Follow-up thoracic aortic intramural hematoma EXAM: CT ANGIOGRAPHY CHEST, ABDOMEN AND PELVIS TECHNIQUE: Non-contrast CT of the chest was initially obtained. Multidetector CT imaging through the chest, abdomen and pelvis was performed using the standard protocol during bolus administration of intravenous contrast. Multiplanar reconstructed images and MIPs were obtained and reviewed to evaluate the vascular anatomy. RADIATION DOSE REDUCTION: This exam was performed according to the departmental dose-optimization program which includes automated exposure control, adjustment of the mA and/or kV according to patient size and/or use of iterative reconstruction technique. CONTRAST:  75mL OMNIPAQUE IOHEXOL 350 MG/ML SOLN COMPARISON:  CT chest angiogram, 01/19/2023 FINDINGS: CTA CHEST FINDINGS VASCULAR Aorta: Satisfactory opacification of the aorta. Unchanged contour and caliber of the descending thoracic aorta again with a posterior intramural hematoma arising just distal to the left subclavian artery origin. Maximum caliber of the proximal descending thoracic aorta 3.8 x 3.8 cm (series 7, image 55). Cardiovascular: No evidence of pulmonary embolism on limited non-tailored examination. Gross cardiomegaly. Unchanged small pericardial effusion. Review of the MIP images confirms the above findings. NON VASCULAR Mediastinum/Nodes: No enlarged mediastinal, hilar, or axillary lymph nodes. Thyroid gland, trachea, and esophagus demonstrate no significant findings. Lungs/Pleura: Moderate left, small right pleural effusions, increased in volume compared to prior examination. Musculoskeletal: No chest wall abnormality. No acute osseous findings. Review of the MIP images confirms the above findings. CTA ABDOMEN AND PELVIS FINDINGS VASCULAR  Intramural hematoma extends into the upper abdominal aorta, to approximately the level of the celiac origin, maximum caliber of the vessel in this vicinity 3.3 x 3.1 cm (series 7, image 134). Standard branching pattern of the abdominal aorta with solitary bilateral renal arteries. Review of the MIP images  confirms the above findings. NON-VASCULAR Hepatobiliary: No solid liver abnormality is seen. No gallstones, gallbladder wall thickening, or biliary dilatation. Pancreas: Unremarkable. No pancreatic ductal dilatation or surrounding inflammatory changes. Spleen: Mild splenomegaly, maximum span 13.3 cm. Adrenals/Urinary Tract: Adrenal glands are unremarkable. Under rotated kidneys. Kidneys are otherwise normal, without renal calculi, solid lesion, or hydronephrosis. Bladder is unremarkable. Stomach/Bowel: Stomach is within normal limits. Appendix not clearly visualized. No evidence of bowel wall thickening, distention, or inflammatory changes. Lymphatic: No enlarged abdominal or pelvic lymph nodes. Reproductive: Uterine fibroids. Other: Small fat containing midline epigastric hernia (series 7, image 185). Severe anasarca. Small volume perihepatic and perisplenic ascites. Musculoskeletal: No acute osseous findings. IMPRESSION: 1. Unchanged contour and caliber of the descending thoracic aorta again with a posterior intramural hematoma arising just distal to the left subclavian artery origin. Maximum caliber of the proximal descending thoracic aorta 3.8 x 3.8 cm. 2. Intramural hematoma extends into the upper abdominal aorta, to approximately the level of the celiac origin, maximum caliber of the vessel in this vicinity 3.3 x 3.1 cm. 3. Gross cardiomegaly. Unchanged small pericardial effusion. 4. Moderate left, small right pleural effusions, increased in volume compared to prior examination. 5. Severe anasarca and small volume ascites. 6. Mild splenomegaly. 7. Uterine fibroids. Aortic Atherosclerosis (ICD10-I70.0).  Electronically Signed   By: Jearld Lesch M.D.   On: 01/21/2023 14:04   ECHOCARDIOGRAM COMPLETE Result Date: 01/21/2023    ECHOCARDIOGRAM REPORT   Patient Name:   SYMARIA SINDONI Date of Exam: 01/21/2023 Medical Rec #:  [id number]        Height:       63.0 in Accession #:    [id number]       Weight:       154.3 lb Date of Birth:  [birth year]        BSA:          1.732 m Patient Age:    77 years         BP:           97/59 mmHg Patient Gender: F                HR:           65 bpm. Exam Location:  Inpatient Procedure: 2D Echo, Cardiac Doppler and Color Doppler Indications:    Aortic dissection [facility])  History:        Patient has prior history of Echocardiogram examinations, most                 recent 09/30/2019. Stroke, Arrythmias:Atrial Fibrillation; Risk                 Factors:Hypertension.  Sonographer:    Darlys Gales Referring Phys: [id number] PAULA B SIMPSON IMPRESSIONS  1. Significant beat to beat variability due to atrial fibrillation.  2. Left ventricular ejection fraction, by estimation, is 45%. The left ventricle has mildly decreased function. The left ventricle has no regional wall motion abnormalities. There is severe concentric left ventricular hypertrophy. Consider CMR to rule out infiltrative cardiomyoathy (i.e amyloid).  3. Right ventricular systolic function is mildly reduced. The right ventricular size is mildly enlarged.  4. Left atrial size was moderately dilated.  5. Right atrial size was severely dilated.  6. A small pericardial effusion is present. Moderate pleural effusion in the left lateral region.  7. The mitral valve is normal in structure. No evidence of mitral valve regurgitation. No evidence of mitral stenosis.  8. Tricuspid valve regurgitation is moderate.  9. The aortic valve is normal in structure. There is mild calcification of the aortic valve. There is mild thickening of the aortic valve. Aortic valve regurgitation is not visualized. No aortic stenosis is present. 10. The inferior  vena cava is dilated in size with <50% respiratory variability, suggesting right atrial pressure of 15 mmHg. There is systolic reversal of hepatic vein flow. 11. No obvious aortic dissection noted on limited views. There is some thickening around the abdominal aorta possibly due to intramural hematoma. FINDINGS  Left Ventricle: Left ventricular ejection fraction, by estimation, is 45 to 50%. The left ventricle has mildly decreased function. The left ventricle has no regional wall motion abnormalities. The left ventricular internal cavity size was normal in size. There is severe concentric left ventricular hypertrophy. Left ventricular diastolic function could not be evaluated due to atrial fibrillation. Left ventricular diastolic function could not be evaluated. Right Ventricle: The right ventricular size is mildly enlarged. No increase in right ventricular wall thickness. Right ventricular systolic function is mildly reduced. Left Atrium: Left atrial size was moderately dilated. Right Atrium: Right atrial size was severely dilated. Pericardium: A small pericardial effusion is present. Mitral Valve: The mitral valve is normal in structure. No evidence of mitral valve regurgitation. No evidence of mitral valve stenosis. Tricuspid Valve: The tricuspid valve is normal in structure. Tricuspid valve regurgitation is moderate . No evidence of tricuspid stenosis. Aortic Valve: The aortic valve is normal in structure. There is mild calcification of the aortic valve. There is mild thickening of the aortic valve. Aortic valve regurgitation is not visualized. No aortic stenosis is present. Aortic valve mean gradient measures 5.0 mmHg. Aortic valve peak gradient measures 8.0 mmHg. Aortic valve area, by VTI measures 2.17 cm. Pulmonic Valve: The pulmonic valve was normal in structure. Pulmonic valve regurgitation is not visualized. No evidence of pulmonic stenosis. Aorta: The aortic root is normal in size and structure. Venous:  The inferior vena cava is dilated in size with less than 50% respiratory variability, suggesting right atrial pressure of 15 mmHg. The inferior vena cava and the hepatic vein show a pattern of systolic flow reversal, suggestive of tricuspid regurgitation. IAS/Shunts: No atrial level shunt detected by color flow Doppler. Additional Comments: There is a moderate pleural effusion in the left lateral region.  LEFT VENTRICLE PLAX 2D LVIDd:         4.00 cm   Diastology LVIDs:         3.10 cm   LV e' medial:    8.27 cm/s LV PW:         1.40 cm   LV E/e' medial:  13.4 LV IVS:        1.50 cm   LV e' lateral:   11.30 cm/s LVOT diam:     1.80 cm   LV E/e' lateral: 9.8 LV SV:         51 LV SV Index:   29 LVOT Area:     2.54 cm  RIGHT VENTRICLE            IVC RV S prime:     9.14 cm/s  IVC diam: 3.00 cm TAPSE (M-mode): 1.2 cm LEFT ATRIUM              Index        RIGHT ATRIUM           Index LA Vol (A2C):   119.0 ml 68.71 ml/m  RA Area:     35.80 cm LA Vol (  A4C):   77.2 ml  44.58 ml/m  RA Volume:   141.00 ml 81.41 ml/m LA Biplane Vol: 101.0 ml 58.32 ml/m  AORTIC VALVE AV Area (Vmax):    2.00 cm AV Area (Vmean):   2.08 cm AV Area (VTI):     2.17 cm AV Vmax:           141.00 cm/s AV Vmean:          100.000 cm/s AV VTI:            0.234 m AV Peak Grad:      8.0 mmHg AV Mean Grad:      5.0 mmHg LVOT Vmax:         111.00 cm/s LVOT Vmean:        81.600 cm/s LVOT VTI:          0.200 m LVOT/AV VTI ratio: 0.85  AORTA Ao Root diam: 2.50 cm Ao Asc diam:  3.30 cm MITRAL VALVE                TRICUSPID VALVE MV Area (PHT): 3.02 cm     TR Peak grad:   28.9 mmHg MV Decel Time: 251 msec     TR Vmax:        269.00 cm/s MV E velocity: 111.00 cm/s                             SHUNTS                             Systemic VTI:  0.20 m                             Systemic Diam: 1.80 cm Aditya Sabharwal Electronically signed by Dorthula Nettles Signature Date/Time: 01/21/2023/1:24:08 PM    Final    CT Angio Chest/Abd/Pel for Dissection W  and/or Wo Contrast Addendum Date: 01/19/2023 ADDENDUM REPORT: 01/19/2023 16:57 ADDENDUM: On further review, the origin of intramural hemorrhage is visible, especially on the sagittal reconstructions with a focal ulceration along the superior aspect of the aortic arch located approximately 1.3 cm distal to the left subclavian origin. This likely is the origin of the intramural hemorrhage which then progressed distally in the thoracic aorta and into the proximal abdominal aorta. Electronically Signed   By: Irish Lack M.D.   On: 01/19/2023 16:57   Result Date: 01/19/2023 CLINICAL DATA:  Suggestion of possible aortic dissection/intramural hemorrhage of the aorta on CT of the abdomen and pelvis earlier today. EXAM: CT ANGIOGRAPHY CHEST, ABDOMEN AND PELVIS TECHNIQUE: Non-contrast CT of the chest was initially obtained. Multidetector CT imaging through the chest, abdomen and pelvis was performed using the standard protocol during bolus administration of intravenous contrast. Multiplanar reconstructed images and MIPs were obtained and reviewed to evaluate the vascular anatomy. RADIATION DOSE REDUCTION: This exam was performed according to the departmental dose-optimization program which includes automated exposure control, adjustment of the mA and/or kV according to patient size and/or use of iterative reconstruction technique. CONTRAST:  75mL OMNIPAQUE IOHEXOL 350 MG/ML SOLN COMPARISON:  CT of the abdomen and pelvis earlier today. FINDINGS: CTA CHEST FINDINGS Cardiovascular: There is aneurysmal disease of the thoracic aorta beginning at the level of the distal arch and continuing into the descending thoracic aorta. Component of acute intramural hemorrhage is suspected beginning at roughly the aortic isthmus  and continuing into the descending thoracic aorta. Maximum caliber of the distal arch including the estimated component of intramural hemorrhage is 3.9 cm. Maximum caliber of the descending thoracic aorta  including intramural hemorrhage is approximately 3.7 cm. There also is a component additional adjacent crescentic fluid along the left lateral aspect of the descending thoracic aorta which appears to be separate from the intramural hemorrhage. The presence of some crescentic fluid could be related to some extraluminal bleeding but is not large. No overt intimal flap is present within the opacified lumen of the thoracic aorta. The intramural hemorrhage does not appear to affect the ascending thoracic aorta or great vessel origins at this time. The heart is substantially enlarged. There is a small amount of pericardial fluid. No significant calcified coronary artery plaque. Central pulmonary arteries are dilated with the main pulmonary artery measuring up to 3.4 cm. Mediastinum/Nodes: No enlarged mediastinal, hilar, or axillary lymph nodes. Thyroid gland, trachea, and esophagus demonstrate no significant findings. Lungs/Pleura: Trace left pleural fluid. Left lower lung atelectasis. No overt pulmonary edema. No pneumothorax. Musculoskeletal: No chest wall abnormality. No acute or significant osseous findings. Review of the MIP images confirms the above findings. CTA ABDOMEN AND PELVIS FINDINGS VASCULAR Aorta: As suspected on the abdominal CT, the intramural hemorrhage of the descending thoracic aorta likely continues into the proximal abdominal aorta but likely ends before origins of the renal arteries. No intimal flap is noted in the abdominal aorta. The abdominal aorta is not aneurysmal. Celiac: Normally patent.  Normally patent branch vessels. SMA: Normally patent. Renals: Normally patent single left renal artery. There are 3 separate normally patent right renal arteries. IMA: Normally patent. Inflow: Normally patent bilateral iliac arteries without aneurysm, stenosis or dissection. Review of the MIP images confirms the above findings. NON-VASCULAR Hepatobiliary: Stable appearance of liver and gallbladder. No  biliary ductal dilatation. Pancreas: Unremarkable. No pancreatic ductal dilatation or surrounding inflammatory changes. Spleen: Stable splenic enlargement. The previously noted anterior splenic lesion is not well appreciated on the arterial phase. 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 Bladder contains excreted contrast related to the prior CT. Stomach/Bowel: No bowel obstruction, inflammation or free intraperitoneal air. Lymphatic: No enlarged lymph nodes identified. Reproductive: Enlarged uterus demonstrating degenerated and calcified fibroids. Other: Stable small ventral hernia. Body wall anasarca without significant ascites or focal abnormal fluid collection. Musculoskeletal: No acute or significant osseous findings. Review of the MIP images confirms the above findings. IMPRESSION: 1. 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 Component of acute intramural hemorrhage is suspected beginning at roughly the aortic isthmus just beyond the left subclavian artery origin and continuing into the descending thoracic aorta. Maximum caliber of the distal arch including the estimated component of intramural hemorrhage is 3.9 cm. Maximum caliber of the descending thoracic aorta including intramural hemorrhage is approximately 3.7 cm. There also is a component of additional adjacent crescentic fluid along the left lateral aspect of the descending thoracic aorta which appears to be separate from the intramural hemorrhage. The presence of some crescentic fluid could be related to some extraluminal bleeding but is not large. This could also be related to reactive fluid. No overt intimal flap is present within the opacified lumen of the thoracic aorta. The intramural hemorrhage does not appear to affect the ascending thoracic aorta or great vessel origins at this time. 2. As suspected  on the abdominal CT, the intramural hemorrhage of the  descending thoracic aorta likely continues into the proximal abdominal aorta but likely ends before origins of the renal arteries. Intramural hemorrhage does not compromise patency of the celiac axis or SMA. 3. Substantial cardiomegaly with small amount of pericardial fluid. 4. Dilated central pulmonary arteries suggesting pulmonary arterial hypertension. 5. Trace left pleural fluid with left lower lung atelectasis. 6. Stable splenic enlargement. The previously noted anterior splenic lesion is not well appreciated on the arterial phase. 7. 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 8. Enlarged uterus demonstrating degenerated and calcified fibroids. 9. Stable small ventral hernia. 10. Body wall anasarca without significant ascites or focal abnormal fluid collection. 11. These results were called by telephone at the time of interpretation on 01/19/2023 at 4:50PM to provider Dr. Fuller Plan, who verbally acknowledged these results. Electronically Signed: By: Irish Lack M.D. On: 01/19/2023 16:50   US Venous Img Lower Bilateral Result Date: 01/19/2023 CLINICAL DATA:  77 year old female with bilateral lower extremity edema. EXAM: BILATERAL LOWER EXTREMITY VENOUS DOPPLER ULTRASOUND TECHNIQUE: Gray-scale sonography with graded compression, as well as color Doppler and duplex ultrasound were performed to evaluate the lower extremity deep venous systems from the level of the common femoral vein and including the common femoral, femoral, profunda femoral, popliteal and calf veins including the posterior tibial, peroneal and gastrocnemius veins when visible. The superficial great saphenous vein was also interrogated. Spectral Doppler was utilized to evaluate flow at rest and with distal augmentation maneuvers in the common femoral, femoral and popliteal veins. COMPARISON:  None Available. FINDINGS: RIGHT LOWER EXTREMITY Common Femoral Vein:  No evidence of thrombus. Normal compressibility, respiratory phasicity and response to augmentation. Saphenofemoral Junction: No evidence of thrombus. Normal compressibility and flow on color Doppler imaging. Profunda Femoral Vein: No evidence of thrombus. Normal compressibility and flow on color Doppler imaging. Femoral Vein: No evidence of thrombus. Normal compressibility, respiratory phasicity and response to augmentation. Popliteal Vein: No evidence of thrombus. Normal compressibility, respiratory phasicity and response to augmentation. Calf Veins: No evidence of thrombus. Normal compressibility and flow on color Doppler imaging. Other Findings:  None. LEFT LOWER EXTREMITY Common Femoral Vein: No evidence of thrombus. Normal compressibility, respiratory phasicity and response to augmentation. Saphenofemoral Junction: No evidence of thrombus. Normal compressibility and flow on color Doppler imaging. Profunda Femoral Vein: No evidence of thrombus. Normal compressibility and flow on color Doppler imaging. Femoral Vein: No evidence of thrombus. Normal compressibility, respiratory phasicity and response to augmentation. Popliteal Vein: No evidence of thrombus. Normal compressibility, respiratory phasicity and response to augmentation. Calf Veins: No evidence of thrombus. Normal compressibility and flow on color Doppler imaging. Other Findings:  None. IMPRESSION: No evidence of bilateral lower extremity deep venous thrombosis. Marliss Coots, MD [facility] Electronically Signed   By: Marliss Coots M.D.   On: 01/19/2023 14:58   CT ABDOMEN PELVIS W CONTRAST Addendum Date: 01/19/2023 ADDENDUM REPORT: 01/19/2023 14:55 ADDENDUM: Critical Value/emergent results were called by telephone at the time of interpretation on 01/19/2023 at 2:55 pm to provider DAVID WELLS , who verbally acknowledged these results. Electronically Signed   By: Jules Schick M.D.   On:  01/19/2023 14:55   Result Date: 01/19/2023 CLINICAL DATA:  Generalized abdominal tenderness to palpation. Prior history of hernia. Concern for obstruction EXAM: CT ABDOMEN AND PELVIS WITH CONTRAST TECHNIQUE: Multidetector CT imaging of the abdomen and pelvis was performed using the standard protocol following bolus administration of intravenous contrast. RADIATION DOSE REDUCTION: This exam was performed according to the departmental dose-optimization  program which includes automated exposure control, adjustment of the mA and/or kV according to patient size and/or use of iterative reconstruction technique. CONTRAST:  OMNIPAQUE IOHEXOL 300 MG/ML  SOLN COMPARISON:  None Available. FINDINGS: Lower chest: There is small left and trace right pleural effusion. There are associated atelectatic changes at the lung bases. No consolidation or lung mass. There is mild-to-moderate cardiomegaly. Partially seen at least mild pericardial effusion. Hepatobiliary: The liver is normal in size. Non-cirrhotic configuration. There is heterogeneous attenuation of the liver, predominantly right hepatic lobe, which may be due to heterogeneous hepatic steatosis. No discrete suspicious mass seen within the limitations of this exam. No intrahepatic or extrahepatic bile duct dilation. No calcified gallstones. Normal gallbladder wall thickness. No pericholecystic inflammatory changes. Pancreas: Unremarkable. No pancreatic ductal dilatation or surrounding inflammatory changes. Spleen: There is a well-circumscribed heterogeneous, mixed hypoattenuating and hyperattenuating 4.9 x 5.9 cm lesion in the anterior aspect of the spleen. This is incompletely characterized on the current exam. Adrenals/Urinary Tract: Adrenal glands are unremarkable. Malrotated bilateral kidneys noted. There are hypoattenuating areas in the left kidney lower pole, reaching up to the cortical surface. The differential diagnosis includes renal infarction versus  pyelonephritis. There are at least 4, subcentimeter sized nonobstructing calculi in the right kidney. No nephroureterolithiasis on the left side. No hydroureteronephrosis on either side. Note is made of bilateral extrarenal pelvis. Unremarkable urinary bladder. Stomach/Bowel: No disproportionate dilation of the small or large bowel loops. No evidence of abnormal bowel wall thickening or inflammatory changes. The appendix is unremarkable. Vascular/Lymphatic: There is small ascites no pneumoperitoneum. No abdominal or pelvic lymphadenopathy, by size criteria. No aneurysmal dilation of the major abdominal arteries. There are mild peripheral atherosclerotic vascular calcifications of the aorta and its major branches. There is crescentic filling defect along the left/posterior wall of the lower thoracic aorta extending inferiorly into the abdomen up to the level of origin of the inferior mesenteric artery. This is nonspecific and differential diagnosis includes thrombosed false lumen in aortic dissection, intramural hematoma or intraluminal thrombus/hematoma, etc. However, there is probable focal dissection flap in the proximal left renal artery (series 3, image 25), favoring aortic dissection. Further imaging of chest with dissection protocol (before and after intravenous contrast) is recommended. There is asymmetric dilated and tortuous left-sided parametrial/gonadal veins, which is nonspecific but in appropriate clinical setting can be seen with pelvic congestion syndrome. Reproductive: Enlarged bulky uterus containing multiple hyperattenuating and calcified masses, favored to represent leiomyomas. No large adnexal mass seen. Other: There is small right paramedian ventral hernia containing portion of fat and ascitic fluid. No herniation of bowel loops. The soft tissues and abdominal wall are otherwise unremarkable. Musculoskeletal: No suspicious osseous lesions. There are mild - moderate multilevel degenerative  changes in the visualized spine. IMPRESSION: 1. There is crescentic filling defect along the left/posterior wall of the lower thoracic aorta extending inferiorly into the abdomen up to the level of origin of inferior mesenteric artery. There is probable dissection flap extending into the proximal left renal artery which exhibit hypoattenuating area in the lower pole, favored to represent renal infarction. 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 Further imaging with CT angiography chest, abdomen and pelvis as per dissection protocol is recommended. 2. There is a small right paramedian ventral hernia containing portion of fat and ascitic fluid. No herniation of bowel loops. No bowel obstruction. 3. Small left pleural effusion and partially seen pericardial effusion. 4. There is a heterogeneous approximately 4.9 x 5.9 cm  lesion in the anterior spleen, which is incompletely characterized on the current exam. 5. Multiple other nonacute observations, as described above. Electronically Signed: By: Jules Schick M.D. On: 01/19/2023 14:47   DG Chest Port 1 View Result Date: 01/19/2023 CLINICAL DATA:  Chest pain.  Weakness. EXAM: PORTABLE CHEST 1 VIEW COMPARISON:  None Available. FINDINGS: There is diffuse pulmonary vascular congestion. There are left retrocardiac opacities, which may represent combination of atelectasis and/or consolidation. Bilateral lung fields are otherwise clear. No dense consolidation or lung collapse. Apparent blunting of left lateral costophrenic angle may represent pleural effusion versus superimposed soft tissues. Right lateral costophrenic angle is clear. Moderately enlarged cardio-mediastinal silhouette. No acute osseous abnormalities. The soft tissues are within normal limits. IMPRESSION: *Findings favor congestive heart failure/pulmonary edema. Electronically Signed   By: Jules Schick M.D.   On: 01/19/2023 14:20    ASSESSMENT &  PLAN:   Essential thrombocythemia (HCC) # HIGH RISK Essential thrombocytosis-JAK-2 positive-November 2024 complicated  aortic/ dissection/bleeding-  # Today platelets 1 million/improved from 1.3 million few weeks ago.  Patient is currently on Hydrea 1000 mg twice daily.  Continue hydrea to 1000 mg BID-new prescription sent.  Will slowly increase Hydrea as needed with a goal of platelets 500-600.  See below  # Suspect a component of iron deficiency causing extreme thrombocytosis.  Today-iron studies pending.  Recommend patient take Geritol as ordered.  # CAD- A-fib NOV 2024- intramural aortic hematoma [on Eliquis]-currently off anticoagulation.  On aspirin;  recommend hold off fish oil.?  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  # Right shoulder pain- currently undergoing PT-   # Poorly controlled BP/ intramural aortic hematoma- recommend close monitoring for now   I spoke at length with the patient/husband regarding the patient's clinical status/plan of care/prognosis.  Family agreement.   # DISPOSITION: # Repeat BP # follow up in 2 months- MD; labs- cbc/cmp;LDH;-Dr.B  All questions were answered. The patient knows to call the clinic with any problems, questions or concerns.     Earna Coder, MD 02/16/2023 4:03 PM

## 2023-02-16 NOTE — Telephone Encounter (Signed)
Victorino Sparrow, MD  Liisa Picone, Cristal Deer, RVS, RCS Yes please       Previous Messages    ----- Message ----- From: Julien Girt, RCS Sent: 02/13/2023   3:28 PM EST To: Victorino Sparrow, MD Subject: Follow up from progress note 01/25/2023        Hey Dr. Karin Lieu,  You saw this patient when she was admitted on 01/25/2023 and advised for a 1 month follow up with repeat imaging.  There wasn't a staff message so I want to be sure you are wanting a CT Angio chest/ab/pelvis?  Thanks!

## 2023-02-16 NOTE — Assessment & Plan Note (Addendum)
#   HIGH RISK Essential thrombocytosis-JAK-2 positive-November 2024 complicated  aortic/ dissection/bleeding-  # Today platelets 1 million/improved from 1.3 million few weeks ago.  Patient is currently on Hydrea 1000 mg twice daily.  Continue hydrea to 1000 mg BID-new prescription sent.  Will slowly increase Hydrea as needed with a goal of platelets 500-600.  See below  # Suspect a component of iron deficiency causing extreme thrombocytosis.  Today-iron studies pending.  Recommend patient take Geritol as ordered.  # CAD- A-fib NOV 2024- intramural aortic hematoma [on Eliquis]-currently off anticoagulation.  On aspirin;  recommend hold off fish oil.?  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  # Right shoulder pain- currently undergoing PT-   # Poorly controlled BP/ intramural aortic hematoma- recommend close monitoring for now   I spoke at length with the patient/husband regarding the patient's clinical status/plan of care/prognosis.  Family agreement.   # DISPOSITION: # Repeat BP # follow up in 2 months- MD; labs- cbc/cmp;LDH;-Dr.B

## 2023-03-01 NOTE — Progress Notes (Signed)
 Cardiology Office Note:  .   Date:  03/03/2023  ID:  Kristy Sullivan, DOB Sep 07, 1945, MRN [id number] PCP: Patient, No Pcp Per  [facility] Providers Cardiologist:  Deatrice Cage, MD }   History of Present Illness: .   Kristy Sullivan is a 77 y.o. female with history of CVA in 2021, hypertension, hyperlipidemia, persistent atrial fibrillation on Eliquis  CHA2DS2-VASc Score 7, essential thrombocytosis.  She was recently hospitalized November 2020 for which she was admitted for weakness, intermittent abdominal pain and chest pain.  Imaging revealed an aortic intramural hematoma at the level of the arch extending down around the renal arteries.  She had stopped seeing hematology because she wanted to try holistic approach to thrombocytosis.  Due to persistent A-fib with RVR the patient was started on IV esmolol  and in the unit for ongoing management.  She was found to be JAK positive with anasarca and ascites.  CVTS was consulted and she was found to be nonoperative.  She was started on aspirin  and Plavix and was to continue this for 4 weeks, and then start Eliquis  after 4 weeks at the time of discontinuation of Plavix.  She was restarted on Hydroxyurea  for thrombocytosis.  She underwent thoracentesis due to pleural effusion.    She was found to have diastolic dysfunction she was unable to tolerate ARB and spironolactone due to acute kidney injury during hospitalization.  She was discharged on 01/27/2023.  She was to follow-up with oncology.  She did so on 02/16/2023 they suspected that iron deficiency anemia was causing the extreme thrombocytosis she had not yet had amyloidosis screening.  Patient has some confusion concerning her medication regimen.  On questioning, the patient was taking aspirin  but had not been taking Plavix.  Uncertain if this was discontinued by oncology or she was not given any prescription.  When I reviewed her discharge summary Plavix was not listed on prescribed  medications. \  Today she is hypertensive and tachycardic.  She denies any shortness of breath or chest pain.  She is cardiac unaware.  Her husband who is with her is uncertain what medication she is taking.  They bring with them her medication box some of which have empty bottles and she is uncertain if she finished taking them or if she was taking them in the past.  ROS: As above otherwise negative  Studies Reviewed: [name]   EKG Interpretation Date/Time:  Tuesday March 03 2023 14:33:40 EST Ventricular Rate:  102 PR Interval:    QRS Duration:  80 QT Interval:  348 QTC Calculation: 453 R Axis:   -49  Text Interpretation: Atrial fibrillation with rapid ventricular response Left axis deviation When compared with ECG of 19-Jan-2023 11:03, Criteria for Anterior infarct are no longer Present T wave inversion less evident in Anterolateral leads QT has lengthened Confirmed by Jerilynn Collar [id number]) on 03/03/2023 2:45:43 PM    ECHO: 01/21/2023  1. Significant beat to beat variability due to atrial fibrillation.   2. Left ventricular ejection fraction, by estimation, is 45%. The left ventricle has mildly decreased function. The left ventricle has no regional wall motion abnormalities. There is severe concentric left ventricular hypertrophy. Consider CMR to rule out infiltrative cardiomyoathy (i.e amyloid).   3. Right ventricular systolic function is mildly reduced. The right  ventricular size is mildly enlarged.   4. Left atrial size was moderately dilated.   5. Right atrial size was severely dilated.   6. A small pericardial effusion is present. Moderate pleural effusion in the  left lateral region.   7. The mitral valve is normal in structure. No evidence of mitral valve regurgitation. No evidence of mitral stenosis.   8. Tricuspid valve regurgitation is moderate.   9. The aortic valve is normal in structure. There is mild calcification of the aortic valve. There is mild thickening of the  aortic valve. Aortic valve regurgitation is not visualized. No aortic stenosis is present.  10. The inferior vena cava is dilated in size with <50% respiratory variability, suggesting right atrial pressure of 15 mmHg. There is systolic reversal of hepatic vein flow.  11. No obvious aortic dissection noted on limited views. There is some thickening around the abdominal aorta possibly due to intramural hematoma.       Physical Exam:   VS:  BP (!) 168/88 (BP Location: Left Arm, Patient Position: Sitting, Cuff Size: Normal)   Pulse (!) 102   Ht 5' 3 (1.6 m)   Wt 141 lb 3.2 oz (64 kg)   LMP  (LMP Unknown)   SpO2 99%   BMI 25.01 kg/m    Wt Readings from Last 3 Encounters:  03/03/23 141 lb 3.2 oz (64 kg)  02/16/23 147 lb 12.8 oz (67 kg)  01/27/23 162 lb 14.7 oz (73.9 kg)    GEN: Well nourished, well developed in no acute distress NECK: No JVD; No carotid bruits CARDIAC: Irregular RRR, tachycardic, no murmurs, rubs, gallops RESPIRATORY:  Clear to auscultation without rales, wheezing or rhonchi, scant crackles in the bases ABDOMEN: Soft, non-tender, non-distended EXTREMITIES:  No edema; No deformity   ASSESSMENT AND PLAN: .    Atrial fibrillation (persistent): Heart rate is not well-controlled today on exam and via EKG.  The patient will be started on Eliquis  5 mg twice daily, last labs creatinine 0.7.  She does not remember taking Plavix, but has been taking aspirin .  On review of discharge summary it does not appear that Plavix was prescribed, although this was documented in discharge summary.          Due to elevated heart rate carvedilol  is increased to 12.5 mg twice daily.  Blood pressure was very elevated when I initially saw her at 188/110.  I did retake it and it had come down to 168/88.  2.  Hypertension: Not well-controlled today in the office with elevated heart rate.  I have increased carvedilol  to 12.5 mg twice daily.  She will have close follow-up with Dr. Liberty or APP in the  [place] office.  3.  History of aortic intramural thrombus: CVTS was consulted, and she was not found to be an operative candidate.  This extended down into the renal arteries CVTS had asked that she be on aspirin  and Plavix for 1 month.  It does not appear based upon her recollection and medication list of discharge meds that she had been taking the Plavix or prescribed this.  She will need to have repeat imaging.    4.  Essential thrombocytosis: She is being followed by oncology and is on hydroxyurea .  They have also added Geritol to her medication regimen.  She is no longer on cod liver oil.  5.  Hypercholesterolemia: Remains on atorvastatin .  On review of her medications this bottle is empty.  She will need a refill.   Follow-up labs per PCP or Dr. Liberty          Signed, Lamarr HERO. Jerilynn CHOL, ANP, AACC

## 2023-03-03 ENCOUNTER — Ambulatory Visit: Payer: Medicare PPO | Attending: Adult Health | Admitting: Adult Health

## 2023-03-03 ENCOUNTER — Encounter: Payer: Self-pay | Admitting: Adult Health

## 2023-03-03 VITALS — BP 168/88 | HR 102 | Ht 63.0 in | Wt 141.2 lb

## 2023-03-03 DIAGNOSIS — I4891 Unspecified atrial fibrillation: Secondary | ICD-10-CM

## 2023-03-03 DIAGNOSIS — E7800 Pure hypercholesterolemia, unspecified: Secondary | ICD-10-CM | POA: Diagnosis not present

## 2023-03-03 DIAGNOSIS — I10 Essential (primary) hypertension: Secondary | ICD-10-CM

## 2023-03-03 DIAGNOSIS — D75839 Thrombocytosis, unspecified: Secondary | ICD-10-CM | POA: Diagnosis not present

## 2023-03-03 DIAGNOSIS — I7409 Other arterial embolism and thrombosis of abdominal aorta: Secondary | ICD-10-CM

## 2023-03-03 MED ORDER — CARVEDILOL 12.5 MG PO TABS: 12.5000 mg | ORAL_TABLET | Freq: Two times a day (BID) | ORAL | 3 refills | Status: DC

## 2023-03-03 MED ORDER — ATORVASTATIN CALCIUM 40 MG PO TABS: 40.0000 mg | ORAL_TABLET | Freq: Every day | ORAL | 1 refills | Status: DC

## 2023-03-03 MED ORDER — APIXABAN 5 MG PO TABS: 5.0000 mg | ORAL_TABLET | Freq: Two times a day (BID) | ORAL | 1 refills | Status: DC

## 2023-03-03 NOTE — Patient Instructions (Addendum)
 Medication Instructions:  Start Eliquis  5 mg ( Take 1 Tablet Twice Daily). Increase Carvedilol  to 12.5 mg ( Take 1 Tablet Twice Daily). *If you need a refill on your cardiac medications before your next appointment, please call your pharmacy*   Lab Work: No Labs If you have labs (blood work) drawn today and your tests are completely normal, you will receive your results only by: MyChart Message (if you have MyChart) OR A paper copy in the mail If you have any lab test that is abnormal or we need to change your treatment, we will call you to review the results.   Testing/Procedures: No Testing   Follow-Up: At [facility], you and your health needs are our priority.  As part of our continuing mission to provide you with exceptional heart care, we have created designated Provider Care Teams.  These Care Teams include your primary Cardiologist (physician) and Advanced Practice Providers (APPs -  Physician Assistants and Nurse Practitioners) who all work together to provide you with the care you need, when you need it.  We recommend signing up for the patient portal called MyChart.  Sign up information is provided on this After Visit Summary.  MyChart is used to connect with patients for Virtual Visits (Telemedicine).  Patients are able to view lab/test results, encounter notes, upcoming appointments, etc.  Non-urgent messages can be sent to your provider as well.   To learn more about what you can do with MyChart, go to [email or web address].    Your next appointment:   2-3 weeks  Provider:   You will see one of the following Advanced Practice Providers on your designated Care Team:   Lonni Meager, NP Bernardino Bring, PA-C Cadence Franchester, PA-C Tylene Lunch, NP Barnie Hila, NP  or, Deatrice Cage, MD

## 2023-03-17 NOTE — Progress Notes (Signed)
Cardiology Clinic Note   Date: 03/20/2023 ID: [name], DOB [birth year], MRN [id number]  Primary Cardiologist:  Lorine Bears, MD  Patient Profile    Kristy Sullivan is a 78 y.o. female who presents to the clinic today for follow up.     Past medical history significant for: Chronic HFmrEF. Echo 01/21/2023: EF 45%.  No RWMA.  Severe concentric LVH.  Mildly reduced RV function.  Mild RVH.  Moderate LAE. Severe RAE. Small pericardial effusion.  Moderate pleural effusion left lateral region.  Moderate TR.  Mild calcification/thickening of the aortic valve without stenosis.  Dilated IVC, RA pressure 15 mmHg.  No obvious aortic dissection noted on limited views.  There is some thickening around the abdominal aorta are possibly due to intramural hematoma. PAF. 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 CT abdomen pelvis 01/19/2023: There is crescentic filling defect along the left/posterior wall of the lower thoracic aorta extending inferiorly into the abdomen up to the level of origin of inferior mesenteric artery. There is probable dissection flap extending into the proximal left renal artery which exhibit hypoattenuating area in the lower pole, favored to represent renal infarction. 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 Further imaging with CT angiography chest, abdomen and pelvis as per dissection protocol is recommended. There is a small right paramedian ventral hernia containing portion of fat and ascitic fluid. No herniation of bowel loops. No bowel obstruction. Small left pleural effusion and partially seen pericardial effusion. There is a heterogeneous approximately 4.9 x 5.9 cm lesion in the anterior spleen, which is incompletely characterized on the current exam. CTA chest abdomen pelvis 01/19/2023:  Aneurysmal disease of the thoracic aorta beginning at the level of the distal arch and continuing into the  descending thoracic aorta. Component of acute intramural hemorrhage is suspected beginning at roughly the aortic isthmus just beyond the left subclavian artery origin and continuing into the descending thoracic aorta. Maximum caliber of the distal arch including the estimated component of intramural hemorrhage is 3.9 cm. Maximum caliber of the descending thoracic aorta including intramural hemorrhage is approximately 3.7 cm. There also is a component of additional adjacent crescentic fluid along the left lateral aspect of the descending thoracic aorta which appears to be separate from the intramural hemorrhage. The presence of some crescentic fluid could be related to some extraluminal bleeding but is not large. This could also be related to reactive fluid. No overt intimal flap is present within the opacified lumen of the thoracic aorta. The intramural hemorrhage does not appear to affect the ascending thoracic aorta or great vessel origins at this time. As  suspected on the abdominal CT, the intramural hemorrhage of the descending thoracic aorta likely continues into the proximal abdominal aorta but likely ends before origins of the renal arteries. Intramural hemorrhage does not compromise patency of the celiac axis or SMA. Substantial cardiomegaly with small amount of pericardial fluid. Dilated central pulmonary arteries suggesting pulmonary arterial hypertension. Trace left pleural fluid with left lower lung atelectasis. Stable splenic enlargement. The previously noted anterior splenic lesion is not well appreciated on the arterial phase. 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 Enlarged uterus demonstrating degenerated and calcified fibroids. Stable small ventral hernia. Body wall anasarca without significant ascites or focal abnormal fluid collection. ADDENDUM: On further review, the origin of intramural hemorrhage is visible, especially on the sagittal reconstructions with a  focal ulceration along the superior aspect of the aortic arch located approximately 1.3 cm distal to  the left subclavian origin. This likely is the origin of the intramural hemorrhage which then progressed distally in the thoracic aorta and into the proximal abdominal aorta. CTA chest abdomen pelvis 01/21/2023:Unchanged contour and caliber of the descending thoracic aorta again with a posterior intramural hematoma arising just distal to the left subclavian artery origin. Maximum caliber of the proximal descending thoracic aorta 3.8 x 3.8 cm. Intramural hematoma extends into the upper abdominal aorta, to approximately the level of the celiac origin, maximum caliber of the vessel in this vicinity 3.3 x 3.1 cm. Gross cardiomegaly.  Unchanged small pericardial effusion. Moderate left, small right pleural effusions, increased in volume compared to prior examination. Severe anasarca and small volume ascites. Mild splenomegaly. Uterine fibroids. Hypertension. Stroke. Essential thrombocytosis. JAK2 mutation.  In summary, patient presented to [facility] ED on 09/30/2019 for lightheadedness and left sided weakness. Upon EMS arrival she was found to be in afib with HR in the 90s. She was hypertensive in the ED. Labs were notable for thrombocytosis with platelets of >1000. She continued to complain of mild left sided weakness and underwent MRI which showed several punctate acute infarcts in the right parietal lob affecting the cortical and subcortical brain consistent with microembolic infracts in the right MCA branch vessel distribution.  She was admitted and started on metoprolol and Eliquis. Echo showed EF 50-55% with mild LAE and moderate RAE. She was seen by hematology for thrombocytosis and placed on hydroxyurea. She was seen in follow up in August 2021 and was doing well. She was lost to follow up.   Patient presented to the [facility] ED via EMS from home on 01/19/2023 for chest pain and abdominal pain. EMS reported EKG  showed afib with multifocal PVCs. She reported not taking medications for afib or thrombocytosis.  Initial lab: Sodium 138, potassium 3.3, creatinine 1.04, BUN 15, WBC 50.3, hemoglobin 13, platelets 1296, BNP 597.5.  Troponin 81>> 63.  Chest x-ray with findings favoring congestive heart failure/pulmonary edema.  CT abdomen pelvis showed probable aortic dissection as detailed above. No evidence of DVT bilaterally on venous US.  Patient was started on IV esmolol for elevated heart rate.  Patient was transferred to [facility].  Patient underwent thoracentesis for left pleural effusion.  She was evaluated by vascular surgery for intramural aortic hematoma and no surgery recommended.  Echo showed EF 45% with severe concentric LVH concerning for infiltrative cardiomyopathy (further details above).  Cardiology was consulted.  Patient was started on aspirin and Plavix for 4 weeks then discontinue Plavix and start Eliquis.  She was restarted on hydroxyurea for thrombocytosis.  She was unable to tolerate ARB and spironolactone secondary to acute kidney injury during hospitalization.  She was discharged on 01/27/2023.     History of Present Illness    Kristy Sullivan is followed by Dr. Kirke Corin for the above outlined history.  Patient was seen in the office by Joni Reining, NP on 03/03/2023 for hospital follow-up.  She was hypertensive and tachycardic at the time of her visit.  EKG showed A-fib with RVR, 102 bpm.  Patient and husband were confused about current medications.  They brought in a box of medications with some bottles empty and patient was uncertain if she had finished them or had been taking them in the past.  It did not appear patient had been taking Plavix at the time of her follow-up which she was taking aspirin.  She understood that she was going to start Eliquis.  Carvedilol was increased to 12.5 mg  twice daily.  Today, patient is accompanied by her husband. She is somewhat of a poor historian. She  reports she is feeling well. Patient denies shortness of breath, dyspnea on exertion, lower extremity edema, orthopnea or PND. No chest pain, pressure, or tightness. She generally does not have awareness of arrhythmia unless she is doing more vigorous activities. She reports compliance with medications and states she plans on staying on her medicines. Discussed next steps for testing and she agrees to MRI. She would rather take things slowly and is not interested in an EP referral or cardioversion at this time stating "I feel good."     ROS: All other systems reviewed and are otherwise negative except as noted in History of Present Illness.  EKGs/Labs Reviewed    EKG Interpretation Date/Time:  Friday March 20 2023 10:40:39 EST Ventricular Rate:  78 PR Interval:    QRS Duration:  78 QT Interval:  384 QTC Calculation: 437 R Axis:   -31  Text Interpretation: Atrial fibrillation Left axis deviation When compared with ECG of 03-Mar-2023 14:33, No significant change was found Confirmed by Carlos Levering [phone]) on 03/20/2023 10:49:26 AM   02/16/2023: ALT 28; AST 22; BUN 23; Creatinine 0.77; Potassium 4.3; Sodium 140   02/16/2023: Hemoglobin 10.9; WBC Count 8.3   No results found for requested labs within last 365 days.   01/19/2023: B Natriuretic Peptide 597.5   Risk Assessment/Calculations     CHA2DS2-VASc Score = 7   This indicates a 11.2% annual risk of stroke. The patient's score is based upon: CHF History: 1 HTN History: 1 Diabetes History: 0 Stroke History: 2 Vascular Disease History: 0 Age Score: 2 Gender Score: 1    HYPERTENSION CONTROL Vitals:   03/20/23 1030 03/20/23 1214  BP: (!) 140/90 (!) 140/82    The patient's blood pressure is elevated above target today.  In order to address the patient's elevated BP: A new medication was prescribed today.           Physical Exam    VS:  BP (!) 140/82 (BP Location: Left Arm, Patient Position: Sitting, Cuff  Size: Normal)   Pulse 78   Ht 5\' 3"  (1.6 m)   Wt 142 lb (64.4 kg)   LMP  (LMP Unknown)   BMI 25.15 kg/m  , BMI Body mass index is 25.15 kg/m.  GEN: Well nourished, well developed, in no acute distress. Neck: No JVD or carotid bruits. Cardiac: Irregular rhythm. No murmurs. No rubs or gallops.   Respiratory:  Respirations regular and unlabored. Clear to auscultation without rales, wheezing or rhonchi. GI: Soft, nontender, nondistended. Extremities: Radials/DP/PT 2+ and equal bilaterally. No clubbing or cyanosis. No edema.  Skin: Warm and dry, no rash. Neuro: Strength intact.  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  Patient denies lower extremity edema, shortness of breath, DOE, orthopnea or PND.  Euvolemic and well compensated on exam. -Continue carvedilol. -Start losartan 25 mg daily.  -Schedule cardiac MRI.  PAF Patient does not have cardiac awareness of arrhythmia. If she does more vigorous activity she does have mild palpitations. She reports compliance with medications. Denies spontaneous bleeding concerns. EKG shows afib, 78 bpm. She would like to defer EP referral or cardioversion at this time.  -Continue carvedilol, Eliquis. Appropriate Eliquis dose.  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  Patient evaluated by [facility]  who felt medical management would be most appropriate for patient and son was in agreement. -Continue to follow with [facility].  Hypertension BP today 140/90 on intake and 140/82 on my recheck.  -Continue carvedilol. -Add Losartan 25 mg daily.   Essential thrombocytosis/JAK2 positive Was being followed by hematology/oncology for this in 2021 and started on hydroxyurea.  Patient was last to follow-up.  She stopped medication secondary to wanting to take a holistic  approach.  Hospital admission in November 2024 demonstrated WBC 50.3, platelets 1296.  [facility] 02/16/2023 showed WBC 8.3, platelets 1048.  Now back on hydroxyurea.  She followed up with hematology and iron deficiency anemia is suspected as cause for thrombocytosis.   Patient reports she is taking her medications as prescribed. -Continue to follow with hematology/oncology.  Disposition: Cardiac MRI. BMP today. Return in 2-3 months with Dr. Kirke Corin after testing.          Signed, Etta Grandchild. Atlee Kluth, DNP, NP-C

## 2023-03-20 ENCOUNTER — Encounter: Payer: Self-pay | Admitting: Student

## 2023-03-20 ENCOUNTER — Ambulatory Visit: Payer: Medicare PPO | Attending: Student | Admitting: Student

## 2023-03-20 VITALS — BP 140/82 | HR 78 | Ht 63.0 in | Wt 142.0 lb

## 2023-03-20 DIAGNOSIS — Z79899 Other long term (current) drug therapy: Secondary | ICD-10-CM

## 2023-03-20 DIAGNOSIS — I4811 Longstanding persistent atrial fibrillation: Secondary | ICD-10-CM

## 2023-03-20 DIAGNOSIS — D45 Polycythemia vera: Secondary | ICD-10-CM

## 2023-03-20 DIAGNOSIS — I5022 Chronic systolic (congestive) heart failure: Secondary | ICD-10-CM | POA: Diagnosis not present

## 2023-03-20 DIAGNOSIS — I10 Essential (primary) hypertension: Secondary | ICD-10-CM

## 2023-03-20 DIAGNOSIS — D473 Essential (hemorrhagic) thrombocythemia: Secondary | ICD-10-CM

## 2023-03-20 DIAGNOSIS — I517 Cardiomegaly: Secondary | ICD-10-CM | POA: Diagnosis not present

## 2023-03-20 DIAGNOSIS — I7100 Dissection of unspecified site of aorta: Secondary | ICD-10-CM

## 2023-03-20 MED ORDER — LOSARTAN POTASSIUM 25 MG PO TABS: 25.0000 mg | ORAL_TABLET | Freq: Every day | ORAL | 3 refills | Status: DC

## 2023-03-20 NOTE — Patient Instructions (Signed)
Medication Instructions:  Your physician recommends the following medication changes.  START TAKING: Losartan 25 mg daily  *If you need a refill on your cardiac medications before your next appointment, please call your pharmacy*   Lab Work: Your provider would like for you to have following labs drawn today BMP.   If you have labs (blood work) drawn today and your tests are completely normal, you will receive your results only by: MyChart Message (if you have MyChart) OR A paper copy in the mail If you have any lab test that is abnormal or we need to change your treatment, we will call you to review the results.   Testing/Procedures:  [facility] [address] [place], [place] [address] [phone] Please go to the [facility] and check-in with the desk attendant.   Magnetic resonance imaging (MRI) is a painless test that produces images of the inside of the body without using Xrays.  During an MRI, strong magnets and radio waves work together in a Data processing manager to form detailed images.   MRI images may provide more details about a medical condition than X-rays, CT scans, and ultrasounds can provide.  You may be given earphones to listen for instructions.  You may eat a light breakfast and take medications as ordered with the exception of furosemide, hydrochlorothiazide, chlorthalidone or spironolactone (or any other fluid pill). If you are undergoing a stress MRI, please avoid stimulants for 12 hr prior to test. (I.e. Caffeine, nicotine, chocolate, or antihistamine medications)  An IV will be inserted into one of your veins. Contrast material will be injected into your IV. It will leave your body through your urine within a day. You may be told to drink plenty of fluids to help flush the contrast material out of your system.  You will be asked to remove all metal, including: Watch, jewelry, and other metal objects including hearing aids, hair  pieces and dentures. Also wearable glucose monitoring systems (ie. Freestyle Libre and Omnipods) (Braces and fillings normally are not a problem.)   TEST WILL TAKE APPROXIMATELY 1 HOUR  PLEASE NOTIFY SCHEDULING AT LEAST 24 HOURS IN ADVANCE IF YOU ARE UNABLE TO KEEP YOUR APPOINTMENT. [phone]  For more information and frequently asked questions, please visit our website : [email or web address]  Please call the Cardiac Imaging Nurse Navigators with any questions/concerns. [phone] Office     Follow-Up: At [facility], you and your health needs are our priority.  As part of our continuing mission to provide you with exceptional heart care, we have created designated Provider Care Teams.  These Care Teams include your primary Cardiologist (physician) and Advanced Practice Providers (APPs -  Physician Assistants and Nurse Practitioners) who all work together to provide you with the care you need, when you need it.  We recommend signing up for the patient portal called "MyChart".  Sign up information is provided on this After Visit Summary.  MyChart is used to connect with patients for Virtual Visits (Telemedicine).  Patients are able to view lab/test results, encounter notes, upcoming appointments, etc.  Non-urgent messages can be sent to your provider as well.   To learn more about what you can do with MyChart, go to [email or web address].    Your next appointment:   3 month(s) (after cardiac MRI)  Provider:   You may see Lorine Bears, MD

## 2023-03-21 LAB — BASIC METABOLIC PANEL
BUN/Creatinine Ratio: 26 (ref 12–28)
BUN: 27 mg/dL (ref 8–27)
CO2: 24 mmol/L (ref 20–29)
Calcium: 10.2 mg/dL (ref 8.7–10.3)
Chloride: 103 mmol/L (ref 96–106)
Creatinine, Ser: 1.03 mg/dL — ABNORMAL HIGH (ref 0.57–1.00)
Glucose: 93 mg/dL (ref 70–99)
Potassium: 4.6 mmol/L (ref 3.5–5.2)
Sodium: 141 mmol/L (ref 134–144)
eGFR: 55 mL/min/{1.73_m2} — ABNORMAL LOW (ref >59)

## 2023-03-23 ENCOUNTER — Other Ambulatory Visit: Payer: Self-pay | Admitting: Emergency Medicine

## 2023-03-23 DIAGNOSIS — Z79899 Other long term (current) drug therapy: Secondary | ICD-10-CM

## 2023-03-25 ENCOUNTER — Other Ambulatory Visit: Payer: Self-pay | Admitting: Adult Health

## 2023-03-31 ENCOUNTER — Other Ambulatory Visit: Payer: Self-pay | Admitting: *Deleted

## 2023-03-31 DIAGNOSIS — Z79899 Other long term (current) drug therapy: Secondary | ICD-10-CM

## 2023-04-01 LAB — BASIC METABOLIC PANEL
BUN/Creatinine Ratio: 24 (ref 12–28)
BUN: 22 mg/dL (ref 8–27)
CO2: 24 mmol/L (ref 20–29)
Calcium: 9.6 mg/dL (ref 8.7–10.3)
Chloride: 105 mmol/L (ref 96–106)
Creatinine, Ser: 0.93 mg/dL (ref 0.57–1.00)
Glucose: 101 mg/dL — ABNORMAL HIGH (ref 70–99)
Potassium: 4.7 mmol/L (ref 3.5–5.2)
Sodium: 141 mmol/L (ref 134–144)
eGFR: 63 mL/min/{1.73_m2} (ref >59)

## 2023-04-10 ENCOUNTER — Ambulatory Visit: Payer: Medicare PPO | Admitting: Internal Medicine

## 2023-04-28 ENCOUNTER — Encounter: Payer: Self-pay | Admitting: Internal Medicine

## 2023-04-28 ENCOUNTER — Inpatient Hospital Stay: Payer: Medicare Other | Admitting: Internal Medicine

## 2023-04-28 ENCOUNTER — Inpatient Hospital Stay: Payer: Medicare Other | Attending: Internal Medicine

## 2023-04-28 ENCOUNTER — Other Ambulatory Visit: Payer: Self-pay | Admitting: Adult Health

## 2023-04-28 VITALS — BP 141/90 | HR 92 | Temp 98.0°F | Ht 63.0 in | Wt 146.4 lb

## 2023-04-28 DIAGNOSIS — D473 Essential (hemorrhagic) thrombocythemia: Secondary | ICD-10-CM

## 2023-04-28 LAB — CBC WITH DIFFERENTIAL (CANCER CENTER ONLY)
Abs Immature Granulocytes: 0 10*3/uL (ref 0.00–0.07)
Basophils Absolute: 0 10*3/uL (ref 0.0–0.1)
Basophils Relative: 0 %
Eosinophils Absolute: 0 10*3/uL (ref 0.0–0.5)
Eosinophils Relative: 1 %
HCT: 24.1 % — ABNORMAL LOW (ref 36.0–46.0)
Hemoglobin: 8.2 g/dL — ABNORMAL LOW (ref 12.0–15.0)
Immature Granulocytes: 0 %
Lymphocytes Relative: 50 %
Lymphs Abs: 1 10*3/uL (ref 0.7–4.0)
MCH: 39 pg — ABNORMAL HIGH (ref 26.0–34.0)
MCHC: 34 g/dL (ref 30.0–36.0)
MCV: 114.8 fL — ABNORMAL HIGH (ref 80.0–100.0)
Monocytes Absolute: 0.2 10*3/uL (ref 0.1–1.0)
Monocytes Relative: 8 %
Neutro Abs: 0.8 10*3/uL — ABNORMAL LOW (ref 1.7–7.7)
Neutrophils Relative %: 41 %
Platelet Count: 83 10*3/uL — ABNORMAL LOW (ref 150–400)
RBC: 2.1 MIL/uL — ABNORMAL LOW (ref 3.87–5.11)
RDW: 22 % — ABNORMAL HIGH (ref 11.5–15.5)
WBC Count: 1.9 10*3/uL — ABNORMAL LOW (ref 4.0–10.5)
nRBC: 1 % — ABNORMAL HIGH (ref 0.0–0.2)

## 2023-04-28 LAB — CMP (CANCER CENTER ONLY)
ALT: 15 U/L (ref 0–44)
AST: 20 U/L (ref 15–41)
Albumin: 3.8 g/dL (ref 3.5–5.0)
Alkaline Phosphatase: 84 U/L (ref 38–126)
Anion gap: 6 (ref 5–15)
BUN: 24 mg/dL — ABNORMAL HIGH (ref 8–23)
CO2: 26 mmol/L (ref 22–32)
Calcium: 9.5 mg/dL (ref 8.9–10.3)
Chloride: 105 mmol/L (ref 98–111)
Creatinine: 0.99 mg/dL (ref 0.44–1.00)
GFR, Estimated: 58 mL/min — ABNORMAL LOW (ref >60)
Glucose, Bld: 119 mg/dL — ABNORMAL HIGH (ref 70–99)
Potassium: 4.4 mmol/L (ref 3.5–5.1)
Sodium: 137 mmol/L (ref 135–145)
Total Bilirubin: 2.3 mg/dL — ABNORMAL HIGH (ref 0.0–1.2)
Total Protein: 6.6 g/dL (ref 6.5–8.1)

## 2023-04-28 LAB — VITAMIN B12: Vitamin B-12: 267 pg/mL (ref 180–914)

## 2023-04-28 LAB — LACTATE DEHYDROGENASE: LDH: 237 U/L — ABNORMAL HIGH (ref 98–192)

## 2023-04-28 LAB — FOLATE: Folate: 27 ng/mL (ref >5.9)

## 2023-04-28 NOTE — Patient Instructions (Addendum)
 HOLD hydrea for 1 week; and then re-start on MARCH 4th- at 1 pill a day.

## 2023-04-28 NOTE — Assessment & Plan Note (Addendum)
#   HIGH RISK Essential thrombocytosis-JAK-2 positive-November 2024 complicated  aortic/ dissection/bleeding-  #  Patient is currently on Hydrea 1000 mg twice daily- however pancytopenia noted secondary to hydrea.  HOLD hydrea for 1 week; and then re-start back at 1 pill a day.   # CAD- A-fib NOV 2024- intramural aortic hematoma [on Eliquis]-currently off anticoagulation.  On aspirin;  recommend hold off fish oil.?  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   # Poorly controlled BP/ intramural aortic hematoma- recommend close monitoring for now   I spoke at length with the patient/husband regarding the patient's clinical status/plan of care/prognosis.  Family agreement.   # DISPOSITION: # follow up in 2  weeks- MD; labs- cbc/cmp;LDH;-Dr.B

## 2023-04-28 NOTE — Progress Notes (Signed)
 Easy bruising: NO Petechiae (bleeding under skin): NO Gingival bleeding (gums): NO Epistaxis (nose bleeds): SLIGHT AT TIMES Hematochezia (blood in stools): NO Hematuria (blood in urine): NO  Sunday at church she felt hot and blacked out, called EMS, not sure what happened.

## 2023-04-28 NOTE — Progress Notes (Signed)
 [facility] CONSULT NOTE  Patient Care Team: Patient, No Pcp Per as PCP - General (General Practice) Iran Ouch, MD as PCP - Cardiology (Cardiology) Earna Coder, MD as Consulting Physician (Internal Medicine)  CHIEF COMPLAINTS/PURPOSE OF CONSULTATION:  Essential thrombocytosis  #  Oncology History Overview Note   # July 2021-HIGH RISK Essential thrombocytosis-JAK-2 positive- / A.fib- TIA- currently on hydrea ; AUG 9th 1000 mg BID.   #A. fib with RVR-July 2021; August 9-Eliquis/baby aspirin   Essential thrombocythemia (HCC)  09/30/2019 Initial Diagnosis   Essential thrombocythemia (HCC)      HISTORY OF PRESENTING ILLNESS: Patient ambulating-with assistance/rolling walker. Accompanied by husband.   Kristy Sullivan 78 y.o.  female  with a poorly controlled hypertension, Hx of aortic dissection. hyperlipidemia, A-fib on Eliquis, CVA-/Essential Thrombocytosis on hydrea 1000 mg BID; is here for a follow up.   Complains of ongoing fatigue. No fevers or chills. No bleeding.   Denies any blood in stools or black or stools.  Denies any worsening weakness.  No nausea no vomiting.  No diarrhea.  No skin rash. No further hospitalizations.   Review of Systems  Constitutional:  Negative for chills, diaphoresis, fever, malaise/fatigue and weight loss.  HENT:  Negative for nosebleeds and sore throat.   Eyes:  Negative for double vision.  Respiratory:  Negative for cough, hemoptysis, sputum production, shortness of breath and wheezing.   Cardiovascular:  Negative for chest pain, palpitations, orthopnea and leg swelling.  Gastrointestinal:  Negative for abdominal pain, blood in stool, constipation, diarrhea, heartburn, melena, nausea and vomiting.  Genitourinary:  Negative for dysuria, frequency and urgency.  Musculoskeletal:  Positive for back pain and joint pain.  Skin: Negative.  Negative for itching and rash.  Neurological:  Negative for dizziness, tingling,  focal weakness, weakness and headaches.  Endo/Heme/Allergies:  Does not bruise/bleed easily.  Psychiatric/Behavioral:  Negative for depression. The patient is not nervous/anxious and does not have insomnia.      MEDICAL HISTORY:  Past Medical History:  Diagnosis Date   Embolic stroke (HCC)    a. 09/2019 MRI: several punctate acute infarcts in R parietal lobe affecting the cortical and subcortical brain consistent w/ micro embolic infarcts in R MCA branch vessel distribution.   Hyperlipidemia    Hypertension    Persistent atrial fibrillation (HCC)    a. 09/2019 Dx in setting of ED visit for weakness-->embolic stroke; b. CHA2DS2VASc = 6-->eliquis; c. 09/2019 Echo: EF 50-55%, no rwma, mild LVH. Nl RV size/fxn. Mild LAE, mod RAE. Mild to mod MR.   Thrombocythemia, essential (HCC)    JAK2 positive    SURGICAL HISTORY: History reviewed. No pertinent surgical history.  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    Social Drivers of Corporate investment banker Strain: Not on file  Food Insecurity: No Food Insecurity (  01/19/2023)   Hunger Vital Sign    Worried About Running Out of Food in the Last Year: Never true    Ran Out of Food in the Last Year: Never true  Transportation Needs: No Transportation Needs (01/19/2023)   PRAPARE - Administrator, Civil Service (Medical): No    Lack of Transportation (Non-Medical): No   Physical Activity: Not on file  Stress: Not on file  Social Connections: Not on file  Intimate Partner Violence: Not At Risk (01/19/2023)   Humiliation, Afraid, Rape, and Kick questionnaire    Fear of Current or Ex-Partner: No    Emotionally Abused: No    Physically Abused: No    Sexually Abused: No    FAMILY HISTORY: Family History  Problem Relation Age of Onset   High blood pressure Mother    Hypertension Brother     ALLERGIES:  has no known allergies.  MEDICATIONS:  Current Outpatient Medications  Medication Sig Dispense Refill   atorvastatin (LIPITOR) 40 MG tablet TAKE 1 TABLET BY MOUTH EVERY DAY 90 tablet 3   carvedilol (COREG) 12.5 MG tablet Take 1 tablet (12.5 mg total) by mouth 2 (two) times daily. 180 tablet 3   hydroxyurea (HYDREA) 500 MG capsule Take 2 capsules (1,000 mg total) by mouth 2 (two) times daily. May take with food to minimize GI side effects. 120 capsule 4   Iron-Vitamins (GERITOL PO) Take 1 tablet by mouth daily.     losartan (COZAAR) 25 MG tablet Take 1 tablet (25 mg total) by mouth daily. 90 tablet 3   apixaban (ELIQUIS) 5 MG TABS tablet TAKE 1 TABLET BY MOUTH TWICE A DAY 60 tablet 5   No current facility-administered medications for this visit.      Marland Kitchen  PHYSICAL EXAMINATION: ECOG PERFORMANCE STATUS: 1 - Symptomatic but completely ambulatory  Vitals:   04/28/23 1345 04/28/23 1501  BP: (!) 161/101 (!) 141/90  Pulse: 92   Temp: 98 F (36.7 C)   SpO2: 98%      Filed Weights   04/28/23 1345  Weight: 146 lb 6.4 oz (66.4 kg)      Physical Exam Constitutional:      Comments: Ambulating independently.  Accompanied by her niece.  HENT:     Head: Normocephalic and atraumatic.     Mouth/Throat:     Pharynx: No oropharyngeal exudate.  Eyes:     Pupils: Pupils are equal, round, and reactive to light.  Cardiovascular:     Rate and Rhythm: Normal rate. Rhythm irregular.  Pulmonary:     Effort: No respiratory distress.     Breath sounds:  No wheezing.  Abdominal:     General: Bowel sounds are normal. There is no distension.     Palpations: Abdomen is soft. There is no mass.     Tenderness: There is no abdominal tenderness. There is no guarding or rebound.  Musculoskeletal:        General: No tenderness. Normal range of motion.     Cervical back: Normal range of motion and neck supple.  Skin:    General: Skin is warm.  Neurological:     General: No focal deficit present.     Mental Status: She is alert and oriented to person, place, and time.  Psychiatric:        Mood and Affect: Affect normal.      LABORATORY DATA:  I have reviewed the data as listed Lab Results  Component Value Date   WBC  1.9 (L) 04/28/2023   HGB 8.2 (L) 04/28/2023   HCT 24.1 (L) 04/28/2023   MCV 114.8 (H) 04/28/2023   PLT 83 (L) 04/28/2023   Recent Labs    01/23/23 0222 01/23/23 0223 01/23/23 1631 01/24/23 0705 01/25/23 0701 01/27/23 0349 02/16/23 1315 03/20/23 1136 03/31/23 0805 04/28/23 1345  NA  --    < >  --  138   < > 140 140 141 141 137  K  --    < >  --  4.9   < > 3.8 4.3 4.6 4.7 4.4  CL  --    < >  --  108   < > 105 103 103 105 105  CO2  --    < >  --  19*   < > 25 28 24 24 26   GLUCOSE  --    < >  --  154*   < > 101* 127* 93 101* 119*  BUN  --    < >  --  51*   < > 31* 23 27 22  24*  CREATININE  --    < >  --  2.35*   < > 0.97 0.77 1.03* 0.93 0.99  CALCIUM  --    < >  --  9.7   < > 9.9 9.6 10.2 9.6 9.5  GFRNONAA  --    < >  --  21*   < > 59* >60  --   --  58*  PROT 6.1*  --  5.6*  --   --   --  6.6  --   --  6.6  ALBUMIN 2.8*   < > 2.6* 2.6*  --   --  3.4*  --   --  3.8  AST 50*  --  53*  --   --   --  22  --   --  20  ALT 35  --  43  --   --   --  28  --   --  15  ALKPHOS 120  --  128*  --   --   --  232*  --   --  84  BILITOT 1.6*  --  1.2*  --   --   --  1.0  --   --  2.3*  BILIDIR 0.5*  --  0.3*  --   --   --   --   --   --   --   IBILI 1.1*  --  0.9  --   --   --   --   --   --   --    < > = values in this  interval not displayed.    RADIOGRAPHIC STUDIES: I have personally reviewed the radiological images as listed and agreed with the findings in the report. No results found.   ASSESSMENT & PLAN:   Essential thrombocythemia (HCC) # HIGH RISK Essential thrombocytosis-JAK-2 positive-November 2024 complicated  aortic/ dissection/bleeding-  #  Patient is currently on Hydrea 1000 mg twice daily- however pancytopenia noted secondary to hydrea.  HOLD hydrea for 1 week; and then re-start back at 1 pill a day.   # CAD- A-fib NOV 2024- intramural aortic hematoma [on Eliquis]-currently off anticoagulation.  On aspirin;  recommend hold off fish oil.?  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   # Poorly controlled BP/ intramural aortic hematoma- recommend close monitoring for now   I spoke at length with the  patient/husband regarding the patient's clinical status/plan of care/prognosis.  Family agreement.   # DISPOSITION: # follow up in 2  weeks- MD; labs- cbc/cmp;LDH;-Dr.B   All questions were answered. The patient knows to call the clinic with any problems, questions or concerns.     Earna Coder, MD 05/12/2023 10:50 PM

## 2023-04-29 NOTE — Telephone Encounter (Signed)
 Prescription refill request for Eliquis received. Indication: AF Last office visit: 03/20/23  D Wittenborn NP Scr: 0.99 on 04/28/23  Epic Age: 78 Weight: 64.4kg  Based on above findings Eliquis 5mg  twice daily is the appropriate dose.  Refill approved.

## 2023-05-18 ENCOUNTER — Inpatient Hospital Stay: Payer: Medicare Other | Attending: Internal Medicine

## 2023-05-18 ENCOUNTER — Inpatient Hospital Stay (HOSPITAL_BASED_OUTPATIENT_CLINIC_OR_DEPARTMENT_OTHER): Payer: Medicare Other | Admitting: Internal Medicine

## 2023-05-18 ENCOUNTER — Encounter: Payer: Self-pay | Admitting: Internal Medicine

## 2023-05-18 VITALS — BP 140/88 | HR 90 | Temp 98.1°F | Resp 16 | Wt 151.0 lb

## 2023-05-18 DIAGNOSIS — Z7901 Long term (current) use of anticoagulants: Secondary | ICD-10-CM | POA: Insufficient documentation

## 2023-05-18 DIAGNOSIS — D473 Essential (hemorrhagic) thrombocythemia: Secondary | ICD-10-CM

## 2023-05-18 DIAGNOSIS — Z7964 Long term (current) use of myelosuppressive agent: Secondary | ICD-10-CM | POA: Insufficient documentation

## 2023-05-18 DIAGNOSIS — Z79899 Other long term (current) drug therapy: Secondary | ICD-10-CM | POA: Insufficient documentation

## 2023-05-18 LAB — CBC WITH DIFFERENTIAL (CANCER CENTER ONLY)
Abs Immature Granulocytes: 0.01 10*3/uL (ref 0.00–0.07)
Basophils Absolute: 0 10*3/uL (ref 0.0–0.1)
Basophils Relative: 0 %
Eosinophils Absolute: 0 10*3/uL (ref 0.0–0.5)
Eosinophils Relative: 1 %
HCT: 26.4 % — ABNORMAL LOW (ref 36.0–46.0)
Hemoglobin: 8.6 g/dL — ABNORMAL LOW (ref 12.0–15.0)
Immature Granulocytes: 0 %
Lymphocytes Relative: 37 %
Lymphs Abs: 1 10*3/uL (ref 0.7–4.0)
MCH: 41.3 pg — ABNORMAL HIGH (ref 26.0–34.0)
MCHC: 32.6 g/dL (ref 30.0–36.0)
MCV: 126.9 fL — ABNORMAL HIGH (ref 80.0–100.0)
Monocytes Absolute: 0.3 10*3/uL (ref 0.1–1.0)
Monocytes Relative: 10 %
Neutro Abs: 1.3 10*3/uL — ABNORMAL LOW (ref 1.7–7.7)
Neutrophils Relative %: 52 %
Platelet Count: 108 10*3/uL — ABNORMAL LOW (ref 150–400)
RBC: 2.08 MIL/uL — ABNORMAL LOW (ref 3.87–5.11)
RDW: 18.3 % — ABNORMAL HIGH (ref 11.5–15.5)
WBC Count: 2.6 10*3/uL — ABNORMAL LOW (ref 4.0–10.5)
nRBC: 0 % (ref 0.0–0.2)

## 2023-05-18 LAB — CMP (CANCER CENTER ONLY)
ALT: 33 U/L (ref 0–44)
AST: 39 U/L (ref 15–41)
Albumin: 4 g/dL (ref 3.5–5.0)
Alkaline Phosphatase: 100 U/L (ref 38–126)
Anion gap: 6 (ref 5–15)
BUN: 23 mg/dL (ref 8–23)
CO2: 26 mmol/L (ref 22–32)
Calcium: 9.3 mg/dL (ref 8.9–10.3)
Chloride: 104 mmol/L (ref 98–111)
Creatinine: 0.99 mg/dL (ref 0.44–1.00)
GFR, Estimated: 58 mL/min — ABNORMAL LOW (ref >60)
Glucose, Bld: 111 mg/dL — ABNORMAL HIGH (ref 70–99)
Potassium: 3.9 mmol/L (ref 3.5–5.1)
Sodium: 136 mmol/L (ref 135–145)
Total Bilirubin: 2.3 mg/dL — ABNORMAL HIGH (ref 0.0–1.2)
Total Protein: 6.6 g/dL (ref 6.5–8.1)

## 2023-05-18 LAB — LACTATE DEHYDROGENASE: LDH: 236 U/L — ABNORMAL HIGH (ref 98–192)

## 2023-05-18 NOTE — Progress Notes (Signed)
 Patient says that since cutting back on the Hydroxyurea she can taste again, and her appetite has got better. Every now and then she feels like her heart is racing but it comes and goes.

## 2023-05-18 NOTE — Assessment & Plan Note (Addendum)
#   HIGH RISK Essential thrombocytosis-JAK-2 positive-November 2024 complicated  aortic/ dissection/bleeding-  # February 2025-she had pancytopenia while on Hydrea 1000 mg twice a day.  Currently on Hydrea 500 mg a day.  Counts still low but improving. Continue current dose of hydrea of 500 mg/day [pill box]. stable.   # CAD- A-fib NOV 2024- intramural aortic hematoma [on Eliquis]-currently off anticoagulation.  On aspirin;  recommend hold off fish oil.?  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   # Poorly controlled BP/ intramural aortic hematoma- recommend close monitoring for now- stable.   # DISPOSITION: # follow up in  1 month-- MD; labs- cbc/cmp;LDH;-Dr.B

## 2023-05-18 NOTE — Progress Notes (Signed)
 [facility] CONSULT NOTE  Patient Care Team: Patient, No Pcp Per as PCP - General (General Practice) Iran Ouch, MD as PCP - Cardiology (Cardiology) Earna Coder, MD as Consulting Physician (Internal Medicine)  CHIEF COMPLAINTS/PURPOSE OF CONSULTATION:  Essential thrombocytosis  #  Oncology History Overview Note   # July 2021-HIGH RISK Essential thrombocytosis-JAK-2 positive- / A.fib- TIA- currently on hydrea ; AUG 9th 1000 mg BID.   #A. fib with RVR-July 2021; August 9-Eliquis/baby aspirin   Essential thrombocythemia (HCC)  09/30/2019 Initial Diagnosis   Essential thrombocythemia (HCC)      HISTORY OF PRESENTING ILLNESS: Patient ambulating-Ambulating independently.  Alone.   Kristy Sullivan 78 y.o.  female  with a poorly controlled hypertension, Hx of aortic dissection. hyperlipidemia, A-fib on Eliquis, CVA-/Essential Thrombocytosis on hydrea 500mg /day   is here for a follow up.   Patient says that since cutting back on the Hydroxyurea she can taste again, and her appetite has got better. Every now and then she feels like her heart is racing but it comes and goes.   Complains of ongoing mild fatigue. No fevers or chills. No bleeding.   Denies any blood in stools or black or stools.  Denies any worsening weakness.  No nausea no vomiting.  No diarrhea.  No skin rash. No further hospitalizations.   Review of Systems  Constitutional:  Negative for chills, diaphoresis, fever, malaise/fatigue and weight loss.  HENT:  Negative for nosebleeds and sore throat.   Eyes:  Negative for double vision.  Respiratory:  Negative for cough, hemoptysis, sputum production, shortness of breath and wheezing.   Cardiovascular:  Negative for chest pain, palpitations, orthopnea and leg swelling.  Gastrointestinal:  Negative for abdominal pain, blood in stool, constipation, diarrhea, heartburn, melena, nausea and vomiting.  Genitourinary:  Negative for dysuria, frequency  and urgency.  Musculoskeletal:  Positive for back pain and joint pain.  Skin: Negative.  Negative for itching and rash.  Neurological:  Negative for dizziness, tingling, focal weakness, weakness and headaches.  Endo/Heme/Allergies:  Does not bruise/bleed easily.  Psychiatric/Behavioral:  Negative for depression. The patient is not nervous/anxious and does not have insomnia.      MEDICAL HISTORY:  Past Medical History:  Diagnosis Date   Embolic stroke (HCC)    a. 09/2019 MRI: several punctate acute infarcts in R parietal lobe affecting the cortical and subcortical brain consistent w/ micro embolic infarcts in R MCA branch vessel distribution.   Hyperlipidemia    Hypertension    Persistent atrial fibrillation (HCC)    a. 09/2019 Dx in setting of ED visit for weakness-->embolic stroke; b. CHA2DS2VASc = 6-->eliquis; c. 09/2019 Echo: EF 50-55%, no rwma, mild LVH. Nl RV size/fxn. Mild LAE, mod RAE. Mild to mod MR.   Thrombocythemia, essential (HCC)    JAK2 positive    SURGICAL HISTORY: History reviewed. No pertinent surgical history.  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    Social Drivers of Corporate investment banker Strain: Not on file  Food Insecurity: No Food Insecurity (01/19/2023)   Hunger Vital Sign    Worried About Running Out of Food in the Last Year: Never true    Ran Out of Food in the Last Year: Never true   Transportation Needs: No Transportation Needs (01/19/2023)   PRAPARE - Administrator, Civil Service (Medical): No    Lack of Transportation (Non-Medical): No  Physical Activity: Not on file  Stress: Not on file  Social Connections: Not on file  Intimate Partner Violence: Not At Risk (01/19/2023)   Humiliation, Afraid, Rape, and Kick questionnaire    Fear of Current or Ex-Partner: No    Emotionally Abused: No    Physically Abused: No    Sexually Abused: No    FAMILY HISTORY: Family History  Problem Relation Age of Onset   High blood pressure Mother    Hypertension Brother     ALLERGIES:  has no known allergies.  MEDICATIONS:  Current Outpatient Medications  Medication Sig Dispense Refill   apixaban (ELIQUIS) 5 MG TABS tablet TAKE 1 TABLET BY MOUTH TWICE A DAY 60 tablet 5   atorvastatin (LIPITOR) 40 MG tablet TAKE 1 TABLET BY MOUTH EVERY DAY 90 tablet 3   carvedilol (COREG) 12.5 MG tablet Take 1 tablet (12.5 mg total) by mouth 2 (two) times daily. 180 tablet 3   hydroxyurea (HYDREA) 500 MG capsule Take 2 capsules (1,000 mg total) by mouth 2 (two) times daily. May take with food to minimize GI side effects. 120 capsule 4   Iron-Vitamins (GERITOL PO) Take 1 tablet by mouth daily.     losartan (COZAAR) 25 MG tablet Take 1 tablet (25 mg total) by mouth daily. 90 tablet 3   No current facility-administered medications for this visit.      Marland Kitchen  PHYSICAL EXAMINATION: ECOG PERFORMANCE STATUS: 1 - Symptomatic but completely ambulatory  Vitals:   05/18/23 1502  BP: (!) 140/88  Pulse: 90  Resp: 16  Temp: 98.1 F (36.7 C)  SpO2: 100%      Filed Weights   05/18/23 1502  Weight: 151 lb (68.5 kg)       Physical Exam HENT:     Head: Normocephalic and atraumatic.     Mouth/Throat:     Pharynx: No oropharyngeal exudate.  Eyes:     Pupils: Pupils are equal, round, and reactive to light.  Cardiovascular:     Rate and Rhythm: Normal rate. Rhythm irregular.   Pulmonary:     Effort: No respiratory distress.     Breath sounds: No wheezing.  Abdominal:     General: Bowel sounds are normal. There is no distension.     Palpations: Abdomen is soft. There is no mass.     Tenderness: There is no abdominal tenderness. There is no guarding or rebound.  Musculoskeletal:        General: No tenderness. Normal range of motion.     Cervical back: Normal range of motion and neck supple.  Skin:    General: Skin is warm.  Neurological:     General: No focal deficit present.     Mental Status: She is alert and oriented to person, place, and time.  Psychiatric:        Mood and Affect: Affect normal.      LABORATORY DATA:  I have reviewed the data as listed Lab Results  Component Value Date   WBC 2.6 (L) 05/18/2023   HGB 8.6 (L) 05/18/2023   HCT 26.4 (L) 05/18/2023   MCV 126.9 (H) 05/18/2023   PLT 108 (L) 05/18/2023   Recent Labs    01/23/23 0222 01/23/23 0223 01/23/23 1631 01/24/23 0705 02/16/23 1315 03/20/23 1136 03/31/23 0805 04/28/23 1345 05/18/23 1445  NA  --    < >  --    < > 140   < > 141 137 136  K  --    < >  --    < > 4.3   < > 4.7 4.4 3.9  CL  --    < >  --    < > 103   < > 105 105 104  CO2  --    < >  --    < > 28   < > 24 26 26   GLUCOSE  --    < >  --    < > 127*   < > 101* 119* 111*  BUN  --    < >  --    < > 23   < > 22 24* 23  CREATININE  --    < >  --    < > 0.77   < > 0.93 0.99 0.99  CALCIUM  --    < >  --    < > 9.6   < > 9.6 9.5 9.3  GFRNONAA  --    < >  --    < > >60  --   --  58* 58*  PROT 6.1*  --  5.6*  --  6.6  --   --  6.6 6.6  ALBUMIN 2.8*   < > 2.6*   < > 3.4*  --   --  3.8 4.0  AST 50*  --  53*  --  22  --   --  20 39  ALT 35  --  43  --  28  --   --  15 33  ALKPHOS 120  --  128*  --  232*  --   --  84 100  BILITOT 1.6*  --  1.2*  --  1.0  --   --  2.3* 2.3*  BILIDIR 0.5*  --  0.3*  --   --   --   --   --   --   IBILI 1.1*  --  0.9  --   --   --   --   --   --    < > = values in this interval not  displayed.    RADIOGRAPHIC STUDIES: I have personally reviewed the radiological images as listed and agreed with the findings in the report. No results found.   ASSESSMENT & PLAN:   Essential thrombocythemia (HCC) # HIGH RISK Essential thrombocytosis-JAK-2 positive-November 2024 complicated  aortic/ dissection/bleeding-  # February 2025-she had pancytopenia while on Hydrea 1000 mg twice a day.  Currently on Hydrea 500 mg a day.  Counts still low but improving. Continue current dose of hydrea of 500 mg/day [pill box]. stable.   # CAD- A-fib NOV 2024- intramural aortic hematoma [on Eliquis]-currently off anticoagulation.  On aspirin;  recommend hold off fish oil.?  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   # Poorly controlled BP/ intramural aortic hematoma- recommend close monitoring for now- stable.   # DISPOSITION: #  follow up in  1 month-- MD; labs- cbc/cmp;LDH;-Dr.B    All questions were answered. The patient knows to call the clinic with any problems, questions or concerns.     Earna Coder, MD 05/18/2023 3:41 PM

## 2023-05-18 NOTE — Patient Instructions (Signed)
 Continue current dose of hydrea of 500 mg/day.

## 2023-06-02 ENCOUNTER — Encounter (HOSPITAL_COMMUNITY): Payer: Self-pay

## 2023-06-03 ENCOUNTER — Other Ambulatory Visit: Payer: Self-pay | Admitting: Student

## 2023-06-03 ENCOUNTER — Ambulatory Visit
Admission: RE | Admit: 2023-06-03 | Discharge: 2023-06-03 | Disposition: A | Discharge status: Home / Self Care | Payer: Medicare PPO | Source: Ambulatory Visit | Attending: Student | Admitting: Student

## 2023-06-03 DIAGNOSIS — I517 Cardiomegaly: Secondary | ICD-10-CM | POA: Insufficient documentation

## 2023-06-03 MED ORDER — GADOBUTROL 1 MMOL/ML IV SOLN
9.0000 mL | Freq: Once | INTRAVENOUS | Status: AC | PRN
  Administered 2023-06-03: 9 mL via INTRAVENOUS

## 2023-06-19 ENCOUNTER — Inpatient Hospital Stay

## 2023-06-19 ENCOUNTER — Inpatient Hospital Stay: Admitting: Internal Medicine

## 2023-06-19 NOTE — Assessment & Plan Note (Deleted)
#   HIGH RISK Essential thrombocytosis-JAK-2 positive-November 2024 complicated  aortic/ dissection/bleeding-  # February 2025-she had pancytopenia while on Hydrea 1000 mg twice a day.  Currently on Hydrea 500 mg a day.  Counts still low but improving. Continue current dose of hydrea of 500 mg/day [pill box]. stable.   # CAD- A-fib NOV 2024- intramural aortic hematoma [on Eliquis]-currently off anticoagulation.  On aspirin;  recommend hold off fish oil.?  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   # Poorly controlled BP/ intramural aortic hematoma- recommend close monitoring for now- stable.   # DISPOSITION: # follow up in  1 month-- MD; labs- cbc/cmp;LDH;-Dr.B

## 2023-06-19 NOTE — Progress Notes (Deleted)
 [facility] CONSULT NOTE  Patient Care Team: Pcp, No as PCP - General [name], MD as PCP - Cardiology (Cardiology) Gwyn Leos, MD as Consulting Physician (Internal Medicine)  CHIEF COMPLAINTS/PURPOSE OF CONSULTATION:  Essential thrombocytosis  #  Oncology History Overview Note   # July 2021-HIGH RISK Essential thrombocytosis-JAK-2 positive- / A.fib- TIA- currently on hydrea  ; AUG 9th 1000 mg BID.   #A. fib with RVR-July 2021; August 9-Eliquis /baby aspirin    Essential thrombocythemia (HCC)  09/30/2019 Initial Diagnosis   Essential thrombocythemia (HCC)      HISTORY OF PRESENTING ILLNESS: Patient ambulating-Ambulating independently.  Alone.   Kristy Sullivan 78 y.o.  female  with a poorly controlled hypertension, Hx of aortic dissection. hyperlipidemia, A-fib on Eliquis , CVA-/Essential Thrombocytosis on hydrea  500mg /day   is here for a follow up.   Patient says that since cutting back on the Hydroxyurea  she can taste again, and her appetite has got better. Every now and then she feels like her heart is racing but it comes and goes.   Complains of ongoing mild fatigue. No fevers or chills. No bleeding.   Denies any blood in stools or black or stools.  Denies any worsening weakness.  No nausea no vomiting.  No diarrhea.  No skin rash. No further hospitalizations.   Review of Systems  Constitutional:  Negative for chills, diaphoresis, fever, malaise/fatigue and weight loss.  HENT:  Negative for nosebleeds and sore throat.   Eyes:  Negative for double vision.  Respiratory:  Negative for cough, hemoptysis, sputum production, shortness of breath and wheezing.   Cardiovascular:  Negative for chest pain, palpitations, orthopnea and leg swelling.  Gastrointestinal:  Negative for abdominal pain, blood in stool, constipation, diarrhea, heartburn, melena, nausea and vomiting.  Genitourinary:  Negative for dysuria, frequency and urgency.  Musculoskeletal:   Positive for back pain and joint pain.  Skin: Negative.  Negative for itching and rash.  Neurological:  Negative for dizziness, tingling, focal weakness, weakness and headaches.  Endo/Heme/Allergies:  Does not bruise/bleed easily.  Psychiatric/Behavioral:  Negative for depression. The patient is not nervous/anxious and does not have insomnia.      MEDICAL HISTORY:  Past Medical History:  Diagnosis Date  . Embolic stroke (HCC)    a. 09/2019 MRI: several punctate acute infarcts in R parietal lobe affecting the cortical and subcortical brain consistent w/ micro embolic infarcts in R MCA branch vessel distribution.  . Hyperlipidemia   . Hypertension   . Persistent atrial fibrillation (HCC)    a. 09/2019 Dx in setting of ED visit for weakness-->embolic stroke; b. CHA2DS2VASc = 6-->eliquis ; c. 09/2019 Echo: EF 50-55%, no rwma, mild LVH. Nl RV size/fxn. Mild LAE, mod RAE. Mild to mod MR.  . Thrombocythemia, essential (HCC)    JAK2 positive    SURGICAL HISTORY: No past surgical history on file.  SOCIAL HISTORY: Social History   Socioeconomic History  . Marital status: Married    Spouse name: Tommy Minichiello  . Number of children: 3  . Years of education: Not on file  . Highest education level: Not on file  Occupational History  . Not on file  Tobacco Use  . Smoking status: Never  . Smokeless tobacco: Never  Vaping Use  . Vaping status: Never Used  Substance and Sexual Activity  . Alcohol use: Never  . Drug use: Never  . Sexual activity: Never  Other Topics Concern  . 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    Social Drivers of Corporate investment banker Strain: Not on file  Food Insecurity: No Food Insecurity (01/19/2023)   Hunger Vital Sign   . Worried About Programme researcher, broadcasting/film/video in the Last Year: Never true   . Ran Out of Food in the Last Year: Never true  Transportation Needs: No  Transportation Needs (01/19/2023)   PRAPARE - Transportation   . Lack of Transportation (Medical): No   . Lack of Transportation (Non-Medical): No  Physical Activity: Not on file  Stress: Not on file  Social Connections: Not on file  Intimate Partner Violence: Not At Risk (01/19/2023)   Humiliation, Afraid, Rape, and Kick questionnaire   . Fear of Current or Ex-Partner: No   . Emotionally Abused: No   . Physically Abused: No   . Sexually Abused: No    FAMILY HISTORY: Family History  Problem Relation Age of Onset  . High blood pressure Mother   . Hypertension Brother     ALLERGIES:  has no known allergies.  MEDICATIONS:  Current Outpatient Medications  Medication Sig Dispense Refill  . apixaban  (ELIQUIS ) 5 MG TABS tablet TAKE 1 TABLET BY MOUTH TWICE A DAY 60 tablet 5  . atorvastatin  (LIPITOR) 40 MG tablet TAKE 1 TABLET BY MOUTH EVERY DAY 90 tablet 3  . carvedilol  (COREG ) 12.5 MG tablet Take 1 tablet (12.5 mg total) by mouth 2 (two) times daily. 180 tablet 3  . hydroxyurea  (HYDREA ) 500 MG capsule Take 2 capsules (1,000 mg total) by mouth 2 (two) times daily. May take with food to minimize GI side effects. 120 capsule 4  . Iron-Vitamins (GERITOL PO) Take 1 tablet by mouth daily.    . losartan  (COZAAR ) 25 MG tablet Take 1 tablet (25 mg total) by mouth daily. 90 tablet 3   No current facility-administered medications for this visit.      Kristy Sullivan  PHYSICAL EXAMINATION: ECOG PERFORMANCE STATUS: 1 - Symptomatic but completely ambulatory  There were no vitals filed for this visit.     There were no vitals filed for this visit.      Physical Exam HENT:     Head: Normocephalic and atraumatic.     Mouth/Throat:     Pharynx: No oropharyngeal exudate.  Eyes:     Pupils: Pupils are equal, round, and reactive to light.  Cardiovascular:     Rate and Rhythm: Normal rate. Rhythm irregular.  Pulmonary:     Effort: No respiratory distress.     Breath sounds: No wheezing.   Abdominal:     General: Bowel sounds are normal. There is no distension.     Palpations: Abdomen is soft. There is no mass.     Tenderness: There is no abdominal tenderness. There is no guarding or rebound.  Musculoskeletal:        General: No tenderness. Normal range of motion.     Cervical back: Normal range of motion and neck supple.  Skin:    General: Skin is warm.  Neurological:     General: No focal deficit present.     Mental Status: She is alert and oriented to person, place, and time.  Psychiatric:        Mood and Affect: Affect normal.     LABORATORY DATA:  I have reviewed the data as listed Lab Results  Component Value Date   WBC 2.6 (L) 05/18/2023   HGB 8.6 (L) 05/18/2023  HCT 26.4 (L) 05/18/2023   MCV 126.9 (H) 05/18/2023   PLT 108 (L) 05/18/2023   Recent Labs    01/23/23 0222 01/23/23 0223 01/23/23 1631 01/24/23 0705 02/16/23 1315 03/20/23 1136 03/31/23 0805 04/28/23 1345 05/18/23 1445  NA  --    < >  --    < > 140   < > 141 137 136  K  --    < >  --    < > 4.3   < > 4.7 4.4 3.9  CL  --    < >  --    < > 103   < > 105 105 104  CO2  --    < >  --    < > 28   < > 24 26 26   GLUCOSE  --    < >  --    < > 127*   < > 101* 119* 111*  BUN  --    < >  --    < > 23   < > 22 24* 23  CREATININE  --    < >  --    < > 0.77   < > 0.93 0.99 0.99  CALCIUM   --    < >  --    < > 9.6   < > 9.6 9.5 9.3  GFRNONAA  --    < >  --    < > >60  --   --  58* 58*  PROT 6.1*  --  5.6*  --  6.6  --   --  6.6 6.6  ALBUMIN  2.8*   < > 2.6*   < > 3.4*  --   --  3.8 4.0  AST 50*  --  53*  --  22  --   --  20 39  ALT 35  --  43  --  28  --   --  15 33  ALKPHOS 120  --  128*  --  232*  --   --  84 100  BILITOT 1.6*  --  1.2*  --  1.0  --   --  2.3* 2.3*  BILIDIR 0.5*  --  0.3*  --   --   --   --   --   --   IBILI 1.1*  --  0.9  --   --   --   --   --   --    < > = values in this interval not displayed.    RADIOGRAPHIC STUDIES: I have personally reviewed the radiological images  as listed and agreed with the findings in the report. MR CARDIAC MORPHOLOGY W WO CONTRAST Result Date: 06/03/2023 CLINICAL DATA:  Evaluate for infiltrative disease EXAM: CARDIAC MRI TECHNIQUE: The patient was scanned on a 1.5 Tesla Siemens magnet. A dedicated cardiac coil was used. Functional imaging was done using Fiesta sequences. 2,3, and 4 chamber views were done to assess for RWMA's. Modified Simpson's rule using a short axis stack was used to calculate an ejection fraction on a dedicated work Research officer, trade [employer]. The patient received 9 cc of Gadavist . After 10 minutes inversion recovery sequences were used to assess for infiltration and scar tissue. Velocity flow mapping performed in the ascending aorta and main pulmonary artery. CONTRAST:  9 cc  of Gadavist  FINDINGS: 1. Normal left ventricular size, mild LV thickness, moderately reduced LV systolic function (LVEF = 40%). There LV global hypokinesis. There is no  late gadolinium enhancement in the left ventricular myocardium. LV native T1 value 1033 ms (normal <1000 ms). LV ECV value 25% (normal <30%) LVEDV: 171 ml LVESV: 103 ml SV: 68 ml CO: 5.3 L/min Myocardial mass: 140 g 2. Normal right ventricular size, thickness and mildly reduced RV systolic function (RVEF = 44%). There are no regional wall motion abnormalities. 3. Severely dilated left atrial size. Moderately dilated right atrial size. 4. Normal size of the aortic root, ascending aorta and pulmonary artery. 5.  Mild mitral regurgitation, mild tricuspid regurgitation. 6.  Normal pericardium.  Small circumferential pericardial effusion. IMPRESSION: 1. Mild LV thickness, moderately reduced LV systolic function. LVEF 40%. 2.  No LGE or scar. 3.  No evidence for infiltrative disease. 4.  Mildly reduced RV systolic function. 5.  Severely dilated LA size, moderately dilated RA size. 6.  Small pericardial effusion. 7.  Findings suggest non-ischemic cardiomyopathy. Electronically Signed   By: Constancia Delton M.D.   On: 06/03/2023 09:44   MR CARDIAC VELOCITY FLOW MAP Result Date: 06/03/2023 CLINICAL DATA:  Evaluate for infiltrative disease EXAM: CARDIAC MRI TECHNIQUE: The patient was scanned on a 1.5 Tesla Siemens magnet. A dedicated cardiac coil was used. Functional imaging was done using Fiesta sequences. 2,3, and 4 chamber views were done to assess for RWMA's. Modified Simpson's rule using a short axis stack was used to calculate an ejection fraction on a dedicated work Research officer, trade union. The patient received 9 cc of Gadavist . After 10 minutes inversion recovery sequences were used to assess for infiltration and scar tissue. Velocity flow mapping performed in the ascending aorta and main pulmonary artery. CONTRAST:  9 cc  of Gadavist  FINDINGS: 1. Normal left ventricular size, mild LV thickness, moderately reduced LV systolic function (LVEF = 40%). There LV global hypokinesis. There is no late gadolinium enhancement in the left ventricular myocardium. LV native T1 value 1033 ms (normal <1000 ms). LV ECV value 25% (normal <30%) LVEDV: 171 ml LVESV: 103 ml SV: 68 ml CO: 5.3 L/min Myocardial mass: 140 g 2. Normal right ventricular size, thickness and mildly reduced RV systolic function (RVEF = 44%). There are no regional wall motion abnormalities. 3. Severely dilated left atrial size. Moderately dilated right atrial size. 4. Normal size of the aortic root, ascending aorta and pulmonary artery. 5.  Mild mitral regurgitation, mild tricuspid regurgitation. 6.  Normal pericardium.  Small circumferential pericardial effusion. IMPRESSION: 1. Mild LV thickness, moderately reduced LV systolic function. LVEF 40%. 2.  No LGE or scar. 3.  No evidence for infiltrative disease. 4.  Mildly reduced RV systolic function. 5.  Severely dilated LA size, moderately dilated RA size. 6.  Small pericardial effusion. 7.  Findings suggest non-ischemic cardiomyopathy. Electronically Signed   By: Constancia Delton M.D.    On: 06/03/2023 09:44   MR CARDIAC VELOCITY FLOW MAP Result Date: 06/03/2023 CLINICAL DATA:  Evaluate for infiltrative disease EXAM: CARDIAC MRI TECHNIQUE: The patient was scanned on a 1.5 Tesla Siemens magnet. A dedicated cardiac coil was used. Functional imaging was done using Fiesta sequences. 2,3, and 4 chamber views were done to assess for RWMA's. Modified Simpson's rule using a short axis stack was used to calculate an ejection fraction on a dedicated work Research officer, trade union. The patient received 9 cc of Gadavist . After 10 minutes inversion recovery sequences were used to assess for infiltration and scar tissue. Velocity flow mapping performed in the ascending aorta and main pulmonary artery. CONTRAST:  9 cc  of Gadavist   FINDINGS: 1. Normal left ventricular size, mild LV thickness, moderately reduced LV systolic function (LVEF = 40%). There LV global hypokinesis. There is no late gadolinium enhancement in the left ventricular myocardium. LV native T1 value 1033 ms (normal <1000 ms). LV ECV value 25% (normal <30%) LVEDV: 171 ml LVESV: 103 ml SV: 68 ml CO: 5.3 L/min Myocardial mass: 140 g 2. Normal right ventricular size, thickness and mildly reduced RV systolic function (RVEF = 44%). There are no regional wall motion abnormalities. 3. Severely dilated left atrial size. Moderately dilated right atrial size. 4. Normal size of the aortic root, ascending aorta and pulmonary artery. 5.  Mild mitral regurgitation, mild tricuspid regurgitation. 6.  Normal pericardium.  Small circumferential pericardial effusion. IMPRESSION: 1. Mild LV thickness, moderately reduced LV systolic function. LVEF 40%. 2.  No LGE or scar. 3.  No evidence for infiltrative disease. 4.  Mildly reduced RV systolic function. 5.  Severely dilated LA size, moderately dilated RA size. 6.  Small pericardial effusion. 7.  Findings suggest non-ischemic cardiomyopathy. Electronically Signed   By: Constancia Delton M.D.   On: 06/03/2023  09:44     ASSESSMENT & PLAN:   Essential thrombocythemia (HCC) # HIGH RISK Essential thrombocytosis-JAK-2 positive-November 2024 complicated  aortic/ dissection/bleeding-  # February 2025-she had pancytopenia while on Hydrea  1000 mg twice a day.  Currently on Hydrea  500 mg a day.  Counts still low but improving. Continue current dose of hydrea  of 500 mg/day [pill box]. stable.   # CAD- A-fib NOV 2024- intramural aortic hematoma [on Eliquis ]-currently off anticoagulation.  On aspirin ;  recommend hold off fish oil.?  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   # Poorly controlled BP/ intramural aortic hematoma- recommend close monitoring for now- stable.   # DISPOSITION: # follow up in  1 month-- MD; labs- cbc/cmp;LDH;-Dr.B    All questions were answered. The patient knows to call the clinic with any problems, questions or concerns.     Gwyn Leos, MD 06/19/2023 1:42 PM

## 2023-06-21 ENCOUNTER — Other Ambulatory Visit: Payer: Self-pay | Admitting: Internal Medicine

## 2023-06-21 DIAGNOSIS — D473 Essential (hemorrhagic) thrombocythemia: Secondary | ICD-10-CM

## 2023-06-23 NOTE — Progress Notes (Unsigned)
 Cardiology Clinic Note   Date: 06/25/2023 ID: Shianne Zeiser, DOB Feb 13, 1946, MRN [id number]  Primary Cardiologist:  Antionette Kirks, MD  Chief Complaint   Kristy Sullivan is a 78 y.o. female who presents to the clinic today for routine follow up.   Patient Profile   Kristy Sullivan is followed by Dr. Alvenia Aus for the history outlined below.      Past medical history significant for: Chronic HFmrEF. Echo 01/21/2023: EF 45%.  No RWMA.  Severe concentric LVH.  Mildly reduced RV function.  Mild RVH.  Moderate LAE. Severe RAE. Small pericardial effusion.  Moderate pleural effusion left lateral region.  Moderate TR.  Mild calcification/thickening of the aortic valve without stenosis.  Dilated IVC, RA pressure 15 mmHg.  No obvious aortic dissection noted on limited views.  There is some thickening around the abdominal aorta are possibly due to intramural hematoma. Cardiac MRI 06/03/2023: Mild LV thickness, moderately reduced LV systolic function, LVEF 40%.  No LGE or scar.  No evidence of infiltrative disease.  Mildly reduced RV systolic function, severely dilated LA size, moderately dilated RA size.  Small pericardial effusion.  Findings suggest nonischemic cardiomyopathy. PAF. 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 CT abdomen pelvis 01/19/2023: There is crescentic filling defect along the left/posterior wall of the lower thoracic aorta extending inferiorly into the abdomen up to the level of origin of inferior mesenteric artery. There is probable dissection flap extending into the proximal left renal artery which exhibit hypoattenuating area in the lower pole, favored to represent renal infarction. 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 Further imaging with CT angiography chest, abdomen and pelvis as per dissection protocol is recommended. There is a small right paramedian ventral hernia containing portion of fat and  ascitic fluid. No herniation of bowel loops. No bowel obstruction. Small left pleural effusion and partially seen pericardial effusion. There is a heterogeneous approximately 4.9 x 5.9 cm lesion in the anterior spleen, which is incompletely characterized on the current exam. CTA chest abdomen pelvis 01/19/2023:  Aneurysmal disease of the thoracic aorta beginning at the level of the distal arch and continuing into the descending thoracic aorta. Component of acute intramural hemorrhage is suspected beginning at roughly the aortic isthmus just beyond the left subclavian artery origin and continuing into the descending thoracic aorta. Maximum caliber of the distal arch including the estimated component of intramural hemorrhage is 3.9 cm. Maximum caliber of the descending thoracic aorta including intramural hemorrhage is approximately 3.7 cm. There also is a component of additional adjacent crescentic fluid along the left lateral aspect of the descending thoracic aorta which appears to be separate from the intramural hemorrhage. The presence of some crescentic fluid could be related to some extraluminal bleeding but is not large. This could also be related to reactive fluid. No overt intimal flap is present within the opacified lumen of the thoracic aorta. The intramural hemorrhage does not appear to affect the ascending thoracic aorta or great vessel origins at this time. As  suspected on the abdominal CT, the intramural hemorrhage of the descending thoracic aorta likely continues into the proximal abdominal aorta but likely ends before origins of the renal arteries. Intramural hemorrhage does not compromise patency of the celiac axis or SMA. Substantial cardiomegaly with small amount of pericardial fluid. Dilated central pulmonary arteries suggesting pulmonary arterial hypertension. Trace left pleural fluid with left lower lung atelectasis. Stable splenic enlargement. The previously noted anterior splenic lesion is  not well appreciated on the  arterial phase. 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 Enlarged uterus demonstrating degenerated and calcified fibroids. Stable small ventral hernia. Body wall anasarca without significant ascites or focal abnormal fluid collection. ADDENDUM: On further review, the origin of intramural hemorrhage is visible, especially on the sagittal reconstructions with a focal ulceration along the superior aspect of the aortic arch located approximately 1.3 cm distal to the left subclavian origin. This likely is the origin of the intramural hemorrhage which then progressed distally in the thoracic aorta and into the proximal abdominal aorta. CTA chest abdomen pelvis 01/21/2023:Unchanged contour and caliber of the descending thoracic aorta again with a posterior intramural hematoma arising just distal to the left subclavian artery origin. Maximum caliber of the proximal descending thoracic aorta 3.8 x 3.8 cm. Intramural hematoma extends into the upper abdominal aorta, to approximately the level of the celiac origin, maximum caliber of the vessel in this vicinity 3.3 x 3.1 cm. Gross cardiomegaly.  Unchanged small pericardial effusion. Moderate left, small right pleural effusions, increased in volume compared to prior examination. Severe anasarca and small volume ascites. Mild splenomegaly. Uterine fibroids. Hypertension. Stroke. Essential thrombocytosis. JAK2 mutation.  In summary, patient presented to [facility] ED on 09/30/2019 for lightheadedness and left sided weakness. Upon EMS arrival she was found to be in afib with HR in the 90s. She was hypertensive in the ED. Labs were notable for thrombocytosis with platelets of >1000. She continued to complain of mild left sided weakness and underwent MRI which showed several punctate acute infarcts in the right parietal lob affecting the cortical and subcortical brain consistent with microembolic infracts in the right MCA  branch vessel distribution.  She was admitted and started on metoprolol  and Eliquis . Echo showed EF 50-55% with mild LAE and moderate RAE. She was seen by hematology for thrombocytosis and placed on hydroxyurea . She was seen in follow up in August 2021 and was doing well. She was lost to follow up.   Patient presented to [facility] ED via EMS from home on 01/19/2023 for chest pain and abdominal pain. EMS reported EKG showed afib with multifocal PVCs. She reported not taking medications for afib or thrombocytosis.  Initial lab: Sodium 138, potassium 3.3, creatinine 1.04, BUN 15, WBC 50.3, hemoglobin 13, platelets 1296, BNP 597.5.  Troponin 81>> 63.  Chest x-ray with findings favoring congestive heart failure/pulmonary edema.  CT abdomen pelvis showed probable aortic dissection as detailed above. No evidence of DVT bilaterally on venous US .  Patient was started on IV esmolol  for elevated heart rate.  Patient was transferred to [facility].  Patient underwent thoracentesis for left pleural effusion.  She was evaluated by vascular surgery for intramural aortic hematoma and no surgery recommended.  Echo showed EF 45% with severe concentric LVH concerning for infiltrative cardiomyopathy (further details above).  Cardiology was consulted.  Patient was started on aspirin  and Plavix for 4 weeks then discontinue Plavix and start Eliquis .  She was restarted on hydroxyurea  for thrombocytosis.  She was unable to tolerate ARB and spironolactone secondary to acute kidney injury during hospitalization.  She was discharged on 01/27/2023.   Patient was seen in the office by Friddie Jetty, NP on 03/03/2023 for hospital follow-up. She was hypertensive and tachycardic at the time of her visit. EKG showed A-fib with RVR, 102 bpm. Patient and husband were confused about current medications. They brought in a box of medications with some bottles empty and patient was uncertain if she had finished them or had been taking them in  the  past. It did not appear patient had been taking Plavix at the time of her follow-up which she was taking aspirin . She understood that she was going to start Eliquis . Carvedilol  was increased to 12.5 mg twice daily.   Patient was last seen in the office by me on 03/20/2023 for close follow-up.  She was doing well with no complaints.  She reported no awareness of arrhythmia unless doing more vigorous activities.  She agreed to undergo MRI but was not interested in EP referral or cardioversion.  MRI April 2025 was consistent with nonischemic cardiomyopathy as detailed above.     History of Present Illness    Today, patient is accompanied by her husband. She reports she is doing well. Patient denies shortness of breath, dyspnea on exertion, lower extremity edema, orthopnea or PND. No chest pain, pressure, or tightness. No palpitations. She has no cardiac awareness of arrhythmia. She is active at home doing light to moderate household activities and resting as needed. She was previously working part time doing light housekeeping and would like to return to work. She said if she goes back to work she will work less hours than previously. Discussed results of cardiac MRI in detail and answered all questions.     ROS: All other systems reviewed and are otherwise negative except as noted in History of Present Illness.  EKGs/Labs Reviewed    EKG Interpretation Date/Time:  Thursday June 25 2023 09:33:08 EDT Ventricular Rate:  71 PR Interval:    QRS Duration:  80 QT Interval:  404 QTC Calculation: 439 R Axis:   -34  Text Interpretation: Atrial fibrillation Left axis deviation Possible Anterior infarct , age undetermined When compared with ECG of 20-Mar-2023 10:40, Borderline criteria for Anterior infarct are now Present Confirmed by Morey Ar [phone]) on 06/25/2023 9:45:40 AM   05/18/2023: ALT 33; AST 39; BUN 23; Creatinine 0.99; Potassium 3.9; Sodium 136   05/18/2023: Hemoglobin 8.6; WBC Count  2.6   01/19/2023: B Natriuretic Peptide 597.5    Risk Assessment/Calculations     CHA2DS2-VASc Score = 7   This indicates a 11.2% annual risk of stroke. The patient's score is based upon: CHF History: 1 HTN History: 1 Diabetes History: 0 Stroke History: 2 Vascular Disease History: 0 Age Score: 2 Gender Score: 1             Physical Exam    VS:  BP 138/80   Pulse 72   Ht 5\' 3"  (1.6 m)   Wt 151 lb 6.4 oz (68.7 kg)   LMP  (LMP Unknown)   SpO2 97%   BMI 26.82 kg/m  , BMI Body mass index is 26.82 kg/m.  GEN: Well nourished, well developed, in no acute distress. Neck: No JVD or carotid bruits. Cardiac: Irregularly irregular rhythm. No murmurs. No rubs or gallops.   Respiratory:  Respirations regular and unlabored. Clear to auscultation without rales, wheezing or rhonchi. GI: Soft, nontender, nondistended. Extremities: Radials/DP/PT 2+ and equal bilaterally. No clubbing or cyanosis. No edema.  Skin: Warm and dry, no rash. Neuro: Strength intact.  Assessment & Plan   Chronic HFmrEF/nonischemic cardiomyopathy.  Echo November 2024 showed EF 45%, severe concentric LVH concerning for infiltrative cardiomyopathy, mildly reduced RV function, mild RVH, small pericardial effusion.  Cardiac MRI demonstrated moderately reduced LV systolic function, LVEF 40%, findings suggestive of nonischemic cardiomyopathy as detailed in patient profile.  Patient denies shortness of breath, DOE, lower extremity edema, orthopnea or PND.  Euvolemic and well compensated on  exam.  -Continue carvedilol , losartan .   PAF Patient does not have cardiac awareness of arrhythmia. She reports compliance with medications. Denies spontaneous bleeding concerns. EKG demonstrates afib 71 bpm. She continues to decline EP referral and is adamant that she does not want to undergo cardioversion.  -Continue carvedilol , Eliquis . Appropriate Eliquis  dose.   Intramural aortic hematoma Discovered during hospital admission  November 2024.  Patient evaluated by [facility] who felt medical management would be most appropriate for patient and son was in agreement. -Continue to follow with [facility].   Hypertension BP today 138/80. Patient reports an occasional headache. No dizziness.  -Continue carvedilol , losartan .   Essential thrombocytosis/JAK2 positive Was being followed by hematology/oncology for this in 2021 and started on hydroxyurea .  Patient was lost to follow-up.  She stopped medication secondary to wanting to take a holistic approach.  Hospital admission in November 2024 demonstrated WBC 50.3, platelets 1296.  She started back on hydroxyurea .  She followed up with hematology and iron deficiency anemia is suspected as cause for thrombocytosis.   Patient reports she is taking her medications as prescribed. -Continue to follow with hematology/oncology.  -Will defer lab work to next week with heme/onc.   Disposition: Return in 3 months with Dr. Arida or sooner as needed.          Signed, Lonell Rives. Elica Almas, DNP, NP-C

## 2023-06-25 ENCOUNTER — Ambulatory Visit: Payer: Medicare PPO | Admitting: Cardiovascular Disease

## 2023-06-25 ENCOUNTER — Ambulatory Visit: Payer: Self-pay | Attending: Student | Admitting: Student

## 2023-06-25 ENCOUNTER — Encounter: Payer: Self-pay | Admitting: Student

## 2023-06-25 VITALS — BP 138/80 | HR 72 | Ht 63.0 in | Wt 151.4 lb

## 2023-06-25 DIAGNOSIS — D473 Essential (hemorrhagic) thrombocythemia: Secondary | ICD-10-CM

## 2023-06-25 DIAGNOSIS — I428 Other cardiomyopathies: Secondary | ICD-10-CM

## 2023-06-25 DIAGNOSIS — I4819 Other persistent atrial fibrillation: Secondary | ICD-10-CM

## 2023-06-25 DIAGNOSIS — I7100 Dissection of unspecified site of aorta: Secondary | ICD-10-CM

## 2023-06-25 DIAGNOSIS — I5022 Chronic systolic (congestive) heart failure: Secondary | ICD-10-CM

## 2023-06-25 DIAGNOSIS — D45 Polycythemia vera: Secondary | ICD-10-CM

## 2023-06-25 MED ORDER — CARVEDILOL 6.25 MG PO TABS: 6.2500 mg | ORAL_TABLET | Freq: Two times a day (BID) | ORAL | 3 refills | Status: DC

## 2023-06-25 NOTE — Patient Instructions (Signed)
 Follow-Up:   At [facility], you and your health needs are our priority.  As part of our continuing mission to provide you with exceptional heart care, our providers are all part of one team.  This team includes your primary Cardiologist (physician) and Advanced Practice Providers or APPs (Physician Assistants and Nurse Practitioners) who all work together to provide you with the care you need, when you need it.  Your next appointment:   3 month(s)  Provider:   You may see Antionette Kirks, MD  We recommend signing up for the patient portal called "MyChart".  Sign up information is provided on this After Visit Summary.  MyChart is used to connect with patients for Virtual Visits (Telemedicine).  Patients are able to view lab/test results, encounter notes, upcoming appointments, etc.  Non-urgent messages can be sent to your provider as well.   To learn more about what you can do with MyChart, go to [email or web address].

## 2023-06-30 ENCOUNTER — Encounter: Payer: Self-pay | Admitting: Internal Medicine

## 2023-06-30 ENCOUNTER — Inpatient Hospital Stay (HOSPITAL_BASED_OUTPATIENT_CLINIC_OR_DEPARTMENT_OTHER): Admitting: Internal Medicine

## 2023-06-30 ENCOUNTER — Inpatient Hospital Stay: Attending: Internal Medicine

## 2023-06-30 VITALS — BP 153/92 | HR 74 | Temp 96.9°F | Resp 16 | Ht 63.0 in | Wt 152.6 lb

## 2023-06-30 DIAGNOSIS — Z7964 Long term (current) use of myelosuppressive agent: Secondary | ICD-10-CM | POA: Insufficient documentation

## 2023-06-30 DIAGNOSIS — D473 Essential (hemorrhagic) thrombocythemia: Secondary | ICD-10-CM

## 2023-06-30 DIAGNOSIS — Z79899 Other long term (current) drug therapy: Secondary | ICD-10-CM | POA: Insufficient documentation

## 2023-06-30 LAB — CBC WITH DIFFERENTIAL (CANCER CENTER ONLY)
Abs Immature Granulocytes: 0.01 10*3/uL (ref 0.00–0.07)
Basophils Absolute: 0 10*3/uL (ref 0.0–0.1)
Basophils Relative: 0 %
Eosinophils Absolute: 0.1 10*3/uL (ref 0.0–0.5)
Eosinophils Relative: 3 %
HCT: 34.8 % — ABNORMAL LOW (ref 36.0–46.0)
Hemoglobin: 11.7 g/dL — ABNORMAL LOW (ref 12.0–15.0)
Immature Granulocytes: 0 %
Lymphocytes Relative: 34 %
Lymphs Abs: 1.3 10*3/uL (ref 0.7–4.0)
MCH: 37.7 pg — ABNORMAL HIGH (ref 26.0–34.0)
MCHC: 33.6 g/dL (ref 30.0–36.0)
MCV: 112.3 fL — ABNORMAL HIGH (ref 80.0–100.0)
Monocytes Absolute: 0.5 10*3/uL (ref 0.1–1.0)
Monocytes Relative: 14 %
Neutro Abs: 1.8 10*3/uL (ref 1.7–7.7)
Neutrophils Relative %: 49 %
Platelet Count: 183 10*3/uL (ref 150–400)
RBC: 3.1 MIL/uL — ABNORMAL LOW (ref 3.87–5.11)
RDW: 11.5 % (ref 11.5–15.5)
WBC Count: 3.7 10*3/uL — ABNORMAL LOW (ref 4.0–10.5)
nRBC: 0 % (ref 0.0–0.2)

## 2023-06-30 LAB — CMP (CANCER CENTER ONLY)
ALT: 23 U/L (ref 0–44)
AST: 25 U/L (ref 15–41)
Albumin: 4 g/dL (ref 3.5–5.0)
Alkaline Phosphatase: 84 U/L (ref 38–126)
Anion gap: 6 (ref 5–15)
BUN: 27 mg/dL — ABNORMAL HIGH (ref 8–23)
CO2: 26 mmol/L (ref 22–32)
Calcium: 9.8 mg/dL (ref 8.9–10.3)
Chloride: 106 mmol/L (ref 98–111)
Creatinine: 0.95 mg/dL (ref 0.44–1.00)
GFR, Estimated: >60 mL/min (ref >60)
Glucose, Bld: 96 mg/dL (ref 70–99)
Potassium: 4.2 mmol/L (ref 3.5–5.1)
Sodium: 138 mmol/L (ref 135–145)
Total Bilirubin: 1.5 mg/dL — ABNORMAL HIGH (ref 0.0–1.2)
Total Protein: 6.7 g/dL (ref 6.5–8.1)

## 2023-06-30 LAB — LACTATE DEHYDROGENASE: LDH: 183 U/L (ref 98–192)

## 2023-06-30 NOTE — Progress Notes (Signed)
 Fatigue: NO Bruising:  NO Nose bleeds: NO Blood in your stool/heavy periods: NO Abdominal pain: NO Burning pain in the feet/hands/arms/legs/ears or face: NO  Pt states her hydrea  went from 500 mg, 2 capsules bid to 1 capsule every day.  Refill eliquis , pended.  Since last visit she has had an MRI cardiac velocity 06/03/23 and MRI cardiac morphology 06/03/23.

## 2023-06-30 NOTE — Progress Notes (Signed)
 [facility] CONSULT NOTE  Patient Care Team: Patient, No Pcp Per as PCP - General (General Practice) Kristy Hamilton, MD as PCP - Cardiology (Cardiology) Gwyn Leos, MD as Consulting Physician (Internal Medicine)  CHIEF COMPLAINTS/PURPOSE OF CONSULTATION:  Essential thrombocytosis  #  Oncology History Overview Note   # July 2021-HIGH RISK Essential thrombocytosis-JAK-2 positive- / A.fib- TIA- currently on hydrea  ; AUG 9th 1000 mg BID.   #A. fib with RVR-July 2021; August 9-Eliquis /baby aspirin    # APRIL 2025- reduced to hydrea  500 mg one a day.   Essential thrombocythemia (HCC)  09/30/2019 Initial Diagnosis   Essential thrombocythemia (HCC)     HISTORY OF PRESENTING ILLNESS: Patient ambulating-Ambulating independently.  Alone.   Kristy Sullivan 78 y.o.  female  with a poorly controlled hypertension, Hx of aortic dissection. hyperlipidemia, A-fib on Eliquis , CVA-/Essential Thrombocytosis on hydrea  500mg /day   is here for a follow up.    Complains of ongoing mild fatigue. No fevers or chills. No bleeding.   Denies any blood in stools or black or stools.  Denies any worsening weakness.  No nausea no vomiting.  No diarrhea.  No skin rash. No further hospitalizations.   Review of Systems  Constitutional:  Negative for chills, diaphoresis, fever, malaise/fatigue and weight loss.  HENT:  Negative for nosebleeds and sore throat.   Eyes:  Negative for double vision.  Respiratory:  Negative for cough, hemoptysis, sputum production, shortness of breath and wheezing.   Cardiovascular:  Negative for chest pain, palpitations, orthopnea and leg swelling.  Gastrointestinal:  Negative for abdominal pain, blood in stool, constipation, diarrhea, heartburn, melena, nausea and vomiting.  Genitourinary:  Negative for dysuria, frequency and urgency.  Musculoskeletal:  Positive for back pain and joint pain.  Skin: Negative.  Negative for itching and rash.  Neurological:   Negative for dizziness, tingling, focal weakness, weakness and headaches.  Endo/Heme/Allergies:  Does not bruise/bleed easily.  Psychiatric/Behavioral:  Negative for depression. The patient is not nervous/anxious and does not have insomnia.      MEDICAL HISTORY:  Past Medical History:  Diagnosis Date   Embolic stroke (HCC)    a. 09/2019 MRI: several punctate acute infarcts in R parietal lobe affecting the cortical and subcortical brain consistent w/ micro embolic infarcts in R MCA branch vessel distribution.   Hyperlipidemia    Hypertension    Persistent atrial fibrillation (HCC)    a. 09/2019 Dx in setting of ED visit for weakness-->embolic stroke; b. CHA2DS2VASc = 6-->eliquis ; c. 09/2019 Echo: EF 50-55%, no rwma, mild LVH. Nl RV size/fxn. Mild LAE, mod RAE. Mild to mod MR.   Thrombocythemia, essential (HCC)    JAK2 positive    SURGICAL HISTORY: History reviewed. No pertinent surgical history.  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    Social Drivers of Health  Financial Resource Strain: Not on file  Food Insecurity: No Food Insecurity (01/19/2023)   Hunger Vital Sign    Worried About Running Out of Food in the Last Year: Never true    Ran Out of Food in the Last Year: Never true  Transportation Needs: No Transportation Needs (01/19/2023)   PRAPARE - Administrator, Civil Service (Medical): No    Lack of  Transportation (Non-Medical): No  Physical Activity: Not on file  Stress: Not on file  Social Connections: Not on file  Intimate Partner Violence: Not At Risk (01/19/2023)   Humiliation, Afraid, Rape, and Kick questionnaire    Fear of Current or Ex-Partner: No    Emotionally Abused: No    Physically Abused: No    Sexually Abused: No    FAMILY HISTORY: Family History  Problem Relation Age of Onset   High blood pressure Mother    Hypertension Brother     ALLERGIES:  has no known allergies.  MEDICATIONS:  Current Outpatient Medications  Medication Sig Dispense Refill   apixaban  (ELIQUIS ) 5 MG TABS tablet TAKE 1 TABLET BY MOUTH TWICE A DAY 60 tablet 5   atorvastatin  (LIPITOR) 40 MG tablet TAKE 1 TABLET BY MOUTH EVERY DAY 90 tablet 3   carvedilol  (COREG ) 6.25 MG tablet Take 1 tablet (6.25 mg total) by mouth 2 (two) times daily with a meal. 180 tablet 3   hydroxyurea  (HYDREA ) 500 MG capsule TAKE 2 CAPSULES BY MOUTH 2 (TWO) TIMES DAILY. MAY TAKE WITH FOOD TO MINIMIZE GI SIDE EFFECTS. (Patient taking differently: Take 500 mg by mouth daily.) 360 capsule 1   Iron-Vitamins (GERITOL PO) Take 1 tablet by mouth daily.     losartan  (COZAAR ) 25 MG tablet Take 1 tablet (25 mg total) by mouth daily. 90 tablet 3   No current facility-administered medications for this visit.      Aaron Aas  PHYSICAL EXAMINATION: ECOG PERFORMANCE STATUS: 1 - Symptomatic but completely ambulatory  Vitals:   06/30/23 1446 06/30/23 1503  BP: (!) 168/98 (!) 153/92  Pulse: 74   Resp: 16   Temp: (!) 96.9 F (36.1 C)   SpO2: 100%        Filed Weights   06/30/23 1446  Weight: 152 lb 9.6 oz (69.2 kg)    Physical Exam HENT:     Head: Normocephalic and atraumatic.     Mouth/Throat:     Pharynx: No oropharyngeal exudate.  Eyes:     Pupils: Pupils are equal, round, and reactive to light.  Cardiovascular:     Rate and Rhythm: Normal rate. Rhythm irregular.  Pulmonary:     Effort: No respiratory distress.      Breath sounds: No wheezing.  Abdominal:     General: Bowel sounds are normal. There is no distension.     Palpations: Abdomen is soft. There is no mass.     Tenderness: There is no abdominal tenderness. There is no guarding or rebound.  Musculoskeletal:        General: No tenderness. Normal range of motion.     Cervical back: Normal range of motion and neck supple.  Skin:    General: Skin is warm.  Neurological:     General: No focal deficit present.     Mental Status: She is alert and oriented to person, place, and time.  Psychiatric:        Mood and Affect: Affect normal.      LABORATORY DATA:  I have reviewed the data  as listed Lab Results  Component Value Date   WBC 3.7 (L) 06/30/2023   HGB 11.7 (L) 06/30/2023   HCT 34.8 (L) 06/30/2023   MCV 112.3 (H) 06/30/2023   PLT 183 06/30/2023   Recent Labs    01/23/23 0222 01/23/23 0223 01/23/23 1631 01/24/23 0705 04/28/23 1345 05/18/23 1445 06/30/23 1444  NA  --    < >  --    < > 137 136 138  K  --    < >  --    < > 4.4 3.9 4.2  CL  --    < >  --    < > 105 104 106  CO2  --    < >  --    < > 26 26 26   GLUCOSE  --    < >  --    < > 119* 111* 96  BUN  --    < >  --    < > 24* 23 27*  CREATININE  --    < >  --    < > 0.99 0.99 0.95  CALCIUM   --    < >  --    < > 9.5 9.3 9.8  GFRNONAA  --    < >  --    < > 58* 58* >60  PROT 6.1*  --  5.6*   < > 6.6 6.6 6.7  ALBUMIN  2.8*   < > 2.6*   < > 3.8 4.0 4.0  AST 50*  --  53*   < > 20 39 25  ALT 35  --  43   < > 15 33 23  ALKPHOS 120  --  128*   < > 84 100 84  BILITOT 1.6*  --  1.2*   < > 2.3* 2.3* 1.5*  BILIDIR 0.5*  --  0.3*  --   --   --   --   IBILI 1.1*  --  0.9  --   --   --   --    < > = values in this interval not displayed.    RADIOGRAPHIC STUDIES: I have personally reviewed the radiological images as listed and agreed with the findings in the report. MR CARDIAC MORPHOLOGY W WO CONTRAST Result Date: 06/03/2023 CLINICAL DATA:  Evaluate for infiltrative disease EXAM:  CARDIAC MRI TECHNIQUE: The patient was scanned on a 1.5 Tesla Siemens magnet. A dedicated cardiac coil was used. Functional imaging was done using Fiesta sequences. 2,3, and 4 chamber views were done to assess for RWMA's. Modified Simpson's rule using a short axis stack was used to calculate an ejection fraction on a dedicated work [employer], [employer]. The patient received 9 cc of Gadavist . After 10 minutes inversion recovery sequences were used to assess for infiltration and scar tissue. Velocity flow mapping performed in the ascending aorta and main pulmonary artery. CONTRAST:  9 cc  of Gadavist  FINDINGS: 1. Normal left ventricular size, mild LV thickness, moderately reduced LV systolic function (LVEF = 40%). There LV global hypokinesis. There is no late gadolinium enhancement in the left ventricular myocardium. LV native T1 value 1033 ms (normal <1000 ms). LV ECV value 25% (normal <30%) LVEDV: 171 ml LVESV: 103 ml SV: 68 ml CO: 5.3 L/min Myocardial mass: 140 g 2. Normal right ventricular size, thickness and mildly reduced RV systolic function (RVEF = 44%). There are no regional wall motion abnormalities. 3. Severely dilated left atrial size. Moderately dilated right atrial size.  4. Normal size of the aortic root, ascending aorta and pulmonary artery. 5.  Mild mitral regurgitation, mild tricuspid regurgitation. 6.  Normal pericardium.  Small circumferential pericardial effusion. IMPRESSION: 1. Mild LV thickness, moderately reduced LV systolic function. LVEF 40%. 2.  No LGE or scar. 3.  No evidence for infiltrative disease. 4.  Mildly reduced RV systolic function. 5.  Severely dilated LA size, moderately dilated RA size. 6.  Small pericardial effusion. 7.  Findings suggest non-ischemic cardiomyopathy. Electronically Signed   By: Constancia Delton M.D.   On: 06/03/2023 09:44   MR CARDIAC VELOCITY FLOW MAP Result Date: 06/03/2023 CLINICAL DATA:  Evaluate for infiltrative disease EXAM: CARDIAC MRI  TECHNIQUE: The patient was scanned on a 1.5 Tesla Siemens magnet. A dedicated cardiac coil was used. Functional imaging was done using Fiesta sequences. 2,3, and 4 chamber views were done to assess for RWMA's. Modified Simpson's rule using a short axis stack was used to calculate an ejection fraction on a dedicated work [employer] officer, [employer]. The patient received 9 cc of Gadavist . After 10 minutes inversion recovery sequences were used to assess for infiltration and scar tissue. Velocity flow mapping performed in the ascending aorta and main pulmonary artery. CONTRAST:  9 cc  of Gadavist  FINDINGS: 1. Normal left ventricular size, mild LV thickness, moderately reduced LV systolic function (LVEF = 40%). There LV global hypokinesis. There is no late gadolinium enhancement in the left ventricular myocardium. LV native T1 value 1033 ms (normal <1000 ms). LV ECV value 25% (normal <30%) LVEDV: 171 ml LVESV: 103 ml SV: 68 ml CO: 5.3 L/min Myocardial mass: 140 g 2. Normal right ventricular size, thickness and mildly reduced RV systolic function (RVEF = 44%). There are no regional wall motion abnormalities. 3. Severely dilated left atrial size. Moderately dilated right atrial size. 4. Normal size of the aortic root, ascending aorta and pulmonary artery. 5.  Mild mitral regurgitation, mild tricuspid regurgitation. 6.  Normal pericardium.  Small circumferential pericardial effusion. IMPRESSION: 1. Mild LV thickness, moderately reduced LV systolic function. LVEF 40%. 2.  No LGE or scar. 3.  No evidence for infiltrative disease. 4.  Mildly reduced RV systolic function. 5.  Severely dilated LA size, moderately dilated RA size. 6.  Small pericardial effusion. 7.  Findings suggest non-ischemic cardiomyopathy. Electronically Signed   By: Constancia Delton M.D.   On: 06/03/2023 09:44   MR CARDIAC VELOCITY FLOW MAP Result Date: 06/03/2023 CLINICAL DATA:  Evaluate for infiltrative disease EXAM: CARDIAC MRI TECHNIQUE: The  patient was scanned on a 1.5 Tesla Siemens magnet. A dedicated cardiac coil was used. Functional imaging was done using Fiesta sequences. 2,3, and 4 chamber views were done to assess for RWMA's. Modified Simpson's rule using a short axis stack was used to calculate an ejection fraction on a dedicated work [employer] officer, [employer]. The patient received 9 cc of Gadavist . After 10 minutes inversion recovery sequences were used to assess for infiltration and scar tissue. Velocity flow mapping performed in the ascending aorta and main pulmonary artery. CONTRAST:  9 cc  of Gadavist  FINDINGS: 1. Normal left ventricular size, mild LV thickness, moderately reduced LV systolic function (LVEF = 40%). There LV global hypokinesis. There is no late gadolinium enhancement in the left ventricular myocardium. LV native T1 value 1033 ms (normal <1000 ms). LV ECV value 25% (normal <30%) LVEDV: 171 ml LVESV: 103 ml SV: 68 ml CO: 5.3 L/min Myocardial mass: 140 g 2. Normal right ventricular size, thickness and mildly reduced  RV systolic function (RVEF = 44%). There are no regional wall motion abnormalities. 3. Severely dilated left atrial size. Moderately dilated right atrial size. 4. Normal size of the aortic root, ascending aorta and pulmonary artery. 5.  Mild mitral regurgitation, mild tricuspid regurgitation. 6.  Normal pericardium.  Small circumferential pericardial effusion. IMPRESSION: 1. Mild LV thickness, moderately reduced LV systolic function. LVEF 40%. 2.  No LGE or scar. 3.  No evidence for infiltrative disease. 4.  Mildly reduced RV systolic function. 5.  Severely dilated LA size, moderately dilated RA size. 6.  Small pericardial effusion. 7.  Findings suggest non-ischemic cardiomyopathy. Electronically Signed   By: Constancia Delton M.D.   On: 06/03/2023 09:44     ASSESSMENT & PLAN:   Essential thrombocythemia (HCC) # HIGH RISK Essential thrombocytosis-JAK-2 positive-November 2024 complicated  aortic/  dissection/bleeding-  # Currently on Hydrea  500 mg a day.  Counts still low but improving. Continue current dose of hydrea  of 500 mg/day [pill box]. stable.   # CAD- A-fib NOV 2024- intramural aortic hematoma [on Eliquis ]-currently on eliquis  on 5 mg BID; OFF aspirin ;  Cardiac amyloidosis as per the note-referred cardiology- stable. Discussed with cardiology re: refills.   # Poorly controlled BP/ intramural aortic hematoma- recommend close monitoring for now- stable.   # DISPOSITION: # follow up in 2  month-- MD; labs- cbc/cmp;LDH;-Dr.B     All questions were answered. The patient knows to call the clinic with any problems, questions or concerns.     Gwyn Leos, MD 06/30/2023 4:01 PM

## 2023-06-30 NOTE — Assessment & Plan Note (Addendum)
#   HIGH RISK Essential thrombocytosis-JAK-2 positive-November 2024 complicated  aortic/ dissection/bleeding-  # Currently on Hydrea  500 mg a day.  Counts still low but improving. Continue current dose of hydrea  of 500 mg/day [pill box]. stable.   # CAD- A-fib NOV 2024- intramural aortic hematoma [on Eliquis ]-currently on eliquis  on 5 mg BID; OFF aspirin ;  Cardiac amyloidosis as per the note-referred cardiology- stable. Discussed with cardiology re: refills.   # Poorly controlled BP/ intramural aortic hematoma- recommend close monitoring for now- stable.   # DISPOSITION: # follow up in 2  month-- MD; labs- cbc/cmp;LDH;-Dr.B

## 2023-07-01 ENCOUNTER — Telehealth: Payer: Self-pay | Admitting: Cardiovascular Disease

## 2023-07-01 ENCOUNTER — Other Ambulatory Visit: Payer: Self-pay | Admitting: Emergency Medicine

## 2023-07-01 MED ORDER — CARVEDILOL 12.5 MG PO TABS: 12.5000 mg | ORAL_TABLET | Freq: Two times a day (BID) | ORAL | 3 refills | Status: AC

## 2023-07-01 NOTE — Telephone Encounter (Signed)
 Hi,  Could you please advise. The patient was seen by D. Wittenborn on 06-25-2023. In her note, it states:   "Patient was seen in the office by Friddie Jetty, NP on 03/03/2023 for hospital follow-up. She was hypertensive and tachycardic at the time of her visit. EKG showed A-fib with RVR, 102 bpm. Patient and husband were confused about current medications. They brought in a box of medications with some bottles empty and patient was uncertain if she had finished them or had been taking them in the past. It did not appear patient had been taking Plavix at the time of her follow-up which she was taking aspirin . She understood that she was going to start Eliquis . Carvedilol  was increased to 12.5 mg twice daily."  Her note later states for her to continue Carvedilol , but I didn't see in her note that day to specifically decrease the dosage, but I seen that the dose was changed. I just want to make sure she is taking the right dosage of medication.   Thank you so much.

## 2023-07-01 NOTE — Telephone Encounter (Signed)
*  STAT* If patient is at the pharmacy, call can be transferred to refill team.   1. Which medications need to be refilled? (please list name of each medication and dose if known)   carvedilol  - patient had medication refilled on 06/29/23 at [facility] for carvedilol  6.25 MG. A previous refill of carvedilol  12.5 MG was done prior. Patient needs to know which dosage she is supposed to be taking now.   2. Would you like to learn more about the convenience, safety, & potential cost savings by using the [facility]? no     3. Are you open to using the [facility] (Type [facility]). no   4. Which pharmacy/location (including street and city if local pharmacy) is medication to be sent to?[facility], [facility] [address], [place]   5. Do they need a 30 day or 90 day supply? 90

## 2023-07-01 NOTE — Telephone Encounter (Signed)
 Stevens Eland consulted, Carvedilol  12.5 mg BID sent to pharmacy, pt called and reports that she just filled Eliquis  5 mg BID, and "has plenty" Losartan , pt verbalized new prescription and dosage of carvedilol 

## 2023-09-01 ENCOUNTER — Inpatient Hospital Stay: Admitting: Internal Medicine

## 2023-09-01 ENCOUNTER — Encounter: Payer: Self-pay | Admitting: Internal Medicine

## 2023-09-01 ENCOUNTER — Inpatient Hospital Stay: Attending: Internal Medicine

## 2023-09-01 VITALS — BP 105/67 | HR 56 | Temp 96.6°F | Resp 16 | Ht 63.0 in | Wt 156.9 lb

## 2023-09-01 DIAGNOSIS — Z7964 Long term (current) use of myelosuppressive agent: Secondary | ICD-10-CM | POA: Insufficient documentation

## 2023-09-01 DIAGNOSIS — Z79899 Other long term (current) drug therapy: Secondary | ICD-10-CM | POA: Diagnosis not present

## 2023-09-01 DIAGNOSIS — D473 Essential (hemorrhagic) thrombocythemia: Secondary | ICD-10-CM | POA: Insufficient documentation

## 2023-09-01 DIAGNOSIS — D75839 Thrombocytosis, unspecified: Secondary | ICD-10-CM | POA: Insufficient documentation

## 2023-09-01 DIAGNOSIS — I10 Essential (primary) hypertension: Secondary | ICD-10-CM | POA: Diagnosis not present

## 2023-09-01 LAB — CBC WITH DIFFERENTIAL (CANCER CENTER ONLY)
Abs Immature Granulocytes: 0.01 10*3/uL (ref 0.00–0.07)
Basophils Absolute: 0 10*3/uL (ref 0.0–0.1)
Basophils Relative: 0 %
Eosinophils Absolute: 0.1 10*3/uL (ref 0.0–0.5)
Eosinophils Relative: 2 %
HCT: 39.4 % (ref 36.0–46.0)
Hemoglobin: 13.2 g/dL (ref 12.0–15.0)
Immature Granulocytes: 0 %
Lymphocytes Relative: 23 %
Lymphs Abs: 1.3 10*3/uL (ref 0.7–4.0)
MCH: 34.7 pg — ABNORMAL HIGH (ref 26.0–34.0)
MCHC: 33.5 g/dL (ref 30.0–36.0)
MCV: 103.7 fL — ABNORMAL HIGH (ref 80.0–100.0)
Monocytes Absolute: 0.7 10*3/uL (ref 0.1–1.0)
Monocytes Relative: 12 %
Neutro Abs: 3.5 10*3/uL (ref 1.7–7.7)
Neutrophils Relative %: 63 %
Platelet Count: 267 10*3/uL (ref 150–400)
RBC: 3.8 MIL/uL — ABNORMAL LOW (ref 3.87–5.11)
RDW: 11.9 % (ref 11.5–15.5)
WBC Count: 5.6 10*3/uL (ref 4.0–10.5)
nRBC: 0 % (ref 0.0–0.2)

## 2023-09-01 LAB — LACTATE DEHYDROGENASE: LDH: 182 U/L (ref 98–192)

## 2023-09-01 LAB — CMP (CANCER CENTER ONLY)
ALT: 24 U/L (ref 0–44)
AST: 26 U/L (ref 15–41)
Albumin: 4.2 g/dL (ref 3.5–5.0)
Alkaline Phosphatase: 88 U/L (ref 38–126)
Anion gap: 6 (ref 5–15)
BUN: 23 mg/dL (ref 8–23)
CO2: 26 mmol/L (ref 22–32)
Calcium: 9.7 mg/dL (ref 8.9–10.3)
Chloride: 103 mmol/L (ref 98–111)
Creatinine: 0.88 mg/dL (ref 0.44–1.00)
GFR, Estimated: >60 mL/min (ref >60)
Glucose, Bld: 114 mg/dL — ABNORMAL HIGH (ref 70–99)
Potassium: 3.8 mmol/L (ref 3.5–5.1)
Sodium: 135 mmol/L (ref 135–145)
Total Bilirubin: 1.8 mg/dL — ABNORMAL HIGH (ref 0.0–1.2)
Total Protein: 7.3 g/dL (ref 6.5–8.1)

## 2023-09-01 NOTE — Progress Notes (Signed)
  [facility] CONSULT NOTE  Patient Care Team: Patient, No Pcp Per as PCP - General (General Practice) Darron Deatrice LABOR, MD as PCP - Cardiology (Cardiology) Rennie Cindy SAUNDERS, MD as Consulting Physician (Internal Medicine)  CHIEF COMPLAINTS/PURPOSE OF CONSULTATION:  Essential thrombocytosis  #  Oncology History Overview Note   # July 2021-HIGH RISK Essential thrombocytosis-JAK-2 positive- / A.fib- TIA- currently on hydrea  ; AUG 9th 1000 mg BID.   #A. fib with RVR-July 2021; August 9-Eliquis /baby aspirin    # APRIL 2025- reduced to hydrea  500 mg one a day.   Essential thrombocythemia (HCC)  09/30/2019 Initial Diagnosis   Essential thrombocythemia (HCC)     HISTORY OF PRESENTING ILLNESS: Patient ambulating-Ambulating independently.  Alone.   Kristy Sullivan 78 y.o.  female  with a poorly controlled hypertension, Hx of aortic dissection. hyperlipidemia, A-fib on Eliquis , CVA-/Essential Thrombocytosis on hydrea  500mg /day   is here for a follow up.    Complains of ongoing mild fatigue. No fevers or chills. No bleeding.   Denies any blood in stools or black or stools.  Denies any worsening weakness.  No nausea no vomiting.  No diarrhea.  No skin rash. No further hospitalizations.   Review of Systems  Constitutional:  Negative for chills, diaphoresis, fever, malaise/fatigue and weight loss.  HENT:  Negative for nosebleeds and sore throat.   Eyes:  Negative for double vision.  Respiratory:  Negative for cough, hemoptysis, sputum production, shortness of breath and wheezing.   Cardiovascular:  Negative for chest pain, palpitations, orthopnea and leg swelling.  Gastrointestinal:  Negative for abdominal pain, blood in stool, constipation, diarrhea, heartburn, melena, nausea and vomiting.  Genitourinary:  Negative for dysuria, frequency and urgency.  Musculoskeletal:  Positive for back pain and joint pain.  Skin: Negative.  Negative for itching and rash.  Neurological:   Negative for dizziness, tingling, focal weakness, weakness and headaches.  Endo/Heme/Allergies:  Does not bruise/bleed easily.  Psychiatric/Behavioral:  Negative for depression. The patient is not nervous/anxious and does not have insomnia.      MEDICAL HISTORY:  Past Medical History:  Diagnosis Date   Embolic stroke (HCC)    a. 09/2019 MRI: several punctate acute infarcts in R parietal lobe affecting the cortical and subcortical brain consistent w/ micro embolic infarcts in R MCA branch vessel distribution.   Hyperlipidemia    Hypertension    Persistent atrial fibrillation (HCC)    a. 09/2019 Dx in setting of ED visit for weakness-->embolic stroke; b. CHA2DS2VASc = 6-->eliquis ; c. 09/2019 Echo: EF 50-55%, no rwma, mild LVH. Nl RV size/fxn. Mild LAE, mod RAE. Mild to mod MR.   Thrombocythemia, essential (HCC)    JAK2 positive    SURGICAL HISTORY: History reviewed. No pertinent surgical history.  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    Social Drivers of Health  Financial Resource Strain: Not on file  Food Insecurity: No Food Insecurity (01/19/2023)   Hunger Vital Sign    Worried About Running Out of Food in the Last Year: Never true    Ran Out of Food in the Last Year: Never true  Transportation Needs: No Transportation Needs (01/19/2023)   PRAPARE - Administrator, Civil Service (Medical): No    Lack of  Transportation (Non-Medical): No  Physical Activity: Not on file  Stress: Not on file  Social Connections: Not on file  Intimate Partner Violence: Not At Risk (01/19/2023)   Humiliation, Afraid, Rape, and Kick questionnaire    Fear of Current or Ex-Partner: No    Emotionally Abused: No    Physically Abused: No    Sexually Abused: No    FAMILY HISTORY: Family History  Problem Relation Age of Onset   High blood pressure Mother    Hypertension Brother     ALLERGIES:  has no known allergies.  MEDICATIONS:  Current Outpatient Medications  Medication Sig Dispense Refill   apixaban  (ELIQUIS ) 5 MG TABS tablet TAKE 1 TABLET BY MOUTH TWICE A DAY 60 tablet 5   atorvastatin  (LIPITOR) 40 MG tablet TAKE 1 TABLET BY MOUTH EVERY DAY 90 tablet 3   carvedilol  (COREG ) 12.5 MG tablet Take 1 tablet (12.5 mg total) by mouth 2 (two) times daily with a meal. 180 tablet 3   hydroxyurea  (HYDREA ) 500 MG capsule TAKE 2 CAPSULES BY MOUTH 2 (TWO) TIMES DAILY. MAY TAKE WITH FOOD TO MINIMIZE GI SIDE EFFECTS. (Patient taking differently: Take 500 mg by mouth daily.) 360 capsule 1   Iron-Vitamins (GERITOL PO) Take 1 tablet by mouth daily.     losartan  (COZAAR ) 25 MG tablet Take 1 tablet (25 mg total) by mouth daily. 90 tablet 3   No current facility-administered medications for this visit.      [name]  PHYSICAL EXAMINATION: ECOG PERFORMANCE STATUS: 1 - Symptomatic but completely ambulatory  Vitals:   09/01/23 1518  BP: 105/67  Pulse: (!) 56  Resp: 16  Temp: (!) 96.6 F (35.9 C)  SpO2: 100%       Filed Weights   09/01/23 1518  Weight: 156 lb 14.4 oz (71.2 kg)    Physical Exam HENT:     Head: Normocephalic and atraumatic.     Mouth/Throat:     Pharynx: No oropharyngeal exudate.   Eyes:     Pupils: Pupils are equal, round, and reactive to light.    Cardiovascular:     Rate and Rhythm: Normal rate. Rhythm irregular.  Pulmonary:     Effort: No respiratory distress.     Breath sounds: No  wheezing.  Abdominal:     General: Bowel sounds are normal. There is no distension.     Palpations: Abdomen is soft. There is no mass.     Tenderness: There is no abdominal tenderness. There is no guarding or rebound.   Musculoskeletal:        General: No tenderness. Normal range of motion.     Cervical back: Normal range of motion and neck supple.   Skin:    General: Skin is warm.   Neurological:     General: No focal deficit present.     Mental Status: She is alert and oriented to person, place, and time.   Psychiatric:        Mood and Affect: Affect normal.      LABORATORY DATA:  I have reviewed the data as  listed Lab Results  Component Value Date   WBC 5.6 09/01/2023   HGB 13.2 09/01/2023   HCT 39.4 09/01/2023   MCV 103.7 (H) 09/01/2023   PLT 267 09/01/2023   Recent Labs    01/23/23 0222 01/23/23 0223 01/23/23 1631 01/24/23 0705 05/18/23 1445 06/30/23 1444 09/01/23 1517  NA  --    < >  --    < > 136 138 135  K  --    < >  --    < > 3.9 4.2 3.8  CL  --    < >  --    < > 104 106 103  CO2  --    < >  --    < > 26 26 26   GLUCOSE  --    < >  --    < > 111* 96 114*  BUN  --    < >  --    < > 23 27* 23  CREATININE  --    < >  --    < > 0.99 0.95 0.88  CALCIUM   --    < >  --    < > 9.3 9.8 9.7  GFRNONAA  --    < >  --    < > 58* >60 >60  PROT 6.1*  --  5.6*   < > 6.6 6.7 7.3  ALBUMIN  2.8*   < > 2.6*   < > 4.0 4.0 4.2  AST 50*  --  53*   < > 39 25 26  ALT 35  --  43   < > 33 23 24  ALKPHOS 120  --  128*   < > 100 84 88  BILITOT 1.6*  --  1.2*   < > 2.3* 1.5* 1.8*  BILIDIR 0.5*  --  0.3*  --   --   --   --   IBILI 1.1*  --  0.9  --   --   --   --    < > = values in this interval not displayed.    RADIOGRAPHIC STUDIES: I have personally reviewed the radiological images as listed and agreed with the findings in the report. No results found.    ASSESSMENT & PLAN:   Essential thrombocythemia (HCC) # HIGH RISK Essential thrombocytosis-JAK-2  positive-November 2024 complicated  aortic/ dissection/bleeding-  # Currently on Hydrea  500 mg a day.  Counts still low but improving. Continue current dose of hydrea  of 500 mg/day [pill box]. stable.   # CAD- A-fib NOV 2024- intramural aortic hematoma [on Eliquis ]-currently on eliquis  on 5 mg BID; OFF aspirin ;  Cardiac amyloidosis as per the note-referred cardiology- stable. Discussed with cardiology re: refills.   # Poorly controlled BP/ intramural aortic hematoma- recommend close monitoring for now- stable.   # DISPOSITION: # follow up in 2  month-- MD; labs- cbc/cmp;LDH;-Dr.B     All questions were answered. The patient knows to call the clinic with any problems, questions or concerns.     Cindy JONELLE Joe, MD 09/01/2023 3:54 PM

## 2023-09-01 NOTE — Progress Notes (Signed)
 Fatigue: SOMETIMES Bruising: NO Nose bleeds: NO  Blood in your stool/heavy periods: NO Abdominal pain: NO  Burning pain in the feet/hands/arms/legs/ears or face: NO

## 2023-09-01 NOTE — Assessment & Plan Note (Addendum)
#   HIGH RISK Essential thrombocytosis-JAK-2 positive-November 2024 complicated  aortic/ dissection/bleeding-  # Currently on Hydrea  500 mg a day.  Counts improved Continue current dose of hydrea  of 500 mg/day [pill box]. stable.   # CAD- A-fib NOV 2024- intramural aortic hematoma [on Eliquis ]-currently on eliquis  on 5 mg BID; OFF aspirin ;  Cardiac amyloidosis as per the note-referred cardiology-  stabe.   # Poorly controlled BP/ intramural aortic hematoma- recommend close monitoring for now- stable.   # DISPOSITION: # follow up in 3  month-- MD; labs- cbc/cmp;LDH;-Dr.B

## 2023-09-30 ENCOUNTER — Ambulatory Visit: Attending: Cardiovascular Disease | Admitting: Cardiovascular Disease

## 2023-09-30 ENCOUNTER — Encounter: Payer: Self-pay | Admitting: Cardiovascular Disease

## 2023-09-30 VITALS — BP 140/88 | HR 74 | Ht 63.0 in | Wt 160.2 lb

## 2023-09-30 DIAGNOSIS — E785 Hyperlipidemia, unspecified: Secondary | ICD-10-CM | POA: Diagnosis not present

## 2023-09-30 DIAGNOSIS — I5022 Chronic systolic (congestive) heart failure: Secondary | ICD-10-CM

## 2023-09-30 DIAGNOSIS — I428 Other cardiomyopathies: Secondary | ICD-10-CM

## 2023-09-30 DIAGNOSIS — I4821 Permanent atrial fibrillation: Secondary | ICD-10-CM | POA: Diagnosis not present

## 2023-09-30 MED ORDER — LOSARTAN POTASSIUM 50 MG PO TABS: 50.0000 mg | ORAL_TABLET | Freq: Every day | ORAL | 3 refills | Status: AC

## 2023-09-30 NOTE — Progress Notes (Signed)
 Cardiology Office Note   Date:  09/30/2023   ID:  Kristy Sullivan, DOB 15-Jun-1945, MRN [id number]  PCP:  Patient, No Pcp Per  Cardiologist:   Deatrice Cage, MD   Chief Complaint  Patient presents with   3 month follow     Patient c/o chest pain with occasional irregular heart beats.       History of Present Illness: Kristy Sullivan is a 78 y.o. female who presents for a follow-up visit regarding permanent atrial fibrillation and chronic systolic heart failure due to suspected nonischemic cardiomyopathy. She was hospitalized in 2021 with dizziness and left-sided weakness.  She was found to be in atrial fibrillation with controlled ventricular rate.  MRI of the brain confirmed acute infarcts which were felt to be embolic.  Echocardiogram showed an EF of 50 to 55%.  She was also diagnosed with essential thrombocytosis at that time and was placed on hydroxyurea .  She was anticoagulated with Eliquis . She was hospitalized subsequently due to aortic intramural hematoma at the level of the aortic arch extending down to the renal arteries.  She was not taking anticoagulation or hydroxyurea  at that time.  Hydroxyurea  was resumed and she was treated with antiplatelet medications and transition back to Eliquis .  She had an echocardiogram done which showed an EF of 45%.  There was evidence of severe left ventricular hypertrophy and concerns about infiltrative cardiomyopathy.  She underwent cardiac MRI in April of this year which showed mild left ventricular hypertrophy with an EF of 40% and no evidence of infiltrative disease.  Left atrium was severely dilated with small pericardial effusions.  Findings were consistent with nonischemic cardiomyopathy.  She has been doing extremely well with no chest pain, shortness of breath or palpitations.  Platelet count normalized with hydroxyurea  and she has been taking anticoagulation with Eliquis .  Past Medical History:  Diagnosis Date   Embolic stroke  (HCC)    a. 09/2019 MRI: several punctate acute infarcts in R parietal lobe affecting the cortical and subcortical brain consistent w/ micro embolic infarcts in R MCA branch vessel distribution.   Hyperlipidemia    Hypertension    Persistent atrial fibrillation (HCC)    a. 09/2019 Dx in setting of ED visit for weakness-->embolic stroke; b. CHA2DS2VASc = 6-->eliquis ; c. 09/2019 Echo: EF 50-55%, no rwma, mild LVH. Nl RV size/fxn. Mild LAE, mod RAE. Mild to mod MR.   Thrombocythemia, essential (HCC)    JAK2 positive    History reviewed. No pertinent surgical history.   Current Outpatient Medications  Medication Sig Dispense Refill   apixaban  (ELIQUIS ) 5 MG TABS tablet TAKE 1 TABLET BY MOUTH TWICE A DAY 60 tablet 5   atorvastatin  (LIPITOR) 40 MG tablet TAKE 1 TABLET BY MOUTH EVERY DAY 90 tablet 3   carvedilol  (COREG ) 12.5 MG tablet Take 1 tablet (12.5 mg total) by mouth 2 (two) times daily with a meal. 180 tablet 3   hydroxyurea  (HYDREA ) 500 MG capsule TAKE 2 CAPSULES BY MOUTH 2 (TWO) TIMES DAILY. MAY TAKE WITH FOOD TO MINIMIZE GI SIDE EFFECTS. (Patient taking differently: Take 500 mg by mouth daily.) 360 capsule 1   Iron-Vitamins (GERITOL PO) Take 1 tablet by mouth daily.     losartan  (COZAAR ) 25 MG tablet Take 1 tablet (25 mg total) by mouth daily. 90 tablet 3   No current facility-administered medications for this visit.    Allergies:   Patient has no known allergies.    Social History:  The patient  reports that  she has never smoked. She has never used smokeless tobacco. She reports that she does not drink alcohol and does not use drugs.   Family History:  The patient's family history includes High blood pressure in her mother; Hypertension in her brother.    ROS:  Please see the history of present illness.   Otherwise, review of systems are positive for none.   All other systems are reviewed and negative.    PHYSICAL EXAM: VS:  BP (!) 140/88 (BP Location: Left Arm, Patient  Position: Sitting, Cuff Size: Normal)   Pulse 74   Ht 5' 3 (1.6 m)   Wt 160 lb 4 oz (72.7 kg)   LMP  (LMP Unknown)   SpO2 97%   BMI 28.39 kg/m  , BMI Body mass index is 28.39 kg/m. GEN: Well nourished, well developed, in no acute distress  HEENT: normal  Neck: no JVD, carotid bruits, or masses Cardiac: Irregularly irregular; no murmurs, rubs, or gallops,no edema  Respiratory:  clear to auscultation bilaterally, normal work of breathing GI: soft, nontender, nondistended, + BS MS: no deformity or atrophy  Skin: warm and dry, no rash Neuro:  Strength and sensation are intact Psych: euthymic mood, full affect   EKG:  EKG is ordered today. The ekg ordered today demonstrates : Atrial fibrillation Left axis deviation Inferior infarct , age undetermined Cannot rule out Anterior infarct (cited on or before 25-Jun-2023) When compared with ECG of 25-Jun-2023 09:33, No significant change was found    Recent Labs: 01/19/2023: B Natriuretic Peptide 597.5 01/27/2023: Magnesium 2.1 09/01/2023: ALT 24; BUN 23; Creatinine 0.88; Hemoglobin 13.2; Platelet Count 267; Potassium 3.8; Sodium 135    Lipid Panel    Component Value Date/Time   CHOL 154 01/20/2023 0257   TRIG 60 01/20/2023 0257   HDL 53 01/20/2023 0257   CHOLHDL 2.9 01/20/2023 0257   VLDL 12 01/20/2023 0257   LDLCALC 89 01/20/2023 0257      Wt Readings from Last 3 Encounters:  09/30/23 160 lb 4 oz (72.7 kg)  09/01/23 156 lb 14.4 oz (71.2 kg)  06/30/23 152 lb 9.6 oz (69.2 kg)           No data to display            ASSESSMENT AND PLAN:  1.  Permanent atrial fibrillation: She is overall asymptomatic.  Recommend rate control.  Continue treatment with carvedilol .  Continue long-term anticoagulation with Eliquis  5 mg twice daily.  No plans for rhythm control.  2.  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  [place]  heart association class II.  No evidence of  volume overload.  Continue carvedilol .  I increased losartan  to 50 mg daily given elevated blood pressure.  3.  Essential thrombocytosis: Platelet count normalized with hydroxyurea .  4.  Hyperlipidemia: Currently on atorvastatin  40 mg daily.    Disposition:   FU in 6 months.  Signed,  Deatrice Cage, MD  09/30/2023 9:51 AM    [facility]

## 2023-09-30 NOTE — Patient Instructions (Signed)
 Medication Instructions:   Your physician recommends the following medication changes.  INCREASE: Losartan  (COZAAR ) from 25 mg to 50 mg by mouth daily  Continue all other medications as prescribed.   *If you need a refill on your cardiac medications before your next appointment, please call your pharmacy*  Lab Work:  No labs ordered today   If you have labs (blood work) drawn today and your tests are completely normal, you will receive your results only by: MyChart Message (if you have MyChart) OR A paper copy in the mail If you have any lab test that is abnormal or we need to change your treatment, we will call you to review the results.  Testing/Procedures:  No test ordered today   Follow-Up: At [facility], you and your health needs are our priority.  As part of our continuing mission to provide you with exceptional heart care, our providers are all part of one team.  This team includes your primary Cardiologist (physician) and Advanced Practice Providers or APPs (Physician Assistants and Nurse Practitioners) who all work together to provide you with the care you need, when you need it.  Your next appointment:    6 month(s)  Provider:    You may see Deatrice Cage, MD or one of the following Advanced Practice Providers on your designated Care Team:   Lonni Meager, NP Lesley Maffucci, PA-C Bernardino Bring, PA-C Cadence Leona, PA-C Tylene Lunch, NP Barnie Hila, NP

## 2023-10-21 ENCOUNTER — Other Ambulatory Visit: Payer: Self-pay | Admitting: Cardiovascular Disease

## 2023-10-21 DIAGNOSIS — I4819 Other persistent atrial fibrillation: Secondary | ICD-10-CM

## 2023-10-21 NOTE — Telephone Encounter (Signed)
 Eliquis  5mg  refill request received. Patient is 78 years old, weight-72.7kg, Crea-0.88 on 09/01/23, Diagnosis-Afib, and last seen by Dr. Darron on 09/30/23. Dose is appropriate based on dosing criteria. Will send in refill to requested pharmacy.

## 2023-12-02 ENCOUNTER — Encounter: Payer: Self-pay | Admitting: Internal Medicine

## 2023-12-02 ENCOUNTER — Inpatient Hospital Stay: Admitting: Internal Medicine

## 2023-12-02 ENCOUNTER — Inpatient Hospital Stay: Attending: Internal Medicine

## 2023-12-02 VITALS — BP 130/100 | HR 80 | Temp 97.1°F | Ht 63.0 in | Wt 165.0 lb

## 2023-12-02 DIAGNOSIS — Z79899 Other long term (current) drug therapy: Secondary | ICD-10-CM | POA: Diagnosis not present

## 2023-12-02 DIAGNOSIS — D473 Essential (hemorrhagic) thrombocythemia: Secondary | ICD-10-CM

## 2023-12-02 DIAGNOSIS — Z7901 Long term (current) use of anticoagulants: Secondary | ICD-10-CM | POA: Diagnosis not present

## 2023-12-02 DIAGNOSIS — Z7964 Long term (current) use of myelosuppressive agent: Secondary | ICD-10-CM | POA: Diagnosis not present

## 2023-12-02 LAB — CBC WITH DIFFERENTIAL (CANCER CENTER ONLY)
Abs Immature Granulocytes: 0.02 K/uL (ref 0.00–0.07)
Basophils Absolute: 0 K/uL (ref 0.0–0.1)
Basophils Relative: 0 %
Eosinophils Absolute: 0.1 K/uL (ref 0.0–0.5)
Eosinophils Relative: 2 %
HCT: 39.5 % (ref 36.0–46.0)
Hemoglobin: 13.4 g/dL (ref 12.0–15.0)
Immature Granulocytes: 0 %
Lymphocytes Relative: 23 %
Lymphs Abs: 1.3 K/uL (ref 0.7–4.0)
MCH: 34.6 pg — ABNORMAL HIGH (ref 26.0–34.0)
MCHC: 33.9 g/dL (ref 30.0–36.0)
MCV: 102.1 fL — ABNORMAL HIGH (ref 80.0–100.0)
Monocytes Absolute: 0.6 K/uL (ref 0.1–1.0)
Monocytes Relative: 11 %
Neutro Abs: 3.6 K/uL (ref 1.7–7.7)
Neutrophils Relative %: 64 %
Platelet Count: 380 K/uL (ref 150–400)
RBC: 3.87 MIL/uL (ref 3.87–5.11)
RDW: 12.2 % (ref 11.5–15.5)
WBC Count: 5.6 K/uL (ref 4.0–10.5)
nRBC: 0 % (ref 0.0–0.2)

## 2023-12-02 LAB — CMP (CANCER CENTER ONLY)
ALT: 21 U/L (ref 0–44)
AST: 26 U/L (ref 15–41)
Albumin: 4 g/dL (ref 3.5–5.0)
Alkaline Phosphatase: 82 U/L (ref 38–126)
Anion gap: 5 (ref 5–15)
BUN: 19 mg/dL (ref 8–23)
CO2: 26 mmol/L (ref 22–32)
Calcium: 9.7 mg/dL (ref 8.9–10.3)
Chloride: 105 mmol/L (ref 98–111)
Creatinine: 1.04 mg/dL — ABNORMAL HIGH (ref 0.44–1.00)
GFR, Estimated: 54 mL/min — ABNORMAL LOW (ref >60)
Glucose, Bld: 100 mg/dL — ABNORMAL HIGH (ref 70–99)
Potassium: 3.9 mmol/L (ref 3.5–5.1)
Sodium: 136 mmol/L (ref 135–145)
Total Bilirubin: 2 mg/dL — ABNORMAL HIGH (ref 0.0–1.2)
Total Protein: 6.5 g/dL (ref 6.5–8.1)

## 2023-12-02 LAB — LACTATE DEHYDROGENASE: LDH: 190 U/L (ref 98–192)

## 2023-12-02 NOTE — Assessment & Plan Note (Signed)
#   HIGH RISK Essential thrombocytosis-JAK-2 positive-November 2024 complicated  aortic/ dissection/bleeding-  # Currently on Hydrea  500 mg a day.  Counts improved Continue current dose of hydrea  of 500 mg/day [pill box]. stable. Declines- any refills for now- will call for refills.   # CAD- A-fib NOV 2024- intramural aortic hematoma [on Eliquis ]-currently on eliquis  on 5 mg BID; OFF aspirin ;  Cardiac amyloidosis as per the note-referred cardiology-  stabe.   # Poorly controlled BP/ intramural aortic hematoma- recommend close monitoring for now- stable.   # DISPOSITION: # repeat BP # follow up in 6 month-- MD; labs- cbc/cmp;LDH;-Dr.B

## 2023-12-02 NOTE — Progress Notes (Signed)
 [name] CONSULT NOTE  Patient Care Team: Patient, No Pcp Per as PCP - General (General Practice) Darron Deatrice LABOR, MD as PCP - Cardiology (Cardiology) Rennie Cindy SAUNDERS, MD as Consulting Physician (Internal Medicine)  CHIEF COMPLAINTS/PURPOSE OF CONSULTATION:  Essential thrombocytosis  #  Oncology History Overview Note   # July 2021-HIGH RISK Essential thrombocytosis-JAK-2 positive- / A.fib- TIA- currently on hydrea  ; AUG 9th 1000 mg BID.   #A. fib with RVR-July 2021; August 9-Eliquis /baby aspirin    # APRIL 2025- reduced to hydrea  500 mg one a day.   Essential thrombocythemia (HCC)  09/30/2019 Initial Diagnosis   Essential thrombocythemia (HCC)     HISTORY OF PRESENTING ILLNESS: Patient ambulating-Ambulating independently.  Alone.   Kristy Sullivan 78 y.o.  female  with a poorly controlled hypertension, Hx of aortic dissection. hyperlipidemia, A-fib on Eliquis , CVA-/Essential Thrombocytosis on hydrea  500mg /day   is here for a follow up.   Energy is up and down. Sometimes body aches from head to toe, then it goes away. She states walking and ROM exercises help.  Bowels okay. Denies headaches.  Has soreness on left side of face. Takes tylenol  when needed. Denies bruising or bleeding from anywhere.    Review of Systems  Constitutional:  Negative for chills, diaphoresis, fever, malaise/fatigue and weight loss.  HENT:  Negative for nosebleeds and sore throat.   Eyes:  Negative for double vision.  Respiratory:  Negative for cough, hemoptysis, sputum production, shortness of breath and wheezing.   Cardiovascular:  Negative for chest pain, palpitations, orthopnea and leg swelling.  Gastrointestinal:  Negative for abdominal pain, blood in stool, constipation, diarrhea, heartburn, melena, nausea and vomiting.  Genitourinary:  Negative for dysuria, frequency and urgency.  Musculoskeletal:  Positive for back pain and joint pain.  Skin: Negative.  Negative for  itching and rash.  Neurological:  Negative for dizziness, tingling, focal weakness, weakness and headaches.  Endo/Heme/Allergies:  Does not bruise/bleed easily.  Psychiatric/Behavioral:  Negative for depression. The patient is not nervous/anxious and does not have insomnia.      MEDICAL HISTORY:  Past Medical History:  Diagnosis Date   Embolic stroke (HCC)    a. 09/2019 MRI: several punctate acute infarcts in R parietal lobe affecting the cortical and subcortical brain consistent w/ micro embolic infarcts in R MCA branch vessel distribution.   Hyperlipidemia    Hypertension    Persistent atrial fibrillation (HCC)    a. 09/2019 Dx in setting of ED visit for weakness-->embolic stroke; b. CHA2DS2VASc = 6-->eliquis ; c. 09/2019 Echo: EF 50-55%, no rwma, mild LVH. Nl RV size/fxn. Mild LAE, mod RAE. Mild to mod MR.   Thrombocythemia, essential (HCC)    JAK2 positive    SURGICAL HISTORY: History reviewed. No pertinent surgical history.  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    Social Drivers  of Health   Financial Resource Strain: Not on file  Food Insecurity: No Food Insecurity (01/19/2023)   Hunger Vital Sign    Worried About Running Out of Food in the Last Year: Never true    Ran Out of Food in the Last Year: Never true  Transportation Needs: No Transportation Needs (01/19/2023)   PRAPARE - Scientist, research (physical sciences) (Medical): No    Lack of Transportation (Non-Medical): No  Physical Activity: Not on file  Stress: Not on file  Social Connections: Not on file  Intimate Partner Violence: Not At Risk (01/19/2023)   Humiliation, Afraid, Rape, and Kick questionnaire    Fear of Current or Ex-Partner: No    Emotionally Abused: No    Physically Abused: No    Sexually Abused: No    FAMILY HISTORY: Family History  Problem Relation Age of Onset   High blood pressure Mother    Hypertension Brother     ALLERGIES:  has no known allergies.  MEDICATIONS:  Current Outpatient Medications  Medication Sig Dispense Refill   atorvastatin  (LIPITOR) 40 MG tablet TAKE 1 TABLET BY MOUTH EVERY DAY 90 tablet 3   carvedilol  (COREG ) 12.5 MG tablet Take 1 tablet (12.5 mg total) by mouth 2 (two) times daily with a meal. 180 tablet 3   ELIQUIS  5 MG TABS tablet TAKE 1 TABLET BY MOUTH TWICE A DAY 60 tablet 5   hydroxyurea  (HYDREA ) 500 MG capsule Take 500 mg by mouth daily. May take with food to minimize GI side effects.     Iron-Vitamins (GERITOL PO) Take 1 tablet by mouth daily.     losartan  (COZAAR ) 50 MG tablet Take 1 tablet (50 mg total) by mouth daily. 90 tablet 3   No current facility-administered medications for this visit.      [name]  PHYSICAL EXAMINATION: ECOG PERFORMANCE STATUS: 1 - Symptomatic but completely ambulatory  Vitals:   12/02/23 1438 12/02/23 1530  BP: (!) 140/110 (!) 130/100  Pulse: 80   Temp: (!) 97.1 F (36.2 C)   SpO2: 98%        Filed Weights   12/02/23 1438  Weight: 165 lb (74.8 kg)    Physical Exam HENT:     Head: Normocephalic and atraumatic.     Mouth/Throat:     Pharynx: No oropharyngeal exudate.  Eyes:     Pupils: Pupils are equal, round, and reactive to light.  Cardiovascular:     Rate and Rhythm: Normal rate. Rhythm irregular.  Pulmonary:     Effort: No respiratory distress.     Breath sounds: No wheezing.  Abdominal:     General: Bowel sounds are  normal. There is no distension.     Palpations: Abdomen is soft. There is no mass.     Tenderness: There is no abdominal tenderness. There is no guarding or rebound.  Musculoskeletal:        General: No tenderness. Normal range of motion.     Cervical back: Normal range of motion and neck supple.  Skin:    General: Skin is warm.  Neurological:     General: No focal deficit present.     Mental Status: She is alert and oriented to person, place, and time.  Psychiatric:        Mood and Affect: Affect normal.      LABORATORY DATA:  I have reviewed the data as listed Lab Results  Component Value Date   WBC 5.6 12/02/2023   HGB  13.4 12/02/2023   HCT 39.5 12/02/2023   MCV 102.1 (H) 12/02/2023   PLT 380 12/02/2023   Recent Labs    01/23/23 0222 01/23/23 0223 01/23/23 1631 01/24/23 0705 06/30/23 1444 09/01/23 1517 12/02/23 1421  NA  --    < >  --    < > 138 135 136  K  --    < >  --    < > 4.2 3.8 3.9  CL  --    < >  --    < > 106 103 105  CO2  --    < >  --    < > 26 26 26   GLUCOSE  --    < >  --    < > 96 114* 100*  BUN  --    < >  --    < > 27* 23 19  CREATININE  --    < >  --    < > 0.95 0.88 1.04*  CALCIUM   --    < >  --    < > 9.8 9.7 9.7  GFRNONAA  --    < >  --    < > >60 >60 54*  PROT 6.1*  --  5.6*   < > 6.7 7.3 6.5  ALBUMIN  2.8*   < > 2.6*   < > 4.0 4.2 4.0  AST 50*  --  53*   < > 25 26 26   ALT 35  --  43   < > 23 24 21   ALKPHOS 120  --  128*   < > 84 88 82  BILITOT 1.6*  --  1.2*   < > 1.5* 1.8* 2.0*  BILIDIR 0.5*  --  0.3*  --   --   --   --   IBILI 1.1*  --  0.9  --   --   --   --    < > = values in this interval not displayed.    RADIOGRAPHIC STUDIES: I have personally reviewed the radiological images as listed and agreed with the findings in the report. No results found.    ASSESSMENT & PLAN:   Essential thrombocythemia (HCC) # HIGH RISK Essential thrombocytosis-JAK-2 positive-November 2024 complicated  aortic/ dissection/bleeding-  #  Currently on Hydrea  500 mg a day.  Counts improved Continue current dose of hydrea  of 500 mg/day [pill box]. stable. Declines- any refills for now- will call for refills.   # CAD- A-fib NOV 2024- intramural aortic hematoma [on Eliquis ]-currently on eliquis  on 5 mg BID; OFF aspirin ;  Cardiac amyloidosis as per the note-referred cardiology-  stabe.   # Poorly controlled BP/ intramural aortic hematoma- recommend close monitoring for now- stable.   # DISPOSITION: # repeat BP # follow up in 6 month-- MD; labs- cbc/cmp;LDH;-Dr.B      All questions were answered. The patient knows to call the clinic with any problems, questions or concerns.     Cindy JONELLE Joe, MD 12/02/2023 3:37 PM

## 2023-12-02 NOTE — Progress Notes (Signed)
 Energy is up and down. Sometimes body aches from head to toe, then it goes away. She states walking and ROM exercises help.Bowels okay. Denies headaches ; has soreness on left side of face. Takes tylenol  when needed. Denies bruising or bleeding from anywhere.

## 2024-03-19 ENCOUNTER — Other Ambulatory Visit: Payer: Self-pay | Admitting: Adult Health

## 2024-04-19 ENCOUNTER — Ambulatory Visit: Admitting: Cardiovascular Disease

## 2024-06-01 ENCOUNTER — Ambulatory Visit: Admitting: Internal Medicine

## 2024-06-01 ENCOUNTER — Other Ambulatory Visit
